# Patient Record
Sex: Female | Born: 1950
Health system: Southern US, Community
[De-identification: ages and names within clinical notes are randomized; demographics above are authoritative.]

## PROBLEM LIST (undated history)

## (undated) DIAGNOSIS — F329 Major depressive disorder, single episode, unspecified: Secondary | ICD-10-CM

## (undated) DIAGNOSIS — F32A Depression, unspecified: Secondary | ICD-10-CM

## (undated) DIAGNOSIS — Z923 Personal history of irradiation: Secondary | ICD-10-CM

## (undated) DIAGNOSIS — E079 Disorder of thyroid, unspecified: Secondary | ICD-10-CM

## (undated) DIAGNOSIS — B1910 Unspecified viral hepatitis B without hepatic coma: Secondary | ICD-10-CM

## (undated) DIAGNOSIS — H269 Unspecified cataract: Secondary | ICD-10-CM

## (undated) DIAGNOSIS — E039 Hypothyroidism, unspecified: Secondary | ICD-10-CM

## (undated) DIAGNOSIS — F411 Generalized anxiety disorder: Secondary | ICD-10-CM

## (undated) DIAGNOSIS — H35379 Puckering of macula, unspecified eye: Secondary | ICD-10-CM

## (undated) DIAGNOSIS — T7840XA Allergy, unspecified, initial encounter: Secondary | ICD-10-CM

## (undated) DIAGNOSIS — E785 Hyperlipidemia, unspecified: Secondary | ICD-10-CM

## (undated) DIAGNOSIS — I10 Essential (primary) hypertension: Secondary | ICD-10-CM

## (undated) DIAGNOSIS — Z820 Family history of epilepsy and other diseases of the nervous system: Secondary | ICD-10-CM

## (undated) DIAGNOSIS — M1990 Unspecified osteoarthritis, unspecified site: Secondary | ICD-10-CM

## (undated) HISTORY — DX: Allergy, unspecified, initial encounter: T78.40XA

## (undated) HISTORY — DX: Puckering of macula, unspecified eye: H35.379

## (undated) HISTORY — DX: Unspecified viral hepatitis B without hepatic coma: B19.10

## (undated) HISTORY — DX: Disorder of thyroid, unspecified: E07.9

## (undated) HISTORY — DX: Family history of epilepsy and other diseases of the nervous system: Z82.0

## (undated) HISTORY — PX: FRACTURE SURGERY: SHX138

## (undated) HISTORY — DX: Essential (primary) hypertension: I10

## (undated) HISTORY — PX: TUBAL LIGATION: SHX77

## (undated) HISTORY — PX: ENDOMETRIAL ABLATION: SHX621

## (undated) HISTORY — DX: Major depressive disorder, single episode, unspecified: F32.9

## (undated) HISTORY — PX: CATARACT EXTRACTION: SUR2

## (undated) HISTORY — DX: Unspecified cataract: H26.9

## (undated) HISTORY — PX: APPENDECTOMY: SHX54

## (undated) HISTORY — PX: OTHER SURGICAL HISTORY: SHX169

## (undated) HISTORY — DX: Unspecified osteoarthritis, unspecified site: M19.90

## (undated) HISTORY — PX: TONSILLECTOMY AND ADENOIDECTOMY: SUR1326

## (undated) HISTORY — DX: Generalized anxiety disorder: F41.1

## (undated) HISTORY — DX: Depression, unspecified: F32.A

## (undated) HISTORY — PX: CHOLECYSTECTOMY: SHX55

## (undated) HISTORY — PX: JOINT REPLACEMENT: SHX530

## (undated) HISTORY — PX: ELBOW SURGERY: SHX618

## (undated) HISTORY — PX: FOOT SURGERY: SHX648

## (undated) HISTORY — PX: EYE SURGERY: SHX253

## (undated) HISTORY — PX: BREAST SURGERY: SHX581

## (undated) HISTORY — PX: RETINAL DETACHMENT SURGERY: SHX105

---
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
## 2007-08-08 LAB — HM COLONOSCOPY

## 2010-11-05 HISTORY — PX: RETINAL DETACHMENT SURGERY: SHX105

## 2014-04-27 ENCOUNTER — Other Ambulatory Visit: Payer: Self-pay

## 2014-04-27 ENCOUNTER — Encounter: Payer: Self-pay | Admitting: Family

## 2014-04-27 ENCOUNTER — Ambulatory Visit (INDEPENDENT_AMBULATORY_CARE_PROVIDER_SITE_OTHER): Payer: BC Managed Care – PPO | Admitting: Family

## 2014-04-27 VITALS — BP 124/80 | HR 71 | Ht 64.0 in | Wt 211.0 lb

## 2014-04-27 DIAGNOSIS — I10 Essential (primary) hypertension: Secondary | ICD-10-CM

## 2014-04-27 DIAGNOSIS — Z7189 Other specified counseling: Secondary | ICD-10-CM

## 2014-04-27 DIAGNOSIS — M159 Polyosteoarthritis, unspecified: Secondary | ICD-10-CM

## 2014-04-27 DIAGNOSIS — M150 Primary generalized (osteo)arthritis: Secondary | ICD-10-CM

## 2014-04-27 DIAGNOSIS — F411 Generalized anxiety disorder: Secondary | ICD-10-CM

## 2014-04-27 DIAGNOSIS — E039 Hypothyroidism, unspecified: Secondary | ICD-10-CM

## 2014-04-27 DIAGNOSIS — Z7689 Persons encountering health services in other specified circumstances: Secondary | ICD-10-CM

## 2014-04-27 DIAGNOSIS — M1990 Unspecified osteoarthritis, unspecified site: Secondary | ICD-10-CM | POA: Insufficient documentation

## 2014-04-27 HISTORY — DX: Essential (primary) hypertension: I10

## 2014-04-27 LAB — POCT URINALYSIS DIPSTICK
BILIRUBIN UA: NEGATIVE
Blood, UA: NEGATIVE
GLUCOSE UA: NEGATIVE
Ketones, UA: NEGATIVE
NITRITE UA: NEGATIVE
Protein, UA: NEGATIVE
Spec Grav, UA: 1.02
Urobilinogen, UA: 0.2
pH, UA: 7

## 2014-04-27 LAB — COMPREHENSIVE METABOLIC PANEL
ALT: 19 U/L (ref 0–35)
AST: 19 U/L (ref 0–37)
Albumin: 4.1 g/dL (ref 3.5–5.2)
Alkaline Phosphatase: 62 U/L (ref 39–117)
BUN: 21 mg/dL (ref 6–23)
CALCIUM: 9.1 mg/dL (ref 8.4–10.5)
CO2: 23 meq/L (ref 19–32)
CREATININE: 0.8 mg/dL (ref 0.4–1.2)
Chloride: 107 mEq/L (ref 96–112)
GFR: 81.94 mL/min (ref >60.00)
GLUCOSE: 114 mg/dL — AB (ref 70–99)
Potassium: 4 mEq/L (ref 3.5–5.1)
Sodium: 138 mEq/L (ref 135–145)
Total Bilirubin: 0.7 mg/dL (ref 0.2–1.2)
Total Protein: 6.9 g/dL (ref 6.0–8.3)

## 2014-04-27 LAB — CBC WITH DIFFERENTIAL/PLATELET
BASOS ABS: 0.1 10*3/uL (ref 0.0–0.1)
Basophils Relative: 0.8 % (ref 0.0–3.0)
Eosinophils Absolute: 0.4 10*3/uL (ref 0.0–0.7)
Eosinophils Relative: 4.2 % (ref 0.0–5.0)
HCT: 38.6 % (ref 36.0–46.0)
HEMOGLOBIN: 12.9 g/dL (ref 12.0–15.0)
LYMPHS PCT: 27 % (ref 12.0–46.0)
Lymphs Abs: 2.2 10*3/uL (ref 0.7–4.0)
MCHC: 33.5 g/dL (ref 30.0–36.0)
MCV: 87.3 fl (ref 78.0–100.0)
MONOS PCT: 5 % (ref 3.0–12.0)
Monocytes Absolute: 0.4 10*3/uL (ref 0.1–1.0)
Neutro Abs: 5.2 10*3/uL (ref 1.4–7.7)
Neutrophils Relative %: 63 % (ref 43.0–77.0)
PLATELETS: 237 10*3/uL (ref 150.0–400.0)
RBC: 4.42 Mil/uL (ref 3.87–5.11)
RDW: 12.9 % (ref 11.5–15.5)
WBC: 8.3 10*3/uL (ref 4.0–10.5)

## 2014-04-27 LAB — LIPID PANEL
CHOLESTEROL: 206 mg/dL — AB (ref 0–200)
HDL: 48.2 mg/dL (ref >39.00)
LDL Cholesterol: 129 mg/dL — ABNORMAL HIGH (ref 0–99)
NonHDL: 157.8
Total CHOL/HDL Ratio: 4
Triglycerides: 144 mg/dL (ref 0.0–149.0)
VLDL: 28.8 mg/dL (ref 0.0–40.0)

## 2014-04-27 LAB — TSH: TSH: 1.77 u[IU]/mL (ref 0.35–4.50)

## 2014-04-27 NOTE — Progress Notes (Signed)
   Subjective:    Patient ID: Karina Allen, female    DOB: 1951/09/06, 63 y.o.   MRN: [id number]  HPI  63 year old nonsmoking Caucasian female presents to establish care. She denies acute pain or injury. Her PMH includes hypothyroidism, OA, hypertension, and depression.   Review of Systems  Constitutional: Negative.   HENT: Negative.   Eyes: Negative.   Respiratory: Negative.   Cardiovascular: Negative.   Gastrointestinal: Negative.   Endocrine: Negative.   Genitourinary: Negative.   Musculoskeletal: Positive for arthralgias.  Skin: Negative.   Allergic/Immunologic: Negative.   Neurological: Negative.   Hematological: Negative.   Psychiatric/Behavioral: Negative.    Past Medical History  Diagnosis Date  . Arthritis     osteo  . Depression   . FH: migraines   . Thyroid disease   . Hepatitis B     History   Social History  . Marital Status: Married    Spouse Name: N/A    Number of Children: N/A  . Years of Education: N/A   Occupational History  . Not on file.   Social History Main Topics  . Smoking status: Never Smoker   . Smokeless tobacco: Not on file  . Alcohol Use: Yes     Comment: socially  . Drug Use: No  . Sexual Activity: Not on file   Other Topics Concern  . Not on file   Social History Narrative  . No narrative on file    Past Surgical History  Procedure Laterality Date  . Cholecystectomy    . Tonsillectomy and adenoidectomy    . Appendectomy    . Cataract extraction    . Retinal detachment surgery    . Elbow surgery      shattered elbow  . Endometrial oblation    . Tubal ligation    . Foot surgery      History reviewed. No pertinent family history.  Allergies  Allergen Reactions  . Penicillins     No current outpatient prescriptions on file prior to visit.   No current facility-administered medications on file prior to visit.    BP 124/80  Pulse 71  Ht 5\' 4"  (1.626 m)  Wt 211 lb (95.709 kg)  BMI 36.20 kg/m2      Objective:   Physical Exam  Constitutional: She is oriented to person, place, and time. She appears well-developed and well-nourished.  HENT:  Head: Normocephalic and atraumatic.  Neck: Normal range of motion.  Cardiovascular: Normal rate, regular rhythm and normal heart sounds.   Pulmonary/Chest: Effort normal and breath sounds normal.  Musculoskeletal: Normal range of motion.  Neurological: She is alert and oriented to person, place, and time.  Skin: Skin is dry.  Psychiatric: She has a normal mood and affect. Her behavior is normal. Judgment and thought content normal.       Assessment & Plan:  Karina Allen was seen today for establish care.  Diagnoses and associated orders for this visit:  Unspecified essential hypertension - CMP  Unspecified hypothyroidism - TSH - Lipid Panel  Anxiety state, unspecified  Primary osteoarthritis involving multiple joints  Encounter to establish care - Cancel: Hepatic Function Panel - CMP - CBC with Differential - Cancel: Urinalysis - POCT urinalysis dipstick    Call the office with any questions or concerns. Recheck as scheduled and as needed.

## 2014-04-27 NOTE — Patient Instructions (Signed)
Exercise to Lose Weight Exercise and a healthy diet may help you lose weight. Your doctor may suggest specific exercises. EXERCISE IDEAS AND TIPS  Choose low-cost things you enjoy doing, such as walking, bicycling, or exercising to workout videos.  Take stairs instead of the elevator.  Walk during your lunch break.  Park your car further away from work or school.  Go to a gym or an exercise class.  Start with 5 to 10 minutes of exercise each day. Build up to 30 minutes of exercise 4 to 6 days a week.  Wear shoes with good support and comfortable clothes.  Stretch before and after working out.  Work out until you breathe harder and your heart beats faster.  Drink extra water when you exercise.  Do not do so much that you hurt yourself, feel dizzy, or get very short of breath. Exercises that burn about 150 calories:  Running 1  miles in 15 minutes.  Playing volleyball for 45 to 60 minutes.  Washing and waxing a car for 45 to 60 minutes.  Playing touch football for 45 minutes.  Walking 1  miles in 35 minutes.  Pushing a stroller 1  miles in 30 minutes.  Playing basketball for 30 minutes.  Raking leaves for 30 minutes.  Bicycling 5 miles in 30 minutes.  Walking 2 miles in 30 minutes.  Dancing for 30 minutes.  Shoveling snow for 15 minutes.  Swimming laps for 20 minutes.  Walking up stairs for 15 minutes.  Bicycling 4 miles in 15 minutes.  Gardening for 30 to 45 minutes.  Jumping rope for 15 minutes.  Washing windows or floors for 45 to 60 minutes. Document Released: 11/24/2010 Document Revised: 01/14/2012 Document Reviewed: 11/24/2010 Exit[employer] Patient Information 2015 [employer]. This information is not intended to replace advice given to you by your health care provider. Make sure you discuss any questions you have with your health care provider.  

## 2014-04-27 NOTE — Progress Notes (Signed)
Pre visit review using our clinic review tool, if applicable. No additional management support is needed unless otherwise documented below in the visit note. 

## 2014-04-28 ENCOUNTER — Telehealth: Payer: Self-pay | Admitting: Family

## 2014-04-28 NOTE — Telephone Encounter (Signed)
Relevant patient education mailed to patient.  

## 2014-05-11 ENCOUNTER — Telehealth: Payer: Self-pay | Admitting: Family

## 2014-05-11 MED ORDER — MELOXICAM 15 MG PO TABS: 15.0000 mg | ORAL_TABLET | Freq: Every day | ORAL | Status: DC

## 2014-05-11 MED ORDER — LEVOTHYROXINE SODIUM 150 MCG PO TABS: 150.0000 ug | ORAL_TABLET | Freq: Every day | ORAL | Status: DC

## 2014-05-11 MED ORDER — ALPRAZOLAM 0.25 MG PO TABS: 0.2500 mg | ORAL_TABLET | Freq: Two times a day (BID) | ORAL | Status: DC | PRN

## 2014-05-11 MED ORDER — OLMESARTAN MEDOXOMIL-HCTZ 20-12.5 MG PO TABS: 1.0000 | ORAL_TABLET | Freq: Every day | ORAL | Status: DC

## 2014-05-11 NOTE — Telephone Encounter (Signed)
[facility] is requesting re-fills on the following: meloxicam (MOBIC) 15 MG tablet ALPRAZolam (XANAX) 0.25 MG tablet SYNTHROID 150 MCG tablet BENICAR HCT 20-12.5 MG per tablet

## 2014-05-11 NOTE — Telephone Encounter (Signed)
Done

## 2014-05-31 ENCOUNTER — Encounter: Payer: Self-pay | Admitting: Family

## 2014-05-31 ENCOUNTER — Ambulatory Visit (INDEPENDENT_AMBULATORY_CARE_PROVIDER_SITE_OTHER): Payer: BC Managed Care – PPO | Admitting: Family

## 2014-05-31 VITALS — BP 122/80 | HR 76 | Ht 64.0 in | Wt 219.0 lb

## 2014-05-31 DIAGNOSIS — R739 Hyperglycemia, unspecified: Secondary | ICD-10-CM

## 2014-05-31 DIAGNOSIS — Z0000 Encounter for general adult medical examination without abnormal findings: Secondary | ICD-10-CM

## 2014-05-31 DIAGNOSIS — I10 Essential (primary) hypertension: Secondary | ICD-10-CM

## 2014-05-31 DIAGNOSIS — R7309 Other abnormal glucose: Secondary | ICD-10-CM

## 2014-05-31 DIAGNOSIS — E039 Hypothyroidism, unspecified: Secondary | ICD-10-CM

## 2014-05-31 NOTE — Progress Notes (Signed)
Pre visit review using our clinic review tool, if applicable. No additional management support is needed unless otherwise documented below in the visit note. 

## 2014-05-31 NOTE — Progress Notes (Signed)
Subjective:    Patient ID: Karina Allen, female    DOB: 10-04-1951, 63 y.o.   MRN: [id number]  HPI 63 year old nonsmoker, with a history of hypertension, anxiety, hypothyroidism, osteoarthritis, and hyperglycemia. Reports doing well but not exercising. I reviewed all health maintenance protocols including mammography, colonoscopy, bone density Needed referrals were placed. Age and diagnosis  appropriate screening labs were ordered. Her immunization history was reviewed and appropriate vaccinations were ordered. Her current medications and allergies were reviewed and needed refills of her chronic medications were ordered. The plan for yearly health maintenance was discussed all orders and referrals were made as appropriate.   Review of Systems  Constitutional: Negative.   HENT: Negative.   Eyes: Negative.   Respiratory: Negative.   Cardiovascular: Negative.   Gastrointestinal: Negative.   Endocrine: Negative.   Genitourinary: Negative.   Musculoskeletal: Negative.   Skin: Negative.   Allergic/Immunologic: Negative.   Neurological: Negative.   Hematological: Negative.   Psychiatric/Behavioral: Negative.    Past Medical History  Diagnosis Date  . Arthritis     osteo  . Depression   . FH: migraines   . Thyroid disease   . Hepatitis B     History   Social History  . Marital Status: Married    Spouse Name: N/A    Number of Children: N/A  . Years of Education: N/A   Occupational History  . Not on file.   Social History Main Topics  . Smoking status: Never Smoker   . Smokeless tobacco: Not on file  . Alcohol Use: Yes     Comment: socially  . Drug Use: No  . Sexual Activity: Not on file   Other Topics Concern  . Not on file   Social History Narrative  . No narrative on file    Past Surgical History  Procedure Laterality Date  . Cholecystectomy    . Tonsillectomy and adenoidectomy    . Appendectomy    . Cataract extraction    . Retinal detachment surgery    .  Elbow surgery      shattered elbow  . Endometrial oblation    . Tubal ligation    . Foot surgery      No family history on file.  Allergies  Allergen Reactions  . Penicillins     Current Outpatient Prescriptions on File Prior to Visit  Medication Sig Dispense Refill  . ALPRAZolam (XANAX) 0.25 MG tablet Take 1 tablet (0.25 mg total) by mouth 2 (two) times daily as needed.  30 tablet  0  . levothyroxine (SYNTHROID) 150 MCG tablet Take 1 tablet (150 mcg total) by mouth daily before breakfast.  90 tablet  1  . meloxicam (MOBIC) 15 MG tablet Take 1 tablet (15 mg total) by mouth daily.  90 tablet  1  . olmesartan-hydrochlorothiazide (BENICAR HCT) 20-12.5 MG per tablet Take 1 tablet by mouth daily.  90 tablet  1   No current facility-administered medications on file prior to visit.    BP 122/80  Pulse 76  Ht 5\' 4"  (1.626 m)  Wt 219 lb (99.338 kg)  BMI 37.57 kg/m2chart    Objective:   Physical Exam  Constitutional: She is oriented to person, place, and time. She appears well-developed and well-nourished.  HENT:  Head: Normocephalic and atraumatic.  Right Ear: External ear normal.  Left Ear: External ear normal.  Nose: Nose normal.  Mouth/Throat: Oropharynx is clear and moist.  Eyes: Conjunctivae and EOM are normal. Pupils are  equal, round, and reactive to light.  Neck: Normal range of motion. Neck supple.  Cardiovascular: Normal rate, regular rhythm and normal heart sounds.   Pulmonary/Chest: Effort normal and breath sounds normal.  Abdominal: Soft. Bowel sounds are normal.  Musculoskeletal: Normal range of motion. She exhibits no edema and no tenderness.  Neurological: She is alert and oriented to person, place, and time. She has normal reflexes. She displays normal reflexes. No cranial nerve deficit. Coordination normal.  Skin: Skin is warm and dry.  Psychiatric: She has a normal mood and affect.          Assessment & Plan:  Rayn was seen today for annual  exam.  Diagnoses and associated orders for this visit:  Routine general medical examination at a health care facility - EKG 12-Lead  Hyperglycemia  Unspecified hypothyroidism  Unspecified essential hypertension   Return for a recheck of hyperglycemia. Encouraged a healthy diet, exercise, weight reduction.

## 2014-05-31 NOTE — Patient Instructions (Signed)
Exercise to Lose Weight Exercise and a healthy diet may help you lose weight. Your doctor may suggest specific exercises. EXERCISE IDEAS AND TIPS  Choose low-cost things you enjoy doing, such as walking, bicycling, or exercising to workout videos.  Take stairs instead of the elevator.  Walk during your lunch break.  Park your car further away from work or school.  Go to a gym or an exercise class.  Start with 5 to 10 minutes of exercise each day. Build up to 30 minutes of exercise 4 to 6 days a week.  Wear shoes with good support and comfortable clothes.  Stretch before and after working out.  Work out until you breathe harder and your heart beats faster.  Drink extra water when you exercise.  Do not do so much that you hurt yourself, feel dizzy, or get very short of breath. Exercises that burn about 150 calories:  Running 1  miles in 15 minutes.  Playing volleyball for 45 to 60 minutes.  Washing and waxing a car for 45 to 60 minutes.  Playing touch football for 45 minutes.  Walking 1  miles in 35 minutes.  Pushing a stroller 1  miles in 30 minutes.  Playing basketball for 30 minutes.  Raking leaves for 30 minutes.  Bicycling 5 miles in 30 minutes.  Walking 2 miles in 30 minutes.  Dancing for 30 minutes.  Shoveling snow for 15 minutes.  Swimming laps for 20 minutes.  Walking up stairs for 15 minutes.  Bicycling 4 miles in 15 minutes.  Gardening for 30 to 45 minutes.  Jumping rope for 15 minutes.  Washing windows or floors for 45 to 60 minutes. Document Released: 11/24/2010 Document Revised: 01/14/2012 Document Reviewed: 11/24/2010 Exit[employer] Patient Information 2015 [employer]. This information is not intended to replace advice given to you by your health care provider. Make sure you discuss any questions you have with your health care provider.  

## 2014-06-30 ENCOUNTER — Telehealth: Payer: Self-pay

## 2014-06-30 MED ORDER — ALPRAZOLAM 0.25 MG PO TABS: 0.2500 mg | ORAL_TABLET | Freq: Two times a day (BID) | ORAL | Status: DC | PRN

## 2014-06-30 NOTE — Telephone Encounter (Signed)
Refill request for Xanax 0.25mg . Last filled 05/11/14. Ok to fill?

## 2014-06-30 NOTE — Telephone Encounter (Signed)
Pt is aware and verbalized understanding.

## 2014-06-30 NOTE — Telephone Encounter (Signed)
Ok to refill but advise patient this medication is only meant to be used sparingly.

## 2014-10-01 ENCOUNTER — Ambulatory Visit: Payer: BC Managed Care – PPO | Admitting: Family

## 2014-10-12 ENCOUNTER — Ambulatory Visit (INDEPENDENT_AMBULATORY_CARE_PROVIDER_SITE_OTHER): Payer: BC Managed Care – PPO | Admitting: Family

## 2014-10-12 ENCOUNTER — Encounter: Payer: Self-pay | Admitting: Family

## 2014-10-12 VITALS — BP 126/84 | HR 88 | Wt 224.8 lb

## 2014-10-12 DIAGNOSIS — Z1231 Encounter for screening mammogram for malignant neoplasm of breast: Secondary | ICD-10-CM

## 2014-10-12 DIAGNOSIS — I10 Essential (primary) hypertension: Secondary | ICD-10-CM

## 2014-10-12 DIAGNOSIS — Z1239 Encounter for other screening for malignant neoplasm of breast: Secondary | ICD-10-CM

## 2014-10-12 DIAGNOSIS — M1710 Unilateral primary osteoarthritis, unspecified knee: Secondary | ICD-10-CM

## 2014-10-12 DIAGNOSIS — E039 Hypothyroidism, unspecified: Secondary | ICD-10-CM

## 2014-10-12 DIAGNOSIS — R739 Hyperglycemia, unspecified: Secondary | ICD-10-CM

## 2014-10-12 DIAGNOSIS — F411 Generalized anxiety disorder: Secondary | ICD-10-CM

## 2014-10-12 DIAGNOSIS — IMO0002 Reserved for concepts with insufficient information to code with codable children: Secondary | ICD-10-CM

## 2014-10-12 DIAGNOSIS — M179 Osteoarthritis of knee, unspecified: Secondary | ICD-10-CM

## 2014-10-12 LAB — HEMOGLOBIN A1C: Hgb A1c MFr Bld: 6.2 % (ref 4.6–6.5)

## 2014-10-12 LAB — BASIC METABOLIC PANEL
BUN: 13 mg/dL (ref 6–23)
CHLORIDE: 103 meq/L (ref 96–112)
CO2: 24 meq/L (ref 19–32)
Calcium: 9.3 mg/dL (ref 8.4–10.5)
Creatinine, Ser: 0.8 mg/dL (ref 0.4–1.2)
GFR: 72.89 mL/min (ref >60.00)
GLUCOSE: 108 mg/dL — AB (ref 70–99)
POTASSIUM: 3.8 meq/L (ref 3.5–5.1)
Sodium: 136 mEq/L (ref 135–145)

## 2014-10-12 LAB — CBC WITH DIFFERENTIAL/PLATELET
BASOS ABS: 0.1 10*3/uL (ref 0.0–0.1)
BASOS PCT: 1.1 % (ref 0.0–3.0)
Eosinophils Absolute: 0.3 10*3/uL (ref 0.0–0.7)
Eosinophils Relative: 4 % (ref 0.0–5.0)
HCT: 38.6 % (ref 36.0–46.0)
HEMOGLOBIN: 13.1 g/dL (ref 12.0–15.0)
LYMPHS PCT: 23.9 % (ref 12.0–46.0)
Lymphs Abs: 1.9 10*3/uL (ref 0.7–4.0)
MCHC: 33.9 g/dL (ref 30.0–36.0)
MCV: 85.3 fl (ref 78.0–100.0)
MONO ABS: 0.4 10*3/uL (ref 0.1–1.0)
Monocytes Relative: 5.2 % (ref 3.0–12.0)
NEUTROS ABS: 5.1 10*3/uL (ref 1.4–7.7)
NEUTROS PCT: 65.8 % (ref 43.0–77.0)
Platelets: 264 10*3/uL (ref 150.0–400.0)
RBC: 4.53 Mil/uL (ref 3.87–5.11)
RDW: 13.3 % (ref 11.5–15.5)
WBC: 7.8 10*3/uL (ref 4.0–10.5)

## 2014-10-12 LAB — HEPATIC FUNCTION PANEL
ALBUMIN: 3.9 g/dL (ref 3.5–5.2)
ALK PHOS: 68 U/L (ref 39–117)
ALT: 23 U/L (ref 0–35)
AST: 22 U/L (ref 0–37)
Bilirubin, Direct: 0.1 mg/dL (ref 0.0–0.3)
TOTAL PROTEIN: 6.7 g/dL (ref 6.0–8.3)
Total Bilirubin: 0.7 mg/dL (ref 0.2–1.2)

## 2014-10-12 LAB — TSH: TSH: 3.24 u[IU]/mL (ref 0.35–4.50)

## 2014-10-12 MED ORDER — LORAZEPAM 0.5 MG PO TABS: 0.5000 mg | ORAL_TABLET | Freq: Two times a day (BID) | ORAL | Status: DC | PRN

## 2014-10-12 NOTE — Patient Instructions (Signed)
Exercise to Lose Weight Exercise and a healthy diet may help you lose weight. Your doctor may suggest specific exercises. EXERCISE IDEAS AND TIPS  Choose low-cost things you enjoy doing, such as walking, bicycling, or exercising to workout videos.  Take stairs instead of the elevator.  Walk during your lunch break.  Park your car further away from work or school.  Go to a gym or an exercise class.  Start with 5 to 10 minutes of exercise each day. Build up to 30 minutes of exercise 4 to 6 days a week.  Wear shoes with good support and comfortable clothes.  Stretch before and after working out.  Work out until you breathe harder and your heart beats faster.  Drink extra water when you exercise.  Do not do so much that you hurt yourself, feel dizzy, or get very short of breath. Exercises that burn about 150 calories:  Running 1  miles in 15 minutes.  Playing volleyball for 45 to 60 minutes.  Washing and waxing a car for 45 to 60 minutes.  Playing touch football for 45 minutes.  Walking 1  miles in 35 minutes.  Pushing a stroller 1  miles in 30 minutes.  Playing basketball for 30 minutes.  Raking leaves for 30 minutes.  Bicycling 5 miles in 30 minutes.  Walking 2 miles in 30 minutes.  Dancing for 30 minutes.  Shoveling snow for 15 minutes.  Swimming laps for 20 minutes.  Walking up stairs for 15 minutes.  Bicycling 4 miles in 15 minutes.  Gardening for 30 to 45 minutes.  Jumping rope for 15 minutes.  Washing windows or floors for 45 to 60 minutes. Document Released: 11/24/2010 Document Revised: 01/14/2012 Document Reviewed: 11/24/2010 Exit[employer] Patient Information 2015 [employer]. This information is not intended to replace advice given to you by your health care provider. Make sure you discuss any questions you have with your health care provider.  

## 2014-10-12 NOTE — Progress Notes (Signed)
Subjective:    Patient ID: Karina Allen, female    DOB: [birth year], 63 y.o.   MRN: [id number]  HPI 63 year old white female, obese with a history of hypertension, hyperglycemia, anxiety, and osteoarthritis is in today for recheck. Reports doing well. Has gained 5 pounds from her last office visit. Denies any physical exercise. Tolerates medications well. Reports increased stress and periodically takes Xanax as needed. Mother was recently in the hospital after an ablation that did not go well.   Review of Systems  Constitutional: Negative.   HENT: Negative.   Respiratory: Negative.   Cardiovascular: Negative.   Gastrointestinal: Negative.   Endocrine: Negative.   Genitourinary: Negative.   Musculoskeletal: Positive for arthralgias. Negative for back pain.  Skin: Negative.   Neurological: Negative.   Hematological: Negative.   Psychiatric/Behavioral: Negative.    Past Medical History  Diagnosis Date  . Arthritis     osteo  . Depression   . FH: migraines   . Thyroid disease   . Hepatitis B     History   Social History  . Marital Status: Married    Spouse Name: N/A    Number of Children: N/A  . Years of Education: N/A   Occupational History  . Not on file.   Social History Main Topics  . Smoking status: Never Smoker   . Smokeless tobacco: Not on file  . Alcohol Use: Yes     Comment: socially  . Drug Use: No  . Sexual Activity: Not on file   Other Topics Concern  . Not on file   Social History Narrative    Past Surgical History  Procedure Laterality Date  . Cholecystectomy    . Tonsillectomy and adenoidectomy    . Appendectomy    . Cataract extraction    . Retinal detachment surgery    . Elbow surgery      shattered elbow  . Endometrial oblation    . Tubal ligation    . Foot surgery      No family history on file.  Allergies  Allergen Reactions  . Penicillins     Current Outpatient Prescriptions on File Prior to Visit  Medication Sig Dispense  Refill  . levothyroxine (SYNTHROID) 150 MCG tablet Take 1 tablet (150 mcg total) by mouth daily before breakfast. 90 tablet 1  . meloxicam (MOBIC) 15 MG tablet Take 1 tablet (15 mg total) by mouth daily. 90 tablet 1  . olmesartan-hydrochlorothiazide (BENICAR HCT) 20-12.5 MG per tablet Take 1 tablet by mouth daily. 90 tablet 1   No current facility-administered medications on file prior to visit.    BP 126/84 mmHg  Pulse 88  Wt 224 lb 12.8 oz (101.969 kg)chart    Objective:   Physical Exam  Constitutional: She is oriented to person, place, and time. She appears well-developed and well-nourished.  HENT:  Right Ear: External ear normal.  Left Ear: External ear normal.  Nose: Nose normal.  Mouth/Throat: Oropharynx is clear and moist.  Neck: Normal range of motion. Neck supple. No thyromegaly present.  Cardiovascular: Normal rate and normal heart sounds.   Pulmonary/Chest: Effort normal and breath sounds normal.  Abdominal: Soft. Bowel sounds are normal.  Musculoskeletal: Normal range of motion.  Neurological: She is alert and oriented to person, place, and time.  Skin: Skin is warm and dry.  Psychiatric: She has a normal mood and affect.          Assessment & Plan:  Dereon was seen today for follow-up.  Diagnoses and associated orders for this visit:  Hypothyroidism, unspecified hypothyroidism type - TSH - Basic Metabolic Panel - Hepatic Function Panel - CBC with Differential  Osteoarthrosis, unspecified whether generalized or localized, involving lower leg - CBC with Differential  Essential hypertension - Basic Metabolic Panel - Hepatic Function Panel  Hyperglycemia - Hemoglobin A1c - CBC with Differential  Breast cancer screening, high risk patient - MM Digital Screening; Future  Generalized anxiety disorder  Other Orders - LORazepam (ATIVAN) 0.5 MG tablet; Take 1 tablet (0.5 mg total) by mouth 2 (two) times daily as needed for anxiety.    Encouraged  healthy diet, exercise and weight reduction. The goal is to lose 15 pounds over the next 4 months. Discontinue Xanax.Start Lorazepam due to the potential for long-term dependence. Patient aware that this risk also exists with lorazepam. If she needs the medication more than once a day we will have to consider an SSRI.

## 2014-10-12 NOTE — Progress Notes (Signed)
Pre visit review using our clinic review tool, if applicable. No additional management support is needed unless otherwise documented below in the visit note. 

## 2014-10-14 ENCOUNTER — Telehealth: Payer: Self-pay | Admitting: Family

## 2014-10-14 NOTE — Telephone Encounter (Signed)
emmi emailed °

## 2014-11-03 ENCOUNTER — Other Ambulatory Visit: Payer: Self-pay | Admitting: Family

## 2014-11-16 ENCOUNTER — Other Ambulatory Visit: Payer: Self-pay | Admitting: Family

## 2014-11-22 ENCOUNTER — Ambulatory Visit
Admission: RE | Admit: 2014-11-22 | Discharge: 2014-11-22 | Disposition: A | Discharge status: Home / Self Care | Payer: BC Managed Care – PPO | Source: Ambulatory Visit | Attending: Family | Admitting: Family

## 2014-11-22 DIAGNOSIS — Z1239 Encounter for other screening for malignant neoplasm of breast: Secondary | ICD-10-CM

## 2014-11-24 ENCOUNTER — Other Ambulatory Visit: Payer: Self-pay | Admitting: Family

## 2014-11-24 DIAGNOSIS — R928 Other abnormal and inconclusive findings on diagnostic imaging of breast: Secondary | ICD-10-CM

## 2014-12-01 ENCOUNTER — Ambulatory Visit
Admission: RE | Admit: 2014-12-01 | Discharge: 2014-12-01 | Disposition: A | Discharge status: Home / Self Care | Payer: BC Managed Care – PPO | Source: Ambulatory Visit | Attending: Family | Admitting: Family

## 2014-12-01 DIAGNOSIS — R928 Other abnormal and inconclusive findings on diagnostic imaging of breast: Secondary | ICD-10-CM

## 2015-02-14 ENCOUNTER — Ambulatory Visit: Payer: BC Managed Care – PPO | Admitting: Family

## 2015-02-15 ENCOUNTER — Other Ambulatory Visit: Payer: Self-pay | Admitting: Family

## 2015-02-24 ENCOUNTER — Ambulatory Visit (INDEPENDENT_AMBULATORY_CARE_PROVIDER_SITE_OTHER): Payer: BC Managed Care – PPO | Admitting: Family

## 2015-02-24 ENCOUNTER — Encounter: Payer: Self-pay | Admitting: Family

## 2015-02-24 VITALS — BP 120/84 | HR 85 | Temp 97.6°F | Wt 230.0 lb

## 2015-02-24 DIAGNOSIS — E7800 Pure hypercholesterolemia, unspecified: Secondary | ICD-10-CM

## 2015-02-24 DIAGNOSIS — E039 Hypothyroidism, unspecified: Secondary | ICD-10-CM | POA: Diagnosis not present

## 2015-02-24 DIAGNOSIS — R739 Hyperglycemia, unspecified: Secondary | ICD-10-CM | POA: Diagnosis not present

## 2015-02-24 DIAGNOSIS — F411 Generalized anxiety disorder: Secondary | ICD-10-CM | POA: Diagnosis not present

## 2015-02-24 DIAGNOSIS — I10 Essential (primary) hypertension: Secondary | ICD-10-CM | POA: Diagnosis not present

## 2015-02-24 LAB — LIPID PANEL
Cholesterol: 188 mg/dL (ref 0–200)
HDL: 41.8 mg/dL (ref >39.00)
LDL Cholesterol: 114 mg/dL — ABNORMAL HIGH (ref 0–99)
NonHDL: 146.2
Total CHOL/HDL Ratio: 4
Triglycerides: 163 mg/dL — ABNORMAL HIGH (ref 0.0–149.0)
VLDL: 32.6 mg/dL (ref 0.0–40.0)

## 2015-02-24 LAB — BASIC METABOLIC PANEL
BUN: 16 mg/dL (ref 6–23)
CALCIUM: 9.4 mg/dL (ref 8.4–10.5)
CO2: 25 mEq/L (ref 19–32)
Chloride: 106 mEq/L (ref 96–112)
Creatinine, Ser: 1.02 mg/dL (ref 0.40–1.20)
GFR: 58.19 mL/min — AB (ref >60.00)
GLUCOSE: 110 mg/dL — AB (ref 70–99)
POTASSIUM: 4.1 meq/L (ref 3.5–5.1)
Sodium: 138 mEq/L (ref 135–145)

## 2015-02-24 LAB — HEPATIC FUNCTION PANEL
ALK PHOS: 66 U/L (ref 39–117)
ALT: 22 U/L (ref 0–35)
AST: 21 U/L (ref 0–37)
Albumin: 3.9 g/dL (ref 3.5–5.2)
BILIRUBIN DIRECT: 0.1 mg/dL (ref 0.0–0.3)
BILIRUBIN TOTAL: 0.4 mg/dL (ref 0.2–1.2)
Total Protein: 6.7 g/dL (ref 6.0–8.3)

## 2015-02-24 LAB — HEMOGLOBIN A1C: Hgb A1c MFr Bld: 5.8 % (ref 4.6–6.5)

## 2015-02-24 LAB — TSH: TSH: 1.83 u[IU]/mL (ref 0.35–4.50)

## 2015-02-24 NOTE — Patient Instructions (Signed)
Exercise to Lose Weight Exercise and a healthy diet may help you lose weight. Your doctor may suggest specific exercises. EXERCISE IDEAS AND TIPS  Choose low-cost things you enjoy doing, such as walking, bicycling, or exercising to workout videos.  Take stairs instead of the elevator.  Walk during your lunch break.  Park your car further away from work or school.  Go to a gym or an exercise class.  Start with 5 to 10 minutes of exercise each day. Build up to 30 minutes of exercise 4 to 6 days a week.  Wear shoes with good support and comfortable clothes.  Stretch before and after working out.  Work out until you breathe harder and your heart beats faster.  Drink extra water when you exercise.  Do not do so much that you hurt yourself, feel dizzy, or get very short of breath. Exercises that burn about 150 calories:  Running 1  miles in 15 minutes.  Playing volleyball for 45 to 60 minutes.  Washing and waxing a car for 45 to 60 minutes.  Playing touch football for 45 minutes.  Walking 1  miles in 35 minutes.  Pushing a stroller 1  miles in 30 minutes.  Playing basketball for 30 minutes.  Raking leaves for 30 minutes.  Bicycling 5 miles in 30 minutes.  Walking 2 miles in 30 minutes.  Dancing for 30 minutes.  Shoveling snow for 15 minutes.  Swimming laps for 20 minutes.  Walking up stairs for 15 minutes.  Bicycling 4 miles in 15 minutes.  Gardening for 30 to 45 minutes.  Jumping rope for 15 minutes.  Washing windows or floors for 45 to 60 minutes. Document Released: 11/24/2010 Document Revised: 01/14/2012 Document Reviewed: 11/24/2010 Exit[employer] Patient Information 2015 [employer]. This information is not intended to replace advice given to you by your health care provider. Make sure you discuss any questions you have with your health care provider.  

## 2015-02-24 NOTE — Progress Notes (Signed)
Pre visit review using our clinic review tool, if applicable. No additional management support is needed unless otherwise documented below in the visit note. 

## 2015-02-24 NOTE — Progress Notes (Signed)
Subjective:    Patient ID: Karina Allen, female    DOB: January 11, 1951, 64 y.o.   MRN: [id number]  HPI  64 year old white female with a history of hyperglycemia, hypothyroidism, anxiety, osteoarthritis, and obesity is in today for recheck. She is up 15 pounds. Patient feels that meloxicam has contributed to her weight gain. She stopped the medication. Has been taken ibuprofen that works well. Reports walking 2 miles per day. Reports a healthy diet.  Review of Systems  Constitutional: Positive for unexpected weight change.       Weight gain   HENT: Negative.   Eyes: Negative.   Respiratory: Negative.   Cardiovascular: Negative.   Gastrointestinal: Negative.   Endocrine: Negative.   Genitourinary: Negative.   Musculoskeletal: Negative.   Skin: Negative.   Allergic/Immunologic: Negative.   Neurological: Negative.   Hematological: Negative.   Psychiatric/Behavioral: Negative.    Past Medical History  Diagnosis Date  . Arthritis     osteo  . Depression   . FH: migraines   . Thyroid disease   . Hepatitis B     History   Social History  . Marital Status: Married    Spouse Name: N/A  . Number of Children: N/A  . Years of Education: N/A   Occupational History  . Not on file.   Social History Main Topics  . Smoking status: Never Smoker   . Smokeless tobacco: Not on file  . Alcohol Use: Yes     Comment: socially  . Drug Use: No  . Sexual Activity: Not on file   Other Topics Concern  . Not on file   Social History Narrative    Past Surgical History  Procedure Laterality Date  . Cholecystectomy    . Tonsillectomy and adenoidectomy    . Appendectomy    . Cataract extraction    . Retinal detachment surgery    . Elbow surgery      shattered elbow  . Endometrial oblation    . Tubal ligation    . Foot surgery      No family history on file.  Allergies  Allergen Reactions  . Penicillins     Current Outpatient Prescriptions on File Prior to Visit  Medication  Sig Dispense Refill  . BENICAR HCT 20-12.5 MG per tablet TAKE 1 TABLET BY MOUTH ONCE DAILY. 90 tablet 1  . LORazepam (ATIVAN) 0.5 MG tablet Take 1 tablet (0.5 mg total) by mouth 2 (two) times daily as needed for anxiety. 60 tablet 1  . SYNTHROID 150 MCG tablet TAKE 1 TABLET (150 MCG TOTAL) BY MOUTH ONCE DAILY BEFORE BREAKFAST. 90 tablet 0   No current facility-administered medications on file prior to visit.    BP 120/84 mmHg  Pulse 85  Temp(Src) 97.6 F (36.4 C) (Oral)  Wt 230 lb (104.327 kg)chart     Objective:   Physical Exam  Constitutional: She is oriented to person, place, and time. She appears well-developed and well-nourished.  HENT:  Right Ear: External ear normal.  Left Ear: External ear normal.  Nose: Nose normal.  Mouth/Throat: Oropharynx is clear and moist.  Eyes: Conjunctivae are normal. Pupils are equal, round, and reactive to light.  Neck: Normal range of motion. Neck supple. No thyromegaly present.  Cardiovascular: Normal rate, regular rhythm and normal heart sounds.   Pulmonary/Chest: Effort normal and breath sounds normal.  Abdominal: Soft. Bowel sounds are normal.  Musculoskeletal: Normal range of motion.  Neurological: She is alert and oriented to person, place, and  time. She has normal reflexes.  Skin: Skin is warm and dry.  Psychiatric: She has a normal mood and affect.          Assessment & Plan:  Karina Allen was seen today for follow-up.  Diagnoses and all orders for this visit:  Hypothyroidism, unspecified hypothyroidism type Orders: -     TSH -     Basic Metabolic Panel  Essential hypertension Orders: -     Basic Metabolic Panel  Generalized anxiety disorder  Hyperglycemia Orders: -     Basic Metabolic Panel -     Hemoglobin A1c  Pure hypercholesterolemia Orders: -     Lipid Panel -     Basic Metabolic Panel -     Hepatic Function Panel   Encouraged healthy diet and exercise. Continue current medications. Labs obtained today will  follow-up pending results.

## 2015-03-18 ENCOUNTER — Other Ambulatory Visit: Payer: Self-pay | Admitting: Family

## 2015-04-19 ENCOUNTER — Telehealth: Payer: Self-pay | Admitting: Family

## 2015-04-19 NOTE — Telephone Encounter (Signed)
Padonda, please advise which medication pt should switch to if she is comfortable with the switch

## 2015-04-19 NOTE — Telephone Encounter (Signed)
I called to submit PA for Benicar HCT and was advised Losartan HCT, Valsartan HCT and Telmisartan HCT are preferred medications. Please advise.

## 2015-04-19 NOTE — Telephone Encounter (Signed)
MyChart message sent to pt concerning Benicar

## 2015-04-21 NOTE — Telephone Encounter (Signed)
Patient stated she was not able to tolerate generic several years ago and wants to stay on Benicar HCT.  PA was submitted and approved till 04/20/16.

## 2015-05-20 ENCOUNTER — Other Ambulatory Visit: Payer: Self-pay | Admitting: Family

## 2015-05-20 NOTE — Telephone Encounter (Signed)
Rx sent in

## 2015-06-21 ENCOUNTER — Other Ambulatory Visit: Payer: Self-pay | Admitting: Family

## 2015-07-20 ENCOUNTER — Ambulatory Visit: Payer: BC Managed Care – PPO | Admitting: Family Medicine

## 2015-08-04 ENCOUNTER — Ambulatory Visit (INDEPENDENT_AMBULATORY_CARE_PROVIDER_SITE_OTHER): Payer: BC Managed Care – PPO | Admitting: Family Medicine

## 2015-08-04 ENCOUNTER — Encounter: Payer: Self-pay | Admitting: Family Medicine

## 2015-08-04 VITALS — BP 116/78 | HR 80 | Temp 97.5°F | Ht 64.0 in | Wt 224.4 lb

## 2015-08-04 DIAGNOSIS — Z7189 Other specified counseling: Secondary | ICD-10-CM

## 2015-08-04 DIAGNOSIS — E7800 Pure hypercholesterolemia, unspecified: Secondary | ICD-10-CM

## 2015-08-04 DIAGNOSIS — I10 Essential (primary) hypertension: Secondary | ICD-10-CM

## 2015-08-04 DIAGNOSIS — F411 Generalized anxiety disorder: Secondary | ICD-10-CM | POA: Insufficient documentation

## 2015-08-04 DIAGNOSIS — E038 Other specified hypothyroidism: Secondary | ICD-10-CM

## 2015-08-04 DIAGNOSIS — R739 Hyperglycemia, unspecified: Secondary | ICD-10-CM

## 2015-08-04 DIAGNOSIS — Z7689 Persons encountering health services in other specified circumstances: Secondary | ICD-10-CM

## 2015-08-04 DIAGNOSIS — M170 Bilateral primary osteoarthritis of knee: Secondary | ICD-10-CM

## 2015-08-04 DIAGNOSIS — E669 Obesity, unspecified: Secondary | ICD-10-CM

## 2015-08-04 HISTORY — DX: Generalized anxiety disorder: F41.1

## 2015-08-04 LAB — BASIC METABOLIC PANEL
BUN: 15 mg/dL (ref 6–23)
CHLORIDE: 102 meq/L (ref 96–112)
CO2: 30 mEq/L (ref 19–32)
CREATININE: 0.81 mg/dL (ref 0.40–1.20)
Calcium: 9.4 mg/dL (ref 8.4–10.5)
GFR: 75.82 mL/min (ref >60.00)
Glucose, Bld: 103 mg/dL — ABNORMAL HIGH (ref 70–99)
Potassium: 3.8 mEq/L (ref 3.5–5.1)
Sodium: 138 mEq/L (ref 135–145)

## 2015-08-04 LAB — LIPID PANEL
CHOLESTEROL: 206 mg/dL — AB (ref 0–200)
HDL: 46.4 mg/dL (ref >39.00)
LDL CALC: 127 mg/dL — AB (ref 0–99)
NONHDL: 159.31
Total CHOL/HDL Ratio: 4
Triglycerides: 163 mg/dL — ABNORMAL HIGH (ref 0.0–149.0)
VLDL: 32.6 mg/dL (ref 0.0–40.0)

## 2015-08-04 LAB — TSH: TSH: 1.89 u[IU]/mL (ref 0.35–4.50)

## 2015-08-04 LAB — HEMOGLOBIN A1C: Hgb A1c MFr Bld: 5.9 % (ref 4.6–6.5)

## 2015-08-04 MED ORDER — LORAZEPAM 0.5 MG PO TABS: 0.5000 mg | ORAL_TABLET | Freq: Two times a day (BID) | ORAL | Status: DC | PRN

## 2015-08-04 NOTE — Patient Instructions (Addendum)
BEFORE YOU LEAVE: -labs -schedule follow up in 4 months  Please call to set up counseling regarding the anxiety  -We placed a referral for you as discussed per your request to the orthopedic doctor regarding your knee pain. It usually takes about 1-2 weeks to process and schedule this referral. If you have not heard from [place] regarding this appointment in 2 weeks please contact our office.  We recommend the following healthy lifestyle measures: - eat a healthy whole foods diet consisting of regular small meals composed of vegetables, fruits, beans, nuts, seeds, healthy meats such as white chicken and fish and whole grains.  - avoid sweets, white starchy foods, fried foods, fast food, processed foods, sodas, red meet and other fattening foods.  - get a least 150-300 minutes of aerobic exercise per week.   -We have ordered labs or studies at this visit. It can take up to 1-2 weeks for results and processing. We will contact you with instructions IF your results are abnormal. Normal results will be released to your [facility]. If you have not heard from [place] or can not find your results in [facility] in 2 weeks please contact our office.   -please let us know the name of the gastroenterologist who did your colonosocpy

## 2015-08-04 NOTE — Progress Notes (Signed)
HPI:  Karina Allen is here to establish care. Used to see [employer].  Has the following chronic problems that require follow up and concerns today:  HTN: -meds: benicar-hctz -stable  HLD/Obesity/Prediabetes: -has made some changes in lifestyle -walking; trying to eat healthier, feels has lost a ew pounds  Hypothyroidism: -denies hx of RIA or thyroid surgery -meds: synthroid -stable  Bilateral knee pain/OA: -reports prior PCP did eval > 5 years ago and she was told has sig arthritis of the knees -she has tried various daily nsaids for this -she is adamant about seeing an orthopedic specialist regarding this and requests referral today -denies: fall, weakness, numbness, malaise, fevers  GAD: -worries a little about everything, always -occ panic attacks and request ativan refill for this - understands risks of benzos and reports uses very infrequently -has seen her pastor about this but has not done formal CBT -denies current depression but has hx of this   ROS negative for unless reported above: fevers, unintentional weight loss, hearing or vision loss, chest pain, palpitations, struggling to breath, hemoptysis, melena, hematochezia, hematuria, falls, loc, si, thoughts of self harm  Past Medical History  Diagnosis Date  . Arthritis     osteo  . Depression   . FH: migraines   . Thyroid disease   . Hepatitis B     reports dx and tx as a young child, reports no issues since and retested as adult and told did not have hepatitis, she is unsure of type of hep she had  . Essential hypertension 04/27/2014  . GAD (generalized anxiety disorder) 08/04/2015    Past Surgical History  Procedure Laterality Date  . Cholecystectomy    . Tonsillectomy and adenoidectomy    . Appendectomy    . Cataract extraction    . Retinal detachment surgery    . Elbow surgery      shattered elbow  . Endometrial oblation    . Tubal ligation    . Foot surgery      History reviewed. No  pertinent family history.  Social History   Social History  . Marital Status: Married    Spouse Name: N/A  . Number of Children: N/A  . Years of Education: N/A   Social History Main Topics  . Smoking status: Never Smoker   . Smokeless tobacco: None  . Alcohol Use: Yes     Comment: socially  . Drug Use: No  . Sexual Activity: Not Asked   Other Topics Concern  . 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  BENICAR HCT 20-12.5 MG per tablet, TAKE 1 TABLET BY MOUTH ONCE DAILY., Disp: 30 tablet, Rfl: 1 .  LORazepam (ATIVAN) 0.5 MG tablet, Take 1 tablet (0.5 mg total) by mouth 2 (two) times daily as needed. for anxiety, Disp: 20 tablet, Rfl: 0 .  SYNTHROID 150 MCG tablet, TAKE 1 TABLET (150 MCG TOTAL) BY MOUTH ONCE DAILY BEFORE BREAKFAST., Disp: 30 tablet, Rfl: 1  EXAM:  Filed Vitals:   08/04/15 1335  BP: 116/78  Pulse: 80  Temp: 97.5 F (36.4 C)    Body mass index is 38.5 kg/(m^2).  GENERAL: vitals reviewed and listed  above, alert, oriented, appears well hydrated and in no acute distress  HEENT: atraumatic, conjunttiva clear, no obvious abnormalities on inspection of external nose and ears  NECK: no obvious masses on inspection  LUNGS: clear to auscultation bilaterally, no wheezes, rales or rhonchi, good air movement  CV: HRRR, no peripheral edema  MS: moves all extremities without noticeable abnormality  PSYCH: pleasant and cooperative, no obvious depression or anxiety  ASSESSMENT AND PLAN:  Discussed the following assessment and plan:  Essential hypertension - Plan: Basic metabolic panel -cont current tx  Other specified hypothyroidism - Plan: TSH -cont scurrent tx  GAD (generalized anxiety disorder) -advised CBT,  discussed risks with benzos, small amount refilled for occ panic attacks  Hyperglycemia/Obesity - Plan: Hemoglobin A1c -recheck today -lifestyle recs  Pure hypercholesterolemia - Plan: Lipid Panel -lifestyle recs  Primary osteoarthritis of both knees - Plan: Ambulatory referral to Orthopedic Surgery -advised plain films and options for tx -she declined eval here and prefers referral  Encounter to establish care -We reviewed the PMH, PSH, FH, SH, Meds and Allergies. -We provided refills for any medications we will prescribe as needed. -We addressed current concerns per orders and patient instructions. -We have asked for records for pertinent exams, studies, vaccines and notes from previous providers. -We have advised patient to follow up per instructions below.   -Patient advised to return or notify a doctor immediately if symptoms worsen or persist or new concerns arise.  Patient Instructions  BEFORE YOU LEAVE: -labs -schedule follow up in 4 months  Please call to set up counseling regarding the anxiety  -We placed a referral for you as discussed per your request to the orthopedic doctor regarding your knee pain. It usually takes about 1-2 weeks to process and schedule this referral. If you have not heard from [place] regarding this appointment in 2 weeks please contact our office.  We recommend the following healthy lifestyle measures: - eat a healthy whole foods diet consisting of regular small meals composed of vegetables, fruits, beans, nuts, seeds, healthy meats such as white chicken and fish and whole grains.  - avoid sweets, white starchy foods, fried foods, fast food, processed foods, sodas, red meet and other fattening foods.  - get a least 150-300 minutes of aerobic exercise per week.   -We have ordered labs or studies at this visit. It can take up to 1-2 weeks for results and processing. We will contact you with instructions IF your results are abnormal. Normal results will  be released to your [facility]. If you have not heard from [place] or can not find your results in [facility] in 2 weeks please contact our office.   -please let us know the name of the gastroenterologist who did your colonosocpy         KIM, Jarrett Soho R.

## 2015-08-04 NOTE — Progress Notes (Signed)
Pre visit review using our clinic review tool, if applicable. No additional management support is needed unless otherwise documented below in the visit note. 

## 2015-08-22 ENCOUNTER — Other Ambulatory Visit: Payer: Self-pay | Admitting: Family

## 2015-08-23 ENCOUNTER — Encounter: Payer: Self-pay | Admitting: Family Medicine

## 2015-10-14 ENCOUNTER — Other Ambulatory Visit: Payer: Self-pay | Admitting: *Deleted

## 2015-10-14 DIAGNOSIS — Z800 Family history of malignant neoplasm of digestive organs: Secondary | ICD-10-CM

## 2015-10-25 ENCOUNTER — Other Ambulatory Visit: Payer: Self-pay | Admitting: Family Medicine

## 2015-11-29 ENCOUNTER — Ambulatory Visit (INDEPENDENT_AMBULATORY_CARE_PROVIDER_SITE_OTHER): Payer: BC Managed Care – PPO | Admitting: Family Medicine

## 2015-11-29 ENCOUNTER — Encounter: Payer: Self-pay | Admitting: Family Medicine

## 2015-11-29 VITALS — BP 122/80 | HR 76 | Temp 97.5°F | Ht 64.0 in | Wt 227.8 lb

## 2015-11-29 DIAGNOSIS — E7800 Pure hypercholesterolemia, unspecified: Secondary | ICD-10-CM

## 2015-11-29 DIAGNOSIS — M170 Bilateral primary osteoarthritis of knee: Secondary | ICD-10-CM

## 2015-11-29 DIAGNOSIS — E038 Other specified hypothyroidism: Secondary | ICD-10-CM | POA: Diagnosis not present

## 2015-11-29 DIAGNOSIS — I10 Essential (primary) hypertension: Secondary | ICD-10-CM | POA: Diagnosis not present

## 2015-11-29 DIAGNOSIS — F411 Generalized anxiety disorder: Secondary | ICD-10-CM | POA: Diagnosis not present

## 2015-11-29 DIAGNOSIS — E669 Obesity, unspecified: Secondary | ICD-10-CM

## 2015-11-29 NOTE — Progress Notes (Signed)
HPI:  HTN: -meds: benicar-hctz -stable  HLD/Obesity/Prediabetes: -has made some changes in lifestyle -walking; trying to eat healthier  Hypothyroidism: -denies hx of RIA or thyroid surgery -meds: synthroid -stable  Bilateral knee pain/OA: -seeing Dr. Layne Benton -doing exercises for PFS and has severe OA but trying to delay surgery, using meloxicam prn -denies: fall, weakness, numbness, malaise, fevers  GAD: -settled in a new home and is doing much better -occ panic attacks in the past - understands risks of benzos and reports uses very infrequently -has seen her pastor about this but has not done formal CBT -denies current depression but has hx of this   ROS: See pertinent positives and negatives per HPI.  Past Medical History  Diagnosis Date  . Arthritis     osteo  . Depression   . FH: migraines   . Thyroid disease   . Hepatitis B     reports dx and tx as a young child, reports no issues since and retested as adult and told did not have hepatitis, she is unsure of type of hep she had  . Essential hypertension 04/27/2014  . GAD (generalized anxiety disorder) 08/04/2015    Past Surgical History  Procedure Laterality Date  . Cholecystectomy    . Tonsillectomy and adenoidectomy    . Appendectomy    . Cataract extraction    . Retinal detachment surgery    . Elbow surgery      shattered elbow  . Endometrial oblation    . Tubal ligation    . Foot surgery      No family history on file.  Social History   Social History  . Marital Status: Married    Spouse Name: N/A  . Number of Children: N/A  . Years of Education: N/A   Social History Main Topics  . Smoking status: Never Smoker   . Smokeless tobacco: None  . Alcohol Use: Yes     Comment: socially  . Drug Use: No  . Sexual Activity: Not Asked   Other Topics Concern  . 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  BENICAR HCT 20-12.5 MG per tablet, TAKE 1 TABLET BY MOUTH ONCE DAILY., Disp: 30 tablet, Rfl: 1 .  LORazepam (ATIVAN) 0.5 MG tablet, Take 1 tablet (0.5 mg total) by mouth 2 (two) times daily as needed. for anxiety, Disp: 20 tablet, Rfl: 0 .  SYNTHROID 150 MCG tablet, TAKE 1 TABLET (150 MCG TOTAL) BY MOUTH ONCE DAILY BEFORE BREAKFAST., Disp: 30 tablet, Rfl: 1  EXAM:  Filed Vitals:   11/29/15 0822  BP: 122/80  Pulse: 76  Temp: 97.5 F (36.4 C)    Body mass index is 39.08 kg/(m^2).  GENERAL: vitals reviewed and listed above, alert, oriented, appears well hydrated and in no acute distress  HEENT: atraumatic, conjunttiva clear, no obvious abnormalities on inspection of external nose and ears  NECK: no obvious masses on inspection  LUNGS: clear to auscultation bilaterally, no wheezes, rales or rhonchi, good air movement  CV: HRRR, no peripheral edema  MS: moves all extremities without noticeable abnormality  PSYCH: pleasant and cooperative, no obvious depression or anxiety  ASSESSMENT AND PLAN:  Discussed the  following assessment and plan:  Essential hypertension  Pure hypercholesterolemia  Other specified hypothyroidism  GAD (generalized anxiety disorder)  Primary osteoarthritis of both knees  Obesity  -lifestyle recs -continue current medications -declines vaccines -Patient advised to return or notify a doctor immediately if symptoms worsen or persist or new concerns arise.  Patient Instructions  BEFORE YOU LEAVE: -schedule Physical Exam in 3-4 months - please come fasting for 8 hours and we will plan to do labs - PLEASE DRINK PLENTY of water   We recommend the following healthy lifestyle measures: - eat a healthy whole foods diet consisting of regular small meals composed of vegetables, fruits, beans, nuts, seeds,  healthy meats such as white chicken and fish and whole grains.  - avoid sweets, white starchy foods, fried foods, fast food, processed foods, sodas, red meet and other fattening foods.  - get a least 150-300 minutes of aerobic exercise per week.       Colin Benton R.

## 2015-11-29 NOTE — Patient Instructions (Signed)
BEFORE YOU LEAVE: -schedule Physical Exam in 3-4 months - please come fasting for 8 hours and we will plan to do labs - PLEASE DRINK PLENTY of water   We recommend the following healthy lifestyle measures: - eat a healthy whole foods diet consisting of regular small meals composed of vegetables, fruits, beans, nuts, seeds, healthy meats such as white chicken and fish and whole grains.  - avoid sweets, white starchy foods, fried foods, fast food, processed foods, sodas, red meet and other fattening foods.  - get a least 150-300 minutes of aerobic exercise per week.

## 2015-11-29 NOTE — Progress Notes (Signed)
Pre visit review using our clinic review tool, if applicable. No additional management support is needed unless otherwise documented below in the visit note. 

## 2015-12-01 ENCOUNTER — Ambulatory Visit: Payer: BC Managed Care – PPO | Admitting: Family Medicine

## 2016-01-09 ENCOUNTER — Ambulatory Visit (INDEPENDENT_AMBULATORY_CARE_PROVIDER_SITE_OTHER): Payer: BC Managed Care – PPO | Admitting: Family Medicine

## 2016-01-09 ENCOUNTER — Encounter: Payer: Self-pay | Admitting: Family Medicine

## 2016-01-09 VITALS — BP 120/72 | HR 77 | Temp 97.7°F | Ht 64.0 in | Wt 224.3 lb

## 2016-01-09 DIAGNOSIS — L989 Disorder of the skin and subcutaneous tissue, unspecified: Secondary | ICD-10-CM | POA: Diagnosis not present

## 2016-01-09 NOTE — Progress Notes (Signed)
Pre visit review using our clinic review tool, if applicable. No additional management support is needed unless otherwise documented below in the visit note. 

## 2016-01-09 NOTE — Patient Instructions (Signed)
Before you leave: -Schedule a follow-up in 1 week for a skin check  Apply clotrimazole, antifungal cream, twice daily. Do not apply any other topical lotions or treatments.  Follow-up sooner if worsening or other concerns.

## 2016-01-09 NOTE — Progress Notes (Addendum)
HPI:  Karina Allen is here for an acute visit for skin rash. [facility] [place] [facility] [place] a small spot itchy skin on the left hand. This has widened some, and has developed some clearing in the center. She admits to some itching and some pain. She has applied topical tea tree oil and hydrocortisone cream. Denies any other skin lesions, fevers, malaise, no trauma or insect bite.  ROS: See pertinent positives and negatives per HPI.  Past Medical History  Diagnosis Date  . Arthritis     osteo  . Depression   . FH: migraines   . Thyroid disease   . Hepatitis B     reports dx and tx as a young child, reports no issues since and retested as adult and told did not have hepatitis, she is unsure of type of hep she had  . Essential hypertension 04/27/2014  . GAD (generalized anxiety disorder) 08/04/2015    Past Surgical History  Procedure Laterality Date  . Cholecystectomy    . Tonsillectomy and adenoidectomy    . Appendectomy    . Cataract extraction    . Retinal detachment surgery    . Elbow surgery      shattered elbow  . Endometrial oblation    . Tubal ligation    . Foot surgery      No family history on file.  Social History   Social History  . Marital Status: Married    Spouse Name: N/A  . Number of Children: N/A  . Years of Education: N/A   Social History Main Topics  . Smoking status: Never Smoker   . Smokeless tobacco: None  . Alcohol Use: Yes     Comment: socially  . Drug Use: No  . Sexual Activity: Not Asked   Other Topics Concern  . 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  BENICAR HCT 20-12.5 MG per tablet, TAKE 1 TABLET BY MOUTH ONCE DAILY., Disp: 30 tablet, Rfl: 1 .  LORazepam (ATIVAN) 0.5 MG  tablet, Take 1 tablet (0.5 mg total) by mouth 2 (two) times daily as needed. for anxiety, Disp: 20 tablet, Rfl: 0 .  SYNTHROID 150 MCG tablet, TAKE 1 TABLET (150 MCG TOTAL) BY MOUTH ONCE DAILY BEFORE BREAKFAST., Disp: 30 tablet, Rfl: 1  EXAM:  Filed Vitals:   01/09/16 1017  BP: 120/72  Pulse: 77  Temp: 97.7 F (36.5 C)    Body mass index is 38.48 kg/(m^2).  GENERAL: vitals reviewed and listed above, alert, oriented, appears well hydrated and in no acute distress  HEENT: atraumatic, conjunttiva clear, no obvious abnormalities on inspection of external nose and ears  NECK: no obvious masses on inspection  SKIN: There is a very superficial anunular erythematous plaque, approximately 2 cm in diameter, on the right dorsal hand. The border is slightly serpiginous in character, but is barely raised, and has a slight scale, the center is clearing. There is a possible second much smaller, less than 5 mm patch over the second knuckle. There is no induration, crusting or pus.  MS: moves all extremities without noticeable abnormality  PSYCH: pleasant and cooperative, no obvious depression or anxiety  ASSESSMENT AND PLAN:  Discussed the following assessment and plan:  Skin lesion  -Suspect fungal etiology most likely, slightly atypical appearance, and this may be due to the use of topical tea tree oil? Also query Porokeratosis given appearance vs other. -Treat with topical antifungal and have her follow up in 1 week to recheck. -Patient advised to return or notify a doctor immediately if symptoms worsen or persist or new concerns arise.  Patient Instructions  Before you leave: -Schedule a follow-up in 1 week for a skin check  Apply clotrimazole, antifungal cream, twice daily. Do not apply any other topical lotions or treatments.  Follow-up sooner if worsening or other concerns.     Colin Benton R.

## 2016-01-16 ENCOUNTER — Ambulatory Visit (INDEPENDENT_AMBULATORY_CARE_PROVIDER_SITE_OTHER): Payer: BC Managed Care – PPO | Admitting: Family Medicine

## 2016-01-16 ENCOUNTER — Encounter: Payer: Self-pay | Admitting: Family Medicine

## 2016-01-16 VITALS — BP 118/74 | HR 75 | Temp 97.6°F | Ht 64.0 in

## 2016-01-16 DIAGNOSIS — B359 Dermatophytosis, unspecified: Secondary | ICD-10-CM

## 2016-01-16 NOTE — Progress Notes (Signed)
Pre visit review using our clinic review tool, if applicable. No additional management support is needed unless otherwise documented below in the visit note. Patient declines weight measurement today. 

## 2016-01-16 NOTE — Progress Notes (Signed)
  HPI:  Follow up:  Skin rash -almost entirely resolved -no pain or pruritis, still applying topical azole  ROS: See pertinent positives and negatives per HPI.  Past Medical History  Diagnosis Date  . Arthritis     osteo  . Depression   . FH: migraines   . Thyroid disease   . Hepatitis B     reports dx and tx as a young child, reports no issues since and retested as adult and told did not have hepatitis, she is unsure of type of hep she had  . Essential hypertension 04/27/2014  . GAD (generalized anxiety disorder) 08/04/2015    Past Surgical History  Procedure Laterality Date  . Cholecystectomy    . Tonsillectomy and adenoidectomy    . Appendectomy    . Cataract extraction    . Retinal detachment surgery    . Elbow surgery      shattered elbow  . Endometrial oblation    . Tubal ligation    . Foot surgery      No family history on file.  Social History   Social History  . Marital Status: Married    Spouse Name: N/A  . Number of Children: N/A  . Years of Education: N/A   Social History Main Topics  . Smoking status: Never Smoker   . Smokeless tobacco: None  . Alcohol Use: Yes     Comment: socially  . Drug Use: No  . Sexual Activity: Not Asked   Other Topics Concern  . 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  BENICAR HCT 20-12.5 MG per tablet, TAKE 1 TABLET BY MOUTH ONCE DAILY., Disp: 30 tablet, Rfl: 1 .  LORazepam (ATIVAN) 0.5 MG tablet, Take 1 tablet (0.5 mg total) by mouth 2 (two) times daily as needed. for anxiety, Disp: 20 tablet, Rfl: 0 .  SYNTHROID 150 MCG tablet, TAKE 1 TABLET (150 MCG TOTAL) BY MOUTH ONCE DAILY BEFORE BREAKFAST., Disp: 30 tablet, Rfl: 1  EXAM:  Filed Vitals:   01/16/16 0808  BP: 118/74   Pulse: 75  Temp: 97.6 F (36.4 C)    There is no weight on file to calculate BMI.  GENERAL: vitals reviewed and listed above, alert, oriented, appears well hydrated and in no acute distress  HEENT: atraumatic, conjunttiva clear, no obvious abnormalities on inspection of external nose and ears  NECK: no obvious masses on inspection  SKIN: remarkable resolution rash on hand, minimal scaling at site of larger patch, smaller patch completely resolved  MS: moves all extremities without noticeable abnormality  PSYCH: pleasant and cooperative, no obvious depression or anxiety  ASSESSMENT AND PLAN:  Discussed the following assessment and plan:  Tinea  -continue antifungal for 5 day after complete resolution of larger patch -follow up if any persistence or recurrence -CPE with labs as scheduled  -Patient advised to return or notify a doctor immediately if symptoms worsen or persist or new concerns arise.  There are no Patient Instructions on file for this visit.   Colin Benton R.

## 2016-01-25 ENCOUNTER — Other Ambulatory Visit: Payer: Self-pay | Admitting: Family Medicine

## 2016-03-28 ENCOUNTER — Encounter: Payer: BC Managed Care – PPO | Admitting: Family Medicine

## 2016-04-19 ENCOUNTER — Encounter: Payer: Self-pay | Admitting: Family Medicine

## 2016-04-19 ENCOUNTER — Ambulatory Visit (INDEPENDENT_AMBULATORY_CARE_PROVIDER_SITE_OTHER): Payer: BC Managed Care – PPO | Admitting: Family Medicine

## 2016-04-19 VITALS — BP 124/80 | HR 77 | Temp 97.8°F | Ht 64.0 in | Wt 220.4 lb

## 2016-04-19 DIAGNOSIS — E038 Other specified hypothyroidism: Secondary | ICD-10-CM | POA: Diagnosis not present

## 2016-04-19 DIAGNOSIS — M170 Bilateral primary osteoarthritis of knee: Secondary | ICD-10-CM

## 2016-04-19 DIAGNOSIS — I10 Essential (primary) hypertension: Secondary | ICD-10-CM

## 2016-04-19 DIAGNOSIS — F411 Generalized anxiety disorder: Secondary | ICD-10-CM

## 2016-04-19 DIAGNOSIS — E7800 Pure hypercholesterolemia, unspecified: Secondary | ICD-10-CM | POA: Diagnosis not present

## 2016-04-19 DIAGNOSIS — R739 Hyperglycemia, unspecified: Secondary | ICD-10-CM | POA: Diagnosis not present

## 2016-04-19 DIAGNOSIS — Z0000 Encounter for general adult medical examination without abnormal findings: Secondary | ICD-10-CM | POA: Diagnosis not present

## 2016-04-19 LAB — HEMOGLOBIN A1C: HEMOGLOBIN A1C: 5.8 % (ref 4.6–6.5)

## 2016-04-19 LAB — BASIC METABOLIC PANEL
BUN: 16 mg/dL (ref 6–23)
CALCIUM: 9 mg/dL (ref 8.4–10.5)
CO2: 26 meq/L (ref 19–32)
CREATININE: 0.85 mg/dL (ref 0.40–1.20)
Chloride: 105 mEq/L (ref 96–112)
GFR: 71.56 mL/min (ref >60.00)
Glucose, Bld: 104 mg/dL — ABNORMAL HIGH (ref 70–99)
Potassium: 3.8 mEq/L (ref 3.5–5.1)
SODIUM: 138 meq/L (ref 135–145)

## 2016-04-19 LAB — CBC WITH DIFFERENTIAL/PLATELET
BASOS ABS: 0.1 10*3/uL (ref 0.0–0.1)
Basophils Relative: 0.8 % (ref 0.0–3.0)
EOS ABS: 0.2 10*3/uL (ref 0.0–0.7)
Eosinophils Relative: 2.6 % (ref 0.0–5.0)
HEMATOCRIT: 36.7 % (ref 36.0–46.0)
Hemoglobin: 12.6 g/dL (ref 12.0–15.0)
LYMPHS PCT: 29.7 % (ref 12.0–46.0)
Lymphs Abs: 2.2 10*3/uL (ref 0.7–4.0)
MCHC: 34.2 g/dL (ref 30.0–36.0)
MCV: 84 fl (ref 78.0–100.0)
MONOS PCT: 5.1 % (ref 3.0–12.0)
Monocytes Absolute: 0.4 10*3/uL (ref 0.1–1.0)
NEUTROS ABS: 4.5 10*3/uL (ref 1.4–7.7)
NEUTROS PCT: 61.8 % (ref 43.0–77.0)
PLATELETS: 251 10*3/uL (ref 150.0–400.0)
RBC: 4.37 Mil/uL (ref 3.87–5.11)
RDW: 13 % (ref 11.5–15.5)
WBC: 7.3 10*3/uL (ref 4.0–10.5)

## 2016-04-19 LAB — LIPID PANEL
CHOLESTEROL: 187 mg/dL (ref 0–200)
HDL: 36.9 mg/dL — ABNORMAL LOW (ref >39.00)
LDL Cholesterol: 114 mg/dL — ABNORMAL HIGH (ref 0–99)
NonHDL: 150.39
TRIGLYCERIDES: 184 mg/dL — AB (ref 0.0–149.0)
Total CHOL/HDL Ratio: 5
VLDL: 36.8 mg/dL (ref 0.0–40.0)

## 2016-04-19 LAB — TSH: TSH: 0.8 u[IU]/mL (ref 0.35–4.50)

## 2016-04-19 NOTE — Progress Notes (Signed)
Pre visit review using our clinic review tool, if applicable. No additional management support is needed unless otherwise documented below in the visit note. 

## 2016-04-19 NOTE — Patient Instructions (Signed)
Follow up in 4-6 months  Vit D3 [phone] IU daily  We recommend the following healthy lifestyle measures: - eat a healthy whole foods diet consisting of regular small meals composed of vegetables, fruits, beans, nuts, seeds, healthy meats such as white chicken and fish and whole grains.  - avoid sweets, white starchy foods, fried foods, fast food, processed foods, sodas, red meet and other fattening foods.  - get a least 150-300 minutes of aerobic exercise per week.    We have ordered labs or studies at this visit. It can take up to 1-2 weeks for results and processing. IF results require follow up or explanation, we will call you with instructions. Clinically stable results will be released to your [facility]. If you have not heard from [place] or cannot find your results in [facility] in 2 weeks please contact our office at [phone].  If you are not yet signed up for [facility], please consider signing up.

## 2016-04-19 NOTE — Progress Notes (Addendum)
HPI:  Here for CPE. Last CPE done with prior PCP.  -Concerns and/or follow up today:   HTN: -meds: benicar-hctz -stable  HLD/Obesity/Prediabetes: -has made some changes in lifestyle - quit eating red meat, aerobics in the pool and walking, using doterra essential oils and tumeric supplement -walking; trying to eat healthier  Hypothyroidism: -denies hx of RIA or thyroid surgery -meds: synthroid -stable  Bilateral knee pain/OA: -seeing Dr. Layne Benton when needed -doing exercises for PFS and has severe OA but trying to delay surgery, using meloxicam prn -denies: fall, weakness, numbness, malaise, fevers  GAD: -doing well -occ panic attacks in the past - understands risks of benzos and reports uses very infrequently -has seen her pastor about this but has not done formal CBT -denies current depression but has hx of this  -Taking folic acid, vitamin D or calcium: no  -Diabetes and Dyslipidemia Screening: FASTING  -Hx of HTN: no  -Vaccines:she refuses vaccines  -pap history: normal 07/17/13  -FDLMP: n/a  -sexual activity: yes, female partner, no new partners  -wants STI testing (Hep C if born [birth year]): no  -FH breast, colon or ovarian ca: see FH Last mammogram: 12/01/14 Last colon cancer screening: 08/2007; repeat in 5 years advised due to [place]; she reports she does not have any family hx of colon cancer. She does not want to do colon cancer screening now.  Breast Ca Risk Assessment: -no family or personal hx, she did the online risks assessment and it was very low, she is frustrated that the breast center di dnot have her old records and made her come back after last mammo.  -Alcohol, Tobacco, drug use: see social history  Review of Systems - no fevers, unintentional weight loss, vision loss, hearing loss, chest pain, sob, hemoptysis, melena, hematochezia, hematuria, genital discharge, changing or concerning skin lesions, bleeding, bruising, loc, thoughts of self harm or  SI  Past Medical History  Diagnosis Date  . Arthritis     osteo  . Depression   . FH: migraines   . Thyroid disease   . Hepatitis B     reports dx and tx as a young child, reports no issues since and retested as adult and told did not have hepatitis, she is unsure of type of hep she had  . Essential hypertension 04/27/2014  . GAD (generalized anxiety disorder) 08/04/2015    Past Surgical History  Procedure Laterality Date  . Cholecystectomy    . Tonsillectomy and adenoidectomy    . Appendectomy    . Cataract extraction    . Retinal detachment surgery    . Elbow surgery      shattered elbow  . Endometrial oblation    . Tubal ligation    . Foot surgery      Family History  Problem Relation Age of Onset  . Prostate cancer Brother     88    Social History   Social History  . Marital Status: Married    Spouse Name: N/A  . Number of Children: N/A  . Years of Education: N/A   Social History Main Topics  . Smoking status: Never Smoker   . Smokeless tobacco: None  . Alcohol Use: Yes     Comment: socially  . Drug Use: No  . Sexual Activity: Not Asked   Other Topics Concern  . 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  BENICAR HCT 20-12.5 MG tablet, TAKE 1 TABLET BY MOUTH ONCE DAILY., Disp: 90 tablet, Rfl: 1 .  LORazepam (ATIVAN) 0.5 MG tablet, Take 1 tablet (0.5 mg total) by mouth 2 (two) times daily as needed. for anxiety, Disp: 20 tablet, Rfl: 0 .  SYNTHROID 150 MCG tablet, TAKE 1 TABLET (150 MCG TOTAL) BY MOUTH ONCE DAILY BEFORE BREAKFAST., Disp: 90 tablet, Rfl: 1  EXAM:  Filed Vitals:   04/19/16 0701  BP: 124/80  Pulse: 77  Temp: 97.8 F (36.6 C)    GENERAL: vitals reviewed and listed below, alert, oriented, appears  well hydrated and in no acute distress  HEENT: head atraumatic, PERRLA, normal appearance of eyes, ears, nose and mouth. moist mucus membranes.  NECK: supple, no masses or lymphadenopathy  LUNGS: clear to auscultation bilaterally, no rales, rhonchi or wheeze  CV: HRRR, no peripheral edema or cyanosis, normal pedal pulses  BREAST: normal appearance - no lesions or discharge, on palpation normal breast tissue without any suspicious masses  ABDOMEN: bowel sounds normal, soft, non tender to palpation, no masses, no rebound or guarding  GU: she refused this exam  SKIN: no rash or abnormal lesions; declined full skin exam  MS: normal gait, moves all extremities normally  NEURO: CN II-XII grossly intact, normal muscle strength and sensation to light touch on extremities  PSYCH: normal affect, pleasant and cooperative  ASSESSMENT AND PLAN:  Discussed the following assessment and plan:  Visit for preventive health examination  Pure hypercholesterolemia - Plan: Lipid Panel  Hyperglycemia - Plan: Hemoglobin A1c  Essential hypertension - Plan: Basic metabolic panel, CBC with Differential/Platelets  Other specified hypothyroidism - Plan: TSH  GAD (generalized anxiety disorder)  Primary osteoarthritis of both knees  -advised to schedule mammogram and discussed risks and benefits  -advised to call if she changes her mind on colon cancer screening - she report she has no FH. She may consider cologuard, refused o/w.  -Discussed and advised all [place] preventive services health task force level A and B recommendations for age, sex and risks.  -Advised at least 150 minutes of exercise per week and a healthy diet low in saturated fats and sweets and consisting of fresh fruits and vegetables, lean meats such as fish and white chicken and whole grains.  -FASTING labs, studies and vaccines per orders this encounter  Orders Placed This Encounter  Procedures  . Lipid Panel  . Hemoglobin A1c   . TSH  . Basic metabolic panel  . CBC with Differential/Platelets    Patient advised to return to clinic immediately if symptoms worsen or persist or new concerns.  Patient Instructions  Follow up in 4-6 months  Vit D3 [id number] IU daily  We recommend the following healthy lifestyle measures: - eat a healthy whole foods diet consisting of regular small meals composed of vegetables, fruits, beans, nuts, seeds, healthy meats such as white chicken and fish and whole grains.  - avoid sweets, white starchy foods, fried foods, fast food, processed foods, sodas, red meet and other fattening foods.  - get a least 150-300 minutes of aerobic exercise per week.    We have ordered labs or studies at this visit. It can take up to 1-2 weeks for results and processing. IF results require follow up or explanation, we will call you with instructions. Clinically stable  results will be released to your [facility]. If you have not heard from [place] or cannot find your results in [facility] in 2 weeks please contact our office at [phone].  If you are not yet signed up for [facility], please consider signing up.           No Follow-up on file.  Colin Benton R.

## 2016-06-30 ENCOUNTER — Other Ambulatory Visit: Payer: Self-pay | Admitting: Family Medicine

## 2016-07-24 ENCOUNTER — Other Ambulatory Visit: Payer: Self-pay | Admitting: Family Medicine

## 2016-08-23 ENCOUNTER — Ambulatory Visit: Payer: BC Managed Care – PPO | Admitting: Family Medicine

## 2016-09-10 ENCOUNTER — Encounter: Payer: Self-pay | Admitting: Family Medicine

## 2016-09-10 ENCOUNTER — Ambulatory Visit (INDEPENDENT_AMBULATORY_CARE_PROVIDER_SITE_OTHER): Payer: BC Managed Care – PPO | Admitting: Family Medicine

## 2016-09-10 VITALS — BP 118/80 | HR 72 | Temp 97.6°F | Ht 64.0 in | Wt 220.7 lb

## 2016-09-10 DIAGNOSIS — E7800 Pure hypercholesterolemia, unspecified: Secondary | ICD-10-CM

## 2016-09-10 DIAGNOSIS — E038 Other specified hypothyroidism: Secondary | ICD-10-CM

## 2016-09-10 DIAGNOSIS — R739 Hyperglycemia, unspecified: Secondary | ICD-10-CM | POA: Diagnosis not present

## 2016-09-10 DIAGNOSIS — I10 Essential (primary) hypertension: Secondary | ICD-10-CM

## 2016-09-10 LAB — CBC
HEMATOCRIT: 38.8 % (ref 36.0–46.0)
Hemoglobin: 13.2 g/dL (ref 12.0–15.0)
MCHC: 34 g/dL (ref 30.0–36.0)
MCV: 85.1 fl (ref 78.0–100.0)
Platelets: 254 10*3/uL (ref 150.0–400.0)
RBC: 4.57 Mil/uL (ref 3.87–5.11)
RDW: 13.3 % (ref 11.5–15.5)
WBC: 7.6 10*3/uL (ref 4.0–10.5)

## 2016-09-10 LAB — BASIC METABOLIC PANEL
BUN: 15 mg/dL (ref 6–23)
CHLORIDE: 105 meq/L (ref 96–112)
CO2: 26 meq/L (ref 19–32)
CREATININE: 0.86 mg/dL (ref 0.40–1.20)
Calcium: 9.5 mg/dL (ref 8.4–10.5)
GFR: 70.51 mL/min (ref >60.00)
Glucose, Bld: 104 mg/dL — ABNORMAL HIGH (ref 70–99)
Potassium: 4.3 mEq/L (ref 3.5–5.1)
Sodium: 140 mEq/L (ref 135–145)

## 2016-09-10 LAB — TSH: TSH: 1.52 u[IU]/mL (ref 0.35–4.50)

## 2016-09-10 NOTE — Patient Instructions (Addendum)
BEFORE YOU LEAVE: -follow up: 4-6 months, come fasting and will do labs then -labs  We have ordered labs or studies at this visit. It can take up to 1-2 weeks for results and processing. IF results require follow up or explanation, we will call you with instructions. Clinically stable results will be released to your [facility]. If you have not heard from [place] or cannot find your results in [facility] in 2 weeks please contact our office at [phone].  If you are not yet signed up for [facility], please consider signing up.   We recommend the following healthy lifestyle for LIFE: 1) Small portions.   Tip: eat off of a salad plate instead of a dinner plate.  Tip: It is ok to feel hungry after a meal if you had proper portion sizes  Tip: if you need more or a snack choose fruits, veggies and/or a handful of nuts or seeds.  2) Eat a healthy clean diet.  * Tip: Avoid (less then 1 serving per week): processed foods, sweets, sweetened drinks, white starches (rice, flour, bread, potatoes, pasta, etc), red meat, fast foods, butter  *Tip: CHOOSE instead   * 5-9 servings per day of fresh or frozen fruits and vegetables (but not corn, potatoes, bananas, canned or dried fruit)   *nuts and seeds, beans   *olives and olive oil   *small portions of lean meats such as fish and white chicken    *small portions of whole grains  3)Get at least 150 minutes of sweaty aerobic exercise per week.  4)Reduce stress - consider counseling, meditation and relaxation to balance other aspects of your life.

## 2016-09-10 NOTE — Progress Notes (Signed)
HPI:  Karina Allen is a pleasant 65 yo with PMH summarized below. She is semi-compliant with lifestyle changes for obesity, prediabetes, HTN, HLD. Reports trying to walk on a regular basis, but diet not great. Caring for 4 yo mother. Last wt was 220 04/2016 and today wt is 220lbs. She continues her antihypertensives and her levothyroxine. Mood has been ok, despite stress of being a caregiver. Denies CP, SOB, DOE, depression.   HTN: -meds: benicar-hctz -stable  HLD/Obesity/Prediabetes: -working on lifestyle, does not like to take medications  Hypothyroidism: -denies hx of RIA or thyroid surgery -meds: synthroid -stable  Bilateral knee pain/OA: -seeing Dr. Layne Benton when needed -doing exercises for PFS and has severe OA but trying to delay surgery, using meloxicam prn  GAD: -doing well - reports improving -occ panic attacks in the past - understands risks of benzos and reports uses very infrequently -has seen her pastor about this but has not done formal CBT  ROS: See pertinent positives and negatives per HPI.  Past Medical History:  Diagnosis Date  . Arthritis    osteo  . Depression   . Essential hypertension 04/27/2014  . FH: migraines   . GAD (generalized anxiety disorder) 08/04/2015  . Hepatitis B    reports dx and tx as a young child, reports no issues since and retested as adult and told did not have hepatitis, she is unsure of type of hep she had  . Thyroid disease     Past Surgical History:  Procedure Laterality Date  . APPENDECTOMY    . CATARACT EXTRACTION    . CHOLECYSTECTOMY    . ELBOW SURGERY     shattered elbow  . endometrial oblation    . FOOT SURGERY    . RETINAL DETACHMENT SURGERY    . TONSILLECTOMY AND ADENOIDECTOMY    . TUBAL LIGATION      Family History  Problem Relation Age of Onset  . Prostate cancer Brother     65    Social History   Social History  . Marital status: Married    Spouse name: N/A  . Number of children: N/A  . Years  of education: N/A   Social History Main Topics  . Smoking status: Never Smoker  . Smokeless tobacco: None  . Alcohol use Yes     Comment: socially  . Drug use: No  . Sexual activity: Not Asked   Other Topics Concern  . 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  LORazepam (ATIVAN) 0.5 MG tablet, Take 1 tablet (0.5 mg total) by mouth 2 (two) times daily as needed. for anxiety, Disp: 20 tablet, Rfl: 0 .  olmesartan-hydrochlorothiazide (BENICAR HCT) 20-12.5 MG tablet, TAKE 1 TABLET BY MOUTH ONCE DAILY., Disp: 30 tablet, Rfl: 5 .  SYNTHROID 150 MCG tablet, TAKE 1 TABLET (150 MCG TOTAL) BY MOUTH ONCE DAILY BEFORE BREAKFAST., Disp: 90 tablet, Rfl: 1  EXAM:  Vitals:   09/10/16 0856  BP: 118/80  Pulse: 72  Temp: 97.6 F (36.4 C)    Body mass index is 37.88 kg/m.  GENERAL: vitals reviewed and listed above, alert, oriented, appears well hydrated and in no acute distress  HEENT: atraumatic, conjunttiva  clear, no obvious abnormalities on inspection of external nose and ears  NECK: no obvious masses on inspection  LUNGS: clear to auscultation bilaterally, no wheezes, rales or rhonchi, good air movement  CV: HRRR, no peripheral edema  MS: moves all extremities without noticeable abnormality  PSYCH: pleasant and cooperative, no obvious depression or anxiety  ASSESSMENT AND PLAN:  Discussed the following assessment and plan:  Essential hypertension - Plan: Basic metabolic panel, CBC (no diff)  Other specified hypothyroidism - Plan: TSH  Hyperglycemia  Pure hypercholesterolemia  -labs -lifestyle recs - summarized in pt instructions -last pap with hpv neg 07/2013 - advised CPE in 3 months with pap; she reports she does not want to do any  further paps - reports all were normal and no new partners and last PCP told her no further paps needed -declined all vaccines " I don't do vaccines" -declined hep c screening -Patient advised to return or notify a doctor immediately if symptoms worsen or persist or new concerns arise.  Patient Instructions  BEFORE YOU LEAVE: -follow up: 4-6 months, come fasting and will do labs then -labs  We have ordered labs or studies at this visit. It can take up to 1-2 weeks for results and processing. IF results require follow up or explanation, we will call you with instructions. Clinically stable results will be released to your [facility]. If you have not heard from [place] or cannot find your results in [facility] in 2 weeks please contact our office at [phone].  If you are not yet signed up for [facility], please consider signing up.   We recommend the following healthy lifestyle for LIFE: 1) Small portions.   Tip: eat off of a salad plate instead of a dinner plate.  Tip: It is ok to feel hungry after a meal if you had proper portion sizes  Tip: if you need more or a snack choose fruits, veggies and/or a handful of nuts or seeds.  2) Eat a healthy clean diet.  * Tip: Avoid (less then 1 serving per week): processed foods, sweets, sweetened drinks, white starches (rice, flour, bread, potatoes, pasta, etc), red meat, fast foods, butter  *Tip: CHOOSE instead   * 5-9 servings per day of fresh or frozen fruits and vegetables (but not corn, potatoes, bananas, canned or dried fruit)   *nuts and seeds, beans   *olives and olive oil   *small portions of lean meats such as fish and white chicken    *small portions of whole grains  3)Get at least 150 minutes of sweaty aerobic exercise per week.  4)Reduce stress - consider counseling, meditation and relaxation to balance other aspects of your life.            Colin Benton R., DO

## 2016-09-10 NOTE — Progress Notes (Signed)
Pre visit review using our clinic review tool, if applicable. No additional management support is needed unless otherwise documented below in the visit note. 

## 2017-01-17 ENCOUNTER — Telehealth: Payer: Self-pay | Admitting: Family Medicine

## 2017-01-17 MED ORDER — LEVOTHYROXINE SODIUM 150 MCG PO TABS: ORAL_TABLET | ORAL | 1 refills | Status: DC

## 2017-01-17 NOTE — Telephone Encounter (Signed)
Rx done. 

## 2017-01-17 NOTE — Telephone Encounter (Signed)
Pt need new Rx for synthroid   Pharm:  [facility] at [place]  Has a appt 02/05/17

## 2017-02-04 NOTE — Progress Notes (Signed)
HPI:  Karina Allen is a pleasant 66 y.o. here for follow up. Chronic medical problems summarized below were reviewed for changes and stability and were updated as needed below. These issues and their treatment remain stable for the most part. Doing well for the most part. Reports has Retinal surgery this week for epiretinal membrane with Dr. Iona Hansen - primary optho is Dr. Valetta Close. No regular exercise. Diet not great. However, has cut down on some obligations and will be trying to work on a healthier lifestyle. Denies CP, SOB, DOE, treatment intolerance or new symptoms. Due for labs, mammo. Declined pap, flu vaccines hep c screenig Due for CPE in June.  HTN: -meds: benicar-hctz -stable  HLD/Obesity/Prediabetes: -working on lifestyle, does not like to take medications  Hypothyroidism: -denies hx of RIA or thyroid surgery -meds: synthroid -stable  Bilateral knee pain/OA: -seeing Dr. Layne Benton when needed -doing exercises for PFS and has severe OA but trying to delay surgery, using meloxicam prn  GAD: -doing well - reports improving -occ panic attacks in the past - understands risks of benzos and reports uses very infrequently -has seen her pastor about this but has not done formal CBT  ROS: See pertinent positives and negatives per HPI.   ROS: See pertinent positives and negatives per HPI.  Past Medical History:  Diagnosis Date  . Arthritis    osteo  . Depression   . Epiretinal membrane 02/05/2017   Sees Dr. Valetta Close and Dr. Iona Hansen  . Essential hypertension 04/27/2014  . FH: migraines   . GAD (generalized anxiety disorder) 08/04/2015  . Hepatitis B    reports dx and tx as a young child, reports no issues since and retested as adult and told did not have hepatitis, she is unsure of type of hep she had  . Thyroid disease     Past Surgical History:  Procedure Laterality Date  . APPENDECTOMY    . CATARACT EXTRACTION    . CHOLECYSTECTOMY    . ELBOW SURGERY     shattered elbow   . endometrial oblation    . FOOT SURGERY    . RETINAL DETACHMENT SURGERY    . TONSILLECTOMY AND ADENOIDECTOMY    . TUBAL LIGATION      Family History  Problem Relation Age of Onset  . Prostate cancer Brother     48    Social History   Social History  . Marital status: Married    Spouse name: N/A  . Number of children: N/A  . Years of education: N/A   Social History Main Topics  . Smoking status: Never Smoker  . Smokeless tobacco: Never Used  . Alcohol use Yes     Comment: socially  . Drug use: No  . Sexual activity: Not Asked   Other Topics Concern  . 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  levothyroxine (SYNTHROID) 150 MCG tablet, TAKE 1 TABLET (150 MCG TOTAL) [place]., Disp: 90 tablet, Rfl: 1 .  LORazepam (ATIVAN) 0.5 MG tablet, Take 1 tablet (0.5 mg total) by mouth 2 (two) times daily as needed. for anxiety, Disp: 20 tablet, Rfl: 0 .  olmesartan-hydrochlorothiazide (BENICAR HCT) 20-12.5  MG tablet, TAKE 1 TABLET BY MOUTH ONCE DAILY., Disp: 30 tablet, Rfl: 5  EXAM:  Vitals:   02/05/17 1030  BP: 110/76  Pulse: 72  Temp: 97.5 F (36.4 C)    Body mass index is 38.35 kg/m.  GENERAL: vitals reviewed and listed above, alert, oriented, appears well hydrated and in no acute distress  HEENT: atraumatic, conjunttiva clear, no obvious abnormalities on inspection of external nose and ears  NECK: no obvious masses on inspection  LUNGS: clear to auscultation bilaterally, no wheezes, rales or rhonchi, good air movement  CV: HRRR, no peripheral edema  MS: moves all extremities without noticeable abnormality  PSYCH: pleasant and cooperative, no obvious depression or anxiety  ASSESSMENT AND  PLAN:  Discussed the following assessment and plan:  Essential hypertension - Plan: Basic metabolic panel, CBC  Other specified hypothyroidism - Plan: TSH  Hyperglycemia  Pure hypercholesterolemia - Plan: Lipid panel  BMI 38.0-38.9,adult  Epiretinal membrane, unspecified laterality  Class 2 obesity due to excess calories with serious comorbidity and body mass index (BMI) of 38.0 to 38.9 in adult  -lifestyle recs -labs today -advised to schedule mammogram -hope her eye surgery goes well -CPE this summer -Patient advised to return or notify a doctor immediately if symptoms worsen or persist or new concerns arise.  Patient Instructions  BEFORE YOU LEAVE: -follow up: schedule annual exam in July or August -labs  Call today to schedule your mammogram.  We have ordered labs or studies at this visit. It can take up to 1-2 weeks for results and processing. IF results require follow up or explanation, we will call you with instructions. Clinically stable results will be released to your [facility]. If you have not heard from [place] or cannot find your results in [facility] in 2 weeks please contact our office at [phone].  If you are not yet signed up for [facility], please consider signing up.    We recommend the following healthy lifestyle for LIFE: 1) Small portions.   Tip: eat off of a salad plate instead of a dinner plate.  Tip: It is ok to feel hungry after a meal of proper portion sizes.  Tip: if you need more or a snack choose fruits, veggies and/or a handful of nuts or seeds.  2) Eat a healthy clean diet.  * Tip: Avoid (less then 1 serving per week): processed foods, sweets, sweetened drinks, white starches (rice, flour, bread, potatoes, pasta, etc), red meat, fast foods, butter  *Tip: CHOOSE instead   * 5-9 servings per day of fresh or frozen fruits and vegetables (but not corn, potatoes, bananas, canned or dried fruit)   *nuts and seeds, beans   *olives and olive  oil   *small portions of lean meats such as fish and white chicken    *small portions of whole grains  3)Get at least 150 minutes of sweaty aerobic exercise per week.  4)Reduce stress - consider counseling, meditation and relaxation to balance other aspects of your life.         Colin Benton R., DO

## 2017-02-05 ENCOUNTER — Encounter: Payer: Self-pay | Admitting: Family Medicine

## 2017-02-05 ENCOUNTER — Ambulatory Visit (INDEPENDENT_AMBULATORY_CARE_PROVIDER_SITE_OTHER): Payer: BC Managed Care – PPO | Admitting: Family Medicine

## 2017-02-05 VITALS — BP 110/76 | HR 72 | Temp 97.5°F | Ht 64.0 in | Wt 223.4 lb

## 2017-02-05 DIAGNOSIS — R739 Hyperglycemia, unspecified: Secondary | ICD-10-CM | POA: Diagnosis not present

## 2017-02-05 DIAGNOSIS — Z6838 Body mass index (BMI) 38.0-38.9, adult: Secondary | ICD-10-CM

## 2017-02-05 DIAGNOSIS — E038 Other specified hypothyroidism: Secondary | ICD-10-CM

## 2017-02-05 DIAGNOSIS — H35379 Puckering of macula, unspecified eye: Secondary | ICD-10-CM | POA: Diagnosis not present

## 2017-02-05 DIAGNOSIS — E7800 Pure hypercholesterolemia, unspecified: Secondary | ICD-10-CM

## 2017-02-05 DIAGNOSIS — IMO0001 Reserved for inherently not codable concepts without codable children: Secondary | ICD-10-CM

## 2017-02-05 DIAGNOSIS — E6609 Other obesity due to excess calories: Secondary | ICD-10-CM

## 2017-02-05 DIAGNOSIS — I10 Essential (primary) hypertension: Secondary | ICD-10-CM

## 2017-02-05 HISTORY — DX: Puckering of macula, unspecified eye: H35.379

## 2017-02-05 LAB — LIPID PANEL
CHOL/HDL RATIO: 5
Cholesterol: 203 mg/dL — ABNORMAL HIGH (ref 0–200)
HDL: 42.7 mg/dL (ref >39.00)
LDL CALC: 131 mg/dL — AB (ref 0–99)
NonHDL: 159.83
Triglycerides: 144 mg/dL (ref 0.0–149.0)
VLDL: 28.8 mg/dL (ref 0.0–40.0)

## 2017-02-05 LAB — CBC
HEMATOCRIT: 38 % (ref 36.0–46.0)
HEMOGLOBIN: 13.2 g/dL (ref 12.0–15.0)
MCHC: 34.7 g/dL (ref 30.0–36.0)
MCV: 84.9 fl (ref 78.0–100.0)
PLATELETS: 267 10*3/uL (ref 150.0–400.0)
RBC: 4.47 Mil/uL (ref 3.87–5.11)
RDW: 13.2 % (ref 11.5–15.5)
WBC: 7.7 10*3/uL (ref 4.0–10.5)

## 2017-02-05 LAB — BASIC METABOLIC PANEL
BUN: 16 mg/dL (ref 6–23)
CALCIUM: 9.4 mg/dL (ref 8.4–10.5)
CHLORIDE: 105 meq/L (ref 96–112)
CO2: 25 mEq/L (ref 19–32)
CREATININE: 0.83 mg/dL (ref 0.40–1.20)
GFR: 73.36 mL/min (ref >60.00)
Glucose, Bld: 114 mg/dL — ABNORMAL HIGH (ref 70–99)
Potassium: 4 mEq/L (ref 3.5–5.1)
Sodium: 138 mEq/L (ref 135–145)

## 2017-02-05 LAB — TSH: TSH: 3.54 u[IU]/mL (ref 0.35–4.50)

## 2017-02-05 NOTE — Patient Instructions (Signed)
BEFORE YOU LEAVE: -follow up: schedule annual exam in July or August -labs  Call today to schedule your mammogram.  We have ordered labs or studies at this visit. It can take up to 1-2 weeks for results and processing. IF results require follow up or explanation, we will call you with instructions. Clinically stable results will be released to your [facility]. If you have not heard from [place] or cannot find your results in [facility] in 2 weeks please contact our office at [phone].  If you are not yet signed up for [facility], please consider signing up.    We recommend the following healthy lifestyle for LIFE: 1) Small portions.   Tip: eat off of a salad plate instead of a dinner plate.  Tip: It is ok to feel hungry after a meal of proper portion sizes.  Tip: if you need more or a snack choose fruits, veggies and/or a handful of nuts or seeds.  2) Eat a healthy clean diet.  * Tip: Avoid (less then 1 serving per week): processed foods, sweets, sweetened drinks, white starches (rice, flour, bread, potatoes, pasta, etc), red meat, fast foods, butter  *Tip: CHOOSE instead   * 5-9 servings per day of fresh or frozen fruits and vegetables (but not corn, potatoes, bananas, canned or dried fruit)   *nuts and seeds, beans   *olives and olive oil   *small portions of lean meats such as fish and white chicken    *small portions of whole grains  3)Get at least 150 minutes of sweaty aerobic exercise per week.  4)Reduce stress - consider counseling, meditation and relaxation to balance other aspects of your life.

## 2017-02-05 NOTE — Progress Notes (Signed)
Pre visit review using our clinic review tool, if applicable. No additional management support is needed unless otherwise documented below in the visit note. 

## 2017-03-06 ENCOUNTER — Other Ambulatory Visit: Payer: Self-pay | Admitting: Family Medicine

## 2017-05-06 ENCOUNTER — Encounter: Payer: BC Managed Care – PPO | Admitting: Family Medicine

## 2017-07-06 ENCOUNTER — Other Ambulatory Visit: Payer: Self-pay | Admitting: Family Medicine

## 2017-07-20 ENCOUNTER — Other Ambulatory Visit: Payer: Self-pay | Admitting: Family Medicine

## 2017-07-25 ENCOUNTER — Encounter: Payer: Self-pay | Admitting: Family Medicine

## 2017-10-09 NOTE — Progress Notes (Signed)
Medicare Annual Preventive Care Visit  (welcome, initial annual wellness or annual wellness exam)  Concerns and/or follow up today:  Here for follow up and CPE: Hx poor compliance with follow up and refusal of preventive care/very poor compliance with following preventive care recommendations in the past.  Due for mammo, colon ca screening, ? Pap (refused repeat in the past), vaccines (refused in the past), dexa, labs.  -Concerns and/or follow up today:   HTN: -meds: benicar-hctz -stable  HLD/Obesity/Prediabetes: -working on lifestyle - but recently not as good, does not like to take medications  Hypothyroidism: -denies hx of RIA or thyroid surgery -meds: synthroid -stable  Bilateral knee pain/OA: -seeing Dr. Layne Benton when needed -doing exercises for PFS and has severe OA but trying to delay surgery, using meloxicam prn  GAD: -doing well - reports improving -occ panic attacks in the past - understands risks of benzos and reports uses very infrequently -has seen her pastor about this but has not done formal CBT  -Diet: variety of foods, balance and well rounded, larger portion sizes -Exercise: no regular exercise -Taking folic acid, vitamin D or calcium: no -Diabetes and Dyslipidemia Screening: fasting for labs -Vaccines: see vaccine section Epic - refused all vaccines in the past -pap history: 07/2013, normal, refused repeat, reports all normal -FDLMP: see nursing notes -sexual activity: yes, female partner, no new partners -wants STI testing (Hep C if born [birth year]-65): no -FH breast, colon or ovarian ca: see FH Last mammogram:  2016, she was upset with delay in transfer of records so did not return even though 6 month repeat was advised, we advised her to do at last physical but she did not Last colon cancer screening: last colonoloscopy 2008, pt refused 5 year repeat as was for FH and pt denies FH colon cancer - wants to do cologard Breast Ca Risk Assessment: see family  history and pt history DEXA (>/= 97): declined  See HM section in Epic for other details of completed HM. See scanned documentation under Media Tab for further documentation HPI, health risk assessment. See Media Tab and Care Teams sections in Epic for other providers.  ROS: negative for report of fevers, unintentional weight loss, vision changes, vision loss, hearing loss or change, chest pain, sob, hemoptysis, melena, hematochezia, hematuria, genital discharge or lesions, falls, bleeding or bruising, loc, thoughts of suicide or self harm, memory loss  1.) Patient-completed health risk assessment  - completed and reviewed, see scanned documentation  2.) Review of Medical History: -PMH, PSH, Family History and current specialty and care providers reviewed and updated and listed below  - see scanned in document in chart and below  Past Medical History:  Diagnosis Date  . Arthritis    osteo  . Depression   . Epiretinal membrane 02/05/2017   Sees Dr. Valetta Close and Dr. Iona Hansen  . Essential hypertension 04/27/2014  . FH: migraines   . GAD (generalized anxiety disorder) 08/04/2015  . Hepatitis B    reports dx and tx as a young child, reports no issues since and retested as adult and told did not have hepatitis, she is unsure of type of hep she had  . Thyroid disease     Past Surgical History:  Procedure Laterality Date  . APPENDECTOMY    . CATARACT EXTRACTION    . CHOLECYSTECTOMY    . ELBOW SURGERY     shattered elbow  . endometrial oblation    . FOOT SURGERY    . RETINAL DETACHMENT SURGERY    .  TONSILLECTOMY AND ADENOIDECTOMY    . TUBAL LIGATION      Social History   Socioeconomic History  . Marital status: Married    Spouse name: Not on file  . Number of children: Not on file  . Years of education: Not on file  . Highest education level: Not on file  Social Needs  . Financial resource strain: Not on file  . Food insecurity - worry: Not on file  . Food insecurity - inability:  Not on file  . Transportation needs - medical: Not on file  . Transportation needs - non-medical: Not on file  Occupational History  . Not on file  Tobacco Use  . Smoking status: Never Smoker  . Smokeless tobacco: Never Used  Substance and Sexual Activity  . Alcohol use: Yes    Comment: socially  . Drug use: No  . Sexual activity: Not on file  Other Topics Concern  . 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 Prostate cancer Brother        42    Current Outpatient Medications on File Prior to Visit  Medication Sig Dispense Refill  . olmesartan-hydrochlorothiazide (BENICAR HCT) 20-12.5 MG tablet TAKE ONE TABLET BY MOUTH DAILY 90 tablet 1  . SYNTHROID 150 MCG tablet TAKE ONE TABLET BY MOUTH DAILY BEFORE BREAKFAST 90 tablet 1   No current facility-administered medications on file prior to visit.      3.) Review of functional ability and level of safety:  Any difficulty hearing?  See scanned documentation  History of falling?  See scanned documentation  Any trouble with IADLs - using a phone, using transportation, grocery shopping, preparing meals, doing housework, doing laundry, taking medications and managing money?  See scanned documentation  Advance Directives?  Discussed briefly and offered more resources and detailed discussion with our trained staff.   See summary of recommendations in Patient Instructions below.  4.) Physical Exam Vitals:   10/10/17 0707  BP: 100/70  Pulse: 68  Temp: 98 F (36.7 C)   Estimated body mass index is 38.38 kg/m as calculated from the following:   Height as of this encounter: 5' 3.5" (1.613 m).   Weight as of this encounter: 220 lb 1.6 oz (99.8 kg).  EKG (optional):  deferred  General: alert, appear well hydrated and in no acute distress  HEENT: visual acuity grossly intact  CV: HRRR, no LE edema  Lungs: CTA bilaterally  Psych: pleasant and cooperative, no obvious depression or anxiety  Cognitive function grossly intact  See patient instructions for recommendations.  Education and counseling regarding the above review of health provided with a plan for the following: -see scanned patient completed form for further details -fall prevention strategies discussed  -healthy lifestyle discussed -importance and resources for completing advanced directives discussed -see patient instructions below for any other recommendations provided  4)The following written screening schedule of preventive measures were reviewed with assessment and plan made per below, orders and patient instructions:        Alcohol screening done     Obesity Screening and counseling done     STI screening (Hep C if born 1944-65) offered and per  pt wishes     Tobacco Screening done done       Pneumococcal (PPSV23 -one dose after 64, one before if risk factors), influenza yearly and hepatitis B vaccines (if high risk - end stage renal disease, IV drugs, homosexual men, live in home for mentally retarded, hemophilia receiving factors) ASSESSMENT/PLAN: Refused      Screening mammograph (yearly if >40) ASSESSMENT/PLAN: She agrees to try to schedule, the reports she is very busy and is not sure she will get this done, offered to assist in refer, declined      Screening Pap smear/pelvic exam (q2 years) ASSESSMENT/PLAN: n/a, declined          Colorectal cancer screening (FOBT yearly or flex sig q4y or colonoscopy q10y or barium enema q4y) ASSESSMENT/PLAN: Due for screening, she denies a family history of colon cancer, discussed options and she wants to do the Cologuard test, does not wish to do another colonoscopy      Diabetes outpatient self-management training  services ASSESSMENT/PLAN: utd or done      Bone mass measurements(covered q2y if indicated - estrogen def, osteoporosis, hyperparathyroid, vertebral abnormalities, osteoporosis or steroids) ASSESSMENT/PLAN: utd or discussed and ordered per pt wishes      Screening for glaucoma(q1y if high risk - diabetes, FH, AA and > 50 or hispanic and > 65) ASSESSMENT/PLAN: utd or advised, sees Dr. Alonna Minium      Medical nutritional therapy for individuals with diabetes or renal disease ASSESSMENT/PLAN: see orders      Cardiovascular screening blood tests (lipids q5y) ASSESSMENT/PLAN: see orders and labs      Diabetes screening tests ASSESSMENT/PLAN: see orders and labs   7.) Summary:   Welcome to Medicare preventive visit -risk factors and conditions per above assessment were discussed and treatment, recommendations and referrals were offered per documentation above and orders and patient instructions. -Refuses all vaccines -Agrees to Cologuard test and advised my assistant to order this for her -Strongly advised that she do a mammogram, she agrees she will try, but is not sure she has time to do this, offered to refer -she prefers to call  Screening for depression -Negative  Essential hypertension - Plan: Basic metabolic panel, CBC -Not seen in some time, continue medications -Labs -Lifestyle recommendations  Other specified hypothyroidism - Plan: TSH -Labs, adjust medication pending labs as needed  Hyperglycemia - Plan: Hemoglobin A1c -Labs, lifestyle recommendations  Pure hypercholesterolemia - Plan: Lipid panel -Labs, lifestyle recommendations  GAD (generalized anxiety disorder) -Stable  Morbid obesity ([place]) -Lifestyle recommendations time visit  Encounter for hepatitis C virus screening test for high risk patient - Plan: Hepatitis C antibody  Patient Instructions   BEFORE YOU LEAVE: -Order Cologuard test for patient -Labs -follow up: 3-4 months  We ordered the Cologuard  test for colon cancer screening. Please complete this test promptly once the kit arrives. Please contact us if you have not received your kit in the next few weeks.   Ms. Yeaman , Thank you for taking time to come for your Medicare Wellness Visit. I appreciate your ongoing commitment to your health goals. Please review the following plan we discussed and let me know if I can assist you in the future.   These are the goals we discussed: Goals    Schedule mammogram Complete cologuard for colon cancer screening Healthy low sugar diet and regular exercise      This is a list of the screening recommended for you and due dates:  Health Maintenance  Topic  Date Due  . Mammogram  Past due - Pt to schedule  . Colon Cancer Screening  Please complete the cologuard test  . Pneumonia vaccines (1 of 2 - PCV13) refused  . Flu Shot  refused  . HIV Screening  refused  . Tetanus Vaccine  11/05/2022  . DEXA scan (bone density measurement)  Completed  .  Hepatitis C: One time screening is recommended by Center for Disease Control  (CDC) for  adults born from [birth year] through 1964.   Completed today with labs  *Topic was postponed. The date shown is not the original due date.    Health Maintenance for Postmenopausal Women Menopause is a normal process in which your reproductive ability comes to an end. This process happens gradually over a span of months to years, usually between the ages of 90 and 83. Menopause is complete when you have missed 12 consecutive menstrual periods. It is important to talk with your health care provider about some of the most common conditions that affect postmenopausal women, such as heart disease, cancer, and bone loss (osteoporosis). Adopting a healthy lifestyle and getting preventive care can help to promote your health and wellness. Those actions can also lower your chances of developing some of these common conditions. What should I know about menopause? During menopause, you  may experience a number of symptoms, such as:  Moderate-to-severe hot flashes.  Night sweats.  Decrease in sex drive.  Mood swings.  Headaches.  Tiredness.  Irritability.  Memory problems.  Insomnia.  Choosing to treat or not to treat menopausal changes is an individual decision that you make with your health care provider. What should I know about hormone replacement therapy and supplements? Hormone therapy products are effective for treating symptoms that are associated with menopause, such as hot flashes and night sweats. Hormone replacement carries certain risks, especially as you become older. If you are thinking about using estrogen or estrogen with progestin treatments, discuss the benefits and risks with your health care provider. What should I know about heart disease and stroke? Heart disease, heart attack, and stroke become more likely as you age. This may be due, in part, to the hormonal changes that your body experiences during menopause. These can affect how your body processes dietary fats, triglycerides, and cholesterol. Heart attack and stroke are both medical emergencies. There are many things that you can do to help prevent heart disease and stroke:  Have your blood pressure checked at least every 1-2 years. High blood pressure causes heart disease and increases the risk of stroke.  If you are 28-59 years old, ask your health care provider if you should take aspirin to prevent a heart attack or a stroke.  Do not use any tobacco products, including cigarettes, chewing tobacco, or electronic cigarettes. If you need help quitting, ask your health care provider.  It is important to eat a healthy diet and maintain a healthy weight. ? Be sure to include plenty of vegetables, fruits, low-fat dairy products, and lean protein. ? Avoid eating foods that are high in solid fats, added sugars, or salt (sodium).  Get regular exercise. This is one of the most important things  that you can do for your health. ? Try to exercise for at least 150 minutes each week. The type of exercise that you do should increase your heart rate and make you sweat. This is known as moderate-intensity exercise. ? Try to do strengthening exercises at least twice each week. Do these in addition  to the moderate-intensity exercise.  Know your numbers.Ask your health care provider to check your cholesterol and your blood glucose. Continue to have your blood tested as directed by your health care provider.  What should I know about cancer screening? There are several types of cancer. Take the following steps to reduce your risk and to catch any cancer development as early as possible. Breast Cancer  Practice breast self-awareness. ? This means understanding how your breasts normally appear and feel. ? It also means doing regular breast self-exams. Let your health care provider know about any changes, no matter how small.  If you are 36 or older, have a clinician do a breast exam (clinical breast exam or CBE) every year. Depending on your age, family history, and medical history, it may be recommended that you also have a yearly breast X-ray (mammogram).  If you have a family history of breast cancer, talk with your health care provider about genetic screening.  If you are at high risk for breast cancer, talk with your health care provider about having an MRI and a mammogram every year.  Breast cancer (BRCA) gene test is recommended for women who have family members with BRCA-related cancers. Results of the assessment will determine the need for genetic counseling and BRCA1 and for BRCA2 testing. BRCA-related cancers include these types: ? Breast. This occurs in males or females. ? Ovarian. ? Tubal. This may also be called fallopian tube cancer. ? Cancer of the abdominal or pelvic lining (peritoneal cancer). ? Prostate. ? Pancreatic.  Cervical, Uterine, and Ovarian Cancer Your health  care provider may recommend that you be screened regularly for cancer of the pelvic organs. These include your ovaries, uterus, and vagina. This screening involves a pelvic exam, which includes checking for microscopic changes to the surface of your cervix (Pap test).  For women ages 21-65, health care providers may recommend a pelvic exam and a Pap test every three years. For women ages 35-65, they may recommend the Pap test and pelvic exam, combined with testing for human papilloma virus (HPV), every five years. Some types of HPV increase your risk of cervical cancer. Testing for HPV may also be done on women of any age who have unclear Pap test results.  Other health care providers may not recommend any screening for nonpregnant women who are considered low risk for pelvic cancer and have no symptoms. Ask your health care provider if a screening pelvic exam is right for you.  If you have had past treatment for cervical cancer or a condition that could lead to cancer, you need Pap tests and screening for cancer for at least 20 years after your treatment. If Pap tests have been discontinued for you, your risk factors (such as having a new sexual partner) need to be reassessed to determine if you should start having screenings again. Some women have medical problems that increase the chance of getting cervical cancer. In these cases, your health care provider may recommend that you have screening and Pap tests more often.  If you have a family history of uterine cancer or ovarian cancer, talk with your health care provider about genetic screening.  If you have vaginal bleeding after reaching menopause, tell your health care provider.  There are currently no reliable tests available to screen for ovarian cancer.  Lung Cancer Lung cancer screening is recommended for adults 90-59 years old who are at high risk for lung cancer because of a history of smoking. A yearly low-dose  CT scan of the lungs is  recommended if you:  Currently smoke.  Have a history of at least 30 pack-years of smoking and you currently smoke or have quit within the past 15 years. A pack-year is smoking an average of one pack of cigarettes per day for one year.  Yearly screening should:  Continue until it has been 15 years since you quit.  Stop if you develop a health problem that would prevent you from having lung cancer treatment.  Colorectal Cancer  This type of cancer can be detected and can often be prevented.  Routine colorectal cancer screening usually begins at age 43 and continues through age 29.  If you have risk factors for colon cancer, your health care provider may recommend that you be screened at an earlier age.  If you have a family history of colorectal cancer, talk with your health care provider about genetic screening.  Your health care provider may also recommend using home test kits to check for hidden blood in your stool.  A small camera at the end of a tube can be used to examine your colon directly (sigmoidoscopy or colonoscopy). This is done to check for the earliest forms of colorectal cancer.  Direct examination of the colon should be repeated every 5-10 years until age 77. However, if early forms of precancerous polyps or small growths are found or if you have a family history or genetic risk for colorectal cancer, you may need to be screened more often.  Skin Cancer  Check your skin from head to toe regularly.  Monitor any moles. Be sure to tell your health care provider: ? About any new moles or changes in moles, especially if there is a change in a mole's shape or color. ? If you have a mole that is larger than the size of a pencil eraser.  If any of your family members has a history of skin cancer, especially at a young age, talk with your health care provider about genetic screening.  Always use sunscreen. Apply sunscreen liberally and repeatedly throughout the  day.  Whenever you are outside, protect yourself by wearing long sleeves, pants, a wide-brimmed hat, and sunglasses.  What should I know about osteoporosis? Osteoporosis is a condition in which bone destruction happens more quickly than new bone creation. After menopause, you may be at an increased risk for osteoporosis. To help prevent osteoporosis or the bone fractures that can happen because of osteoporosis, the following is recommended:  If you are 22-50 years old, get at least 1,000 mg of calcium and at least 600 mg of vitamin D per day.  If you are older than age 28 but younger than age 80, get at least 1,200 mg of calcium and at least 600 mg of vitamin D per day.  If you are older than age 56, get at least 1,200 mg of calcium and at least 800 mg of vitamin D per day.  Smoking and excessive alcohol intake increase the risk of osteoporosis. Eat foods that are rich in calcium and vitamin D, and do weight-bearing exercises several times each week as directed by your health care provider. What should I know about how menopause affects my mental health? Depression may occur at any age, but it is more common as you become older. Common symptoms of depression include:  Low or sad mood.  Changes in sleep patterns.  Changes in appetite or eating patterns.  Feeling an overall lack of motivation or enjoyment of  activities that you previously enjoyed.  Frequent crying spells.  Talk with your health care provider if you think that you are experiencing depression. What should I know about immunizations? It is important that you get and maintain your immunizations. These include:  Tetanus, diphtheria, and pertussis (Tdap) booster vaccine.  Influenza every year before the flu season begins.  Pneumonia vaccine.  Shingles vaccine.  Your health care provider may also recommend other immunizations. This information is not intended to replace advice given to you by your health care provider.  Make sure you discuss any questions you have with your health care provider. Document Released: 12/14/2005 Document Revised: 05/11/2016 Document Reviewed: 07/26/2015 Elsevier Interactive Patient Education  2018 [place]., DO

## 2017-10-10 ENCOUNTER — Encounter: Payer: Self-pay | Admitting: Family Medicine

## 2017-10-10 ENCOUNTER — Ambulatory Visit (INDEPENDENT_AMBULATORY_CARE_PROVIDER_SITE_OTHER): Payer: Medicare Other | Admitting: Family Medicine

## 2017-10-10 VITALS — BP 100/70 | HR 68 | Temp 98.0°F | Ht 63.5 in | Wt 220.1 lb

## 2017-10-10 DIAGNOSIS — I10 Essential (primary) hypertension: Secondary | ICD-10-CM

## 2017-10-10 DIAGNOSIS — E038 Other specified hypothyroidism: Secondary | ICD-10-CM | POA: Diagnosis not present

## 2017-10-10 DIAGNOSIS — Z1159 Encounter for screening for other viral diseases: Secondary | ICD-10-CM

## 2017-10-10 DIAGNOSIS — F411 Generalized anxiety disorder: Secondary | ICD-10-CM

## 2017-10-10 DIAGNOSIS — E7800 Pure hypercholesterolemia, unspecified: Secondary | ICD-10-CM

## 2017-10-10 DIAGNOSIS — Z0000 Encounter for general adult medical examination without abnormal findings: Secondary | ICD-10-CM | POA: Diagnosis not present

## 2017-10-10 DIAGNOSIS — Z1331 Encounter for screening for depression: Secondary | ICD-10-CM

## 2017-10-10 DIAGNOSIS — Z9189 Other specified personal risk factors, not elsewhere classified: Secondary | ICD-10-CM

## 2017-10-10 DIAGNOSIS — R739 Hyperglycemia, unspecified: Secondary | ICD-10-CM

## 2017-10-10 DIAGNOSIS — Z1389 Encounter for screening for other disorder: Secondary | ICD-10-CM | POA: Diagnosis not present

## 2017-10-10 LAB — BASIC METABOLIC PANEL
BUN: 12 mg/dL (ref 6–23)
CALCIUM: 9.2 mg/dL (ref 8.4–10.5)
CHLORIDE: 104 meq/L (ref 96–112)
CO2: 26 mEq/L (ref 19–32)
CREATININE: 0.82 mg/dL (ref 0.40–1.20)
GFR: 74.24 mL/min (ref >60.00)
Glucose, Bld: 99 mg/dL (ref 70–99)
Potassium: 4 mEq/L (ref 3.5–5.1)
Sodium: 139 mEq/L (ref 135–145)

## 2017-10-10 LAB — LIPID PANEL
CHOLESTEROL: 203 mg/dL — AB (ref 0–200)
HDL: 44.3 mg/dL (ref >39.00)
LDL Cholesterol: 127 mg/dL — ABNORMAL HIGH (ref 0–99)
NONHDL: 158.69
Total CHOL/HDL Ratio: 5
Triglycerides: 158 mg/dL — ABNORMAL HIGH (ref 0.0–149.0)
VLDL: 31.6 mg/dL (ref 0.0–40.0)

## 2017-10-10 LAB — HEMOGLOBIN A1C: HEMOGLOBIN A1C: 5.9 % (ref 4.6–6.5)

## 2017-10-10 LAB — CBC
HCT: 40.2 % (ref 36.0–46.0)
Hemoglobin: 13.4 g/dL (ref 12.0–15.0)
MCHC: 33.3 g/dL (ref 30.0–36.0)
MCV: 87.6 fl (ref 78.0–100.0)
Platelets: 253 10*3/uL (ref 150.0–400.0)
RBC: 4.59 Mil/uL (ref 3.87–5.11)
RDW: 12.9 % (ref 11.5–15.5)
WBC: 7.6 10*3/uL (ref 4.0–10.5)

## 2017-10-10 LAB — TSH: TSH: 4.53 u[IU]/mL — ABNORMAL HIGH (ref 0.35–4.50)

## 2017-10-10 NOTE — Patient Instructions (Signed)
BEFORE YOU LEAVE: -Order Cologuard test for patient -Labs -follow up: 3-4 months  We ordered the Cologuard test for colon cancer screening. Please complete this test promptly once the kit arrives. Please contact us if you have not received your kit in the next few weeks.   Karina Allen , Thank you for taking time to come for your Medicare Wellness Visit. I appreciate your ongoing commitment to your health goals. Please review the following plan we discussed and let me know if I can assist you in the future.   These are the goals we discussed: Goals    Schedule mammogram Complete cologuard for colon cancer screening Healthy low sugar diet and regular exercise      This is a list of the screening recommended for you and due dates:  Health Maintenance  Topic Date Due  . Mammogram  Past due - Pt to schedule  . Colon Cancer Screening  Please complete the cologuard test  . Pneumonia vaccines (1 of 2 - PCV13) refused  . Flu Shot  refused  . HIV Screening  refused  . Tetanus Vaccine  11/05/2022  . DEXA scan (bone density measurement)  Completed  .  Hepatitis C: One time screening is recommended by Center for Disease Control  (CDC) for  adults born from [birth year] through 1964.   Completed today with labs  *Topic was postponed. The date shown is not the original due date.    Health Maintenance for Postmenopausal Women Menopause is a normal process in which your reproductive ability comes to an end. This process happens gradually over a span of months to years, usually between the ages of 56 and 32. Menopause is complete when you have missed 12 consecutive menstrual periods. It is important to talk with your health care provider about some of the most common conditions that affect postmenopausal women, such as heart disease, cancer, and bone loss (osteoporosis). Adopting a healthy lifestyle and getting preventive care can help to promote your health and wellness. Those actions can also lower your  chances of developing some of these common conditions. What should I know about menopause? During menopause, you may experience a number of symptoms, such as:  Moderate-to-severe hot flashes.  Night sweats.  Decrease in sex drive.  Mood swings.  Headaches.  Tiredness.  Irritability.  Memory problems.  Insomnia.  Choosing to treat or not to treat menopausal changes is an individual decision that you make with your health care provider. What should I know about hormone replacement therapy and supplements? Hormone therapy products are effective for treating symptoms that are associated with menopause, such as hot flashes and night sweats. Hormone replacement carries certain risks, especially as you become older. If you are thinking about using estrogen or estrogen with progestin treatments, discuss the benefits and risks with your health care provider. What should I know about heart disease and stroke? Heart disease, heart attack, and stroke become more likely as you age. This may be due, in part, to the hormonal changes that your body experiences during menopause. These can affect how your body processes dietary fats, triglycerides, and cholesterol. Heart attack and stroke are both medical emergencies. There are many things that you can do to help prevent heart disease and stroke:  Have your blood pressure checked at least every 1-2 years. High blood pressure causes heart disease and increases the risk of stroke.  If you are 14-39 years old, ask your health care provider if you should take aspirin to prevent  a heart attack or a stroke.  Do not use any tobacco products, including cigarettes, chewing tobacco, or electronic cigarettes. If you need help quitting, ask your health care provider.  It is important to eat a healthy diet and maintain a healthy weight. ? Be sure to include plenty of vegetables, fruits, low-fat dairy products, and lean protein. ? Avoid eating foods that are  high in solid fats, added sugars, or salt (sodium).  Get regular exercise. This is one of the most important things that you can do for your health. ? Try to exercise for at least 150 minutes each week. The type of exercise that you do should increase your heart rate and make you sweat. This is known as moderate-intensity exercise. ? Try to do strengthening exercises at least twice each week. Do these in addition to the moderate-intensity exercise.  Know your numbers.Ask your health care provider to check your cholesterol and your blood glucose. Continue to have your blood tested as directed by your health care provider.  What should I know about cancer screening? There are several types of cancer. Take the following steps to reduce your risk and to catch any cancer development as early as possible. Breast Cancer  Practice breast self-awareness. ? This means understanding how your breasts normally appear and feel. ? It also means doing regular breast self-exams. Let your health care provider know about any changes, no matter how small.  If you are 63 or older, have a clinician do a breast exam (clinical breast exam or CBE) every year. Depending on your age, family history, and medical history, it may be recommended that you also have a yearly breast X-ray (mammogram).  If you have a family history of breast cancer, talk with your health care provider about genetic screening.  If you are at high risk for breast cancer, talk with your health care provider about having an MRI and a mammogram every year.  Breast cancer (BRCA) gene test is recommended for women who have family members with BRCA-related cancers. Results of the assessment will determine the need for genetic counseling and BRCA1 and for BRCA2 testing. BRCA-related cancers include these types: ? Breast. This occurs in males or females. ? Ovarian. ? Tubal. This may also be called fallopian tube cancer. ? Cancer of the abdominal or  pelvic lining (peritoneal cancer). ? Prostate. ? Pancreatic.  Cervical, Uterine, and Ovarian Cancer Your health care provider may recommend that you be screened regularly for cancer of the pelvic organs. These include your ovaries, uterus, and vagina. This screening involves a pelvic exam, which includes checking for microscopic changes to the surface of your cervix (Pap test).  For women ages 21-65, health care providers may recommend a pelvic exam and a Pap test every three years. For women ages 2-65, they may recommend the Pap test and pelvic exam, combined with testing for human papilloma virus (HPV), every five years. Some types of HPV increase your risk of cervical cancer. Testing for HPV may also be done on women of any age who have unclear Pap test results.  Other health care providers may not recommend any screening for nonpregnant women who are considered low risk for pelvic cancer and have no symptoms. Ask your health care provider if a screening pelvic exam is right for you.  If you have had past treatment for cervical cancer or a condition that could lead to cancer, you need Pap tests and screening for cancer for at least 20 years after your  treatment. If Pap tests have been discontinued for you, your risk factors (such as having a new sexual partner) need to be reassessed to determine if you should start having screenings again. Some women have medical problems that increase the chance of getting cervical cancer. In these cases, your health care provider may recommend that you have screening and Pap tests more often.  If you have a family history of uterine cancer or ovarian cancer, talk with your health care provider about genetic screening.  If you have vaginal bleeding after reaching menopause, tell your health care provider.  There are currently no reliable tests available to screen for ovarian cancer.  Lung Cancer Lung cancer screening is recommended for adults 4-12 years old  who are at high risk for lung cancer because of a history of smoking. A yearly low-dose CT scan of the lungs is recommended if you:  Currently smoke.  Have a history of at least 30 pack-years of smoking and you currently smoke or have quit within the past 15 years. A pack-year is smoking an average of one pack of cigarettes per day for one year.  Yearly screening should:  Continue until it has been 15 years since you quit.  Stop if you develop a health problem that would prevent you from having lung cancer treatment.  Colorectal Cancer  This type of cancer can be detected and can often be prevented.  Routine colorectal cancer screening usually begins at age 15 and continues through age 60.  If you have risk factors for colon cancer, your health care provider may recommend that you be screened at an earlier age.  If you have a family history of colorectal cancer, talk with your health care provider about genetic screening.  Your health care provider may also recommend using home test kits to check for hidden blood in your stool.  A small camera at the end of a tube can be used to examine your colon directly (sigmoidoscopy or colonoscopy). This is done to check for the earliest forms of colorectal cancer.  Direct examination of the colon should be repeated every 5-10 years until age 59. However, if early forms of precancerous polyps or small growths are found or if you have a family history or genetic risk for colorectal cancer, you may need to be screened more often.  Skin Cancer  Check your skin from head to toe regularly.  Monitor any moles. Be sure to tell your health care provider: ? About any new moles or changes in moles, especially if there is a change in a mole's shape or color. ? If you have a mole that is larger than the size of a pencil eraser.  If any of your family members has a history of skin cancer, especially at a young age, talk with your health care provider  about genetic screening.  Always use sunscreen. Apply sunscreen liberally and repeatedly throughout the day.  Whenever you are outside, protect yourself by wearing long sleeves, pants, a wide-brimmed hat, and sunglasses.  What should I know about osteoporosis? Osteoporosis is a condition in which bone destruction happens more quickly than new bone creation. After menopause, you may be at an increased risk for osteoporosis. To help prevent osteoporosis or the bone fractures that can happen because of osteoporosis, the following is recommended:  If you are 32-10 years old, get at least 1,000 mg of calcium and at least 600 mg of vitamin D per day.  If you are older than age  19 but younger than age 48, get at least 1,200 mg of calcium and at least 600 mg of vitamin D per day.  If you are older than age 13, get at least 1,200 mg of calcium and at least 800 mg of vitamin D per day.  Smoking and excessive alcohol intake increase the risk of osteoporosis. Eat foods that are rich in calcium and vitamin D, and do weight-bearing exercises several times each week as directed by your health care provider. What should I know about how menopause affects my mental health? Depression may occur at any age, but it is more common as you become older. Common symptoms of depression include:  Low or sad mood.  Changes in sleep patterns.  Changes in appetite or eating patterns.  Feeling an overall lack of motivation or enjoyment of activities that you previously enjoyed.  Frequent crying spells.  Talk with your health care provider if you think that you are experiencing depression. What should I know about immunizations? It is important that you get and maintain your immunizations. These include:  Tetanus, diphtheria, and pertussis (Tdap) booster vaccine.  Influenza every year before the flu season begins.  Pneumonia vaccine.  Shingles vaccine.  Your health care provider may also recommend other  immunizations. This information is not intended to replace advice given to you by your health care provider. Make sure you discuss any questions you have with your health care provider. Document Released: 12/14/2005 Document Revised: 05/11/2016 Document Reviewed: 07/26/2015 Elsevier Interactive Patient Education  2018 [employer].

## 2017-10-11 LAB — HEPATITIS C ANTIBODY
Hepatitis C Ab: NONREACTIVE
SIGNAL TO CUT-OFF: 0.02 (ref <1.00)

## 2017-10-21 NOTE — Addendum Note (Signed)
Addended by: Agnes Lawrence on: 10/21/2017 04:05 PM   Modules accepted: Orders

## 2017-11-14 ENCOUNTER — Encounter: Payer: Self-pay | Admitting: Family Medicine

## 2017-12-02 ENCOUNTER — Other Ambulatory Visit (INDEPENDENT_AMBULATORY_CARE_PROVIDER_SITE_OTHER): Payer: Medicare Other

## 2017-12-02 ENCOUNTER — Telehealth: Payer: Self-pay | Admitting: Family Medicine

## 2017-12-02 DIAGNOSIS — E038 Other specified hypothyroidism: Secondary | ICD-10-CM

## 2017-12-02 LAB — TSH: TSH: 5.51 u[IU]/mL — AB (ref 0.35–4.50)

## 2017-12-02 NOTE — Telephone Encounter (Signed)
Pt called because her TSH labs were high; she states that she has been drinking grapefruit juice every morning; she is wondering if this could effect her labs? Pt says that this past month has been very stressful; [facility] for final dispositionPt can be contacted at [phone].

## 2017-12-02 NOTE — Addendum Note (Signed)
Addended by: Agnes Lawrence on: 12/02/2017 04:32 PM   Modules accepted: Orders

## 2017-12-02 NOTE — Telephone Encounter (Signed)
I called the pt and informed her of the message below and she stated she will take the medication as instructed.  Patient stated she will call back back for a lab appt in 6 weeks.

## 2017-12-02 NOTE — Telephone Encounter (Signed)
If drinking grapefruit juice within 4 hours of taking her Synthroid, this could impact it.  Many things can impact how effective the Synthroid is.  That is why it is recommended to take it very early in the morning several hours before eating breakfast or taking any other medications. If she has been drinking grapefruit juice within 4 hours of taking her Synthroid, she could try stopping this and  recheck of the thyroid level in 6 weeks instead of changing her dose.

## 2017-12-03 ENCOUNTER — Telehealth: Payer: Self-pay

## 2017-12-03 NOTE — Telephone Encounter (Signed)
Call to [place] to discuss Advanced Directives since I missed her Welcome to Medicare visit in Dec. States her spouse just had CABG Jan 3 and she lost her mother on jan 5th. Feeling a little overwhelmed.  Will leave AD / HCPOA from [facility] for her when she comes in to see Dr. Maudie Mercury in April and to call me for questions she may have.   Wynetta Fines RN

## 2018-01-08 ENCOUNTER — Other Ambulatory Visit: Payer: Self-pay | Admitting: Family Medicine

## 2018-01-13 ENCOUNTER — Other Ambulatory Visit (INDEPENDENT_AMBULATORY_CARE_PROVIDER_SITE_OTHER): Payer: Medicare Other

## 2018-01-13 DIAGNOSIS — E038 Other specified hypothyroidism: Secondary | ICD-10-CM

## 2018-01-13 LAB — TSH: TSH: 5.39 u[IU]/mL — AB (ref 0.35–4.50)

## 2018-02-10 ENCOUNTER — Ambulatory Visit: Payer: Medicare Other | Admitting: Family Medicine

## 2018-02-12 NOTE — Progress Notes (Signed)
HPI:  Using dictation device. Unfortunately this device frequently misinterprets words/phrases.  Karina Allen is a pleasant 66 y.o. here for follow up. Chronic medical problems summarized below were reviewed for changes. She has unfortunately had a rough last 4 months. Her mother passed away and her husband has heart disease. She is coping ok she feels and does not feel needs hel[. Reports has great family support and has been exercising with her daughter. Denies CP, SOB, DOE, treatment intolerance or new symptoms. Due for labs (bmp, cbc, tsh)  AWV 10/2017  HTN: -meds: benicar-hctz -stable  HLD/Obesity/Prediabetes: -working on lifestyle  Hypothyroidism: -denies hx of RIA or thyroid surgery -meds: synthroid -stable  Bilateral knee pain/OA: -saw Dr. Layne Benton when needed -hx PFS and has severe OA but trying to delay surgery, using meloxicam prn   ROS: See pertinent positives and negatives per HPI.  Past Medical History:  Diagnosis Date  . Arthritis    osteo  . Depression   . Epiretinal membrane 02/05/2017   Sees Dr. Valetta Close and Dr. Iona Hansen  . Essential hypertension 04/27/2014  . FH: migraines   . GAD (generalized anxiety disorder) 08/04/2015  . Hepatitis B    reports dx and tx as a young child, reports no issues since and retested as adult and told did not have hepatitis, she is unsure of type of hep she had  . Thyroid disease     Past Surgical History:  Procedure Laterality Date  . APPENDECTOMY    . CATARACT EXTRACTION    . CHOLECYSTECTOMY    . ELBOW SURGERY     shattered elbow  . endometrial oblation    . FOOT SURGERY    . RETINAL DETACHMENT SURGERY    . TONSILLECTOMY AND ADENOIDECTOMY    . TUBAL LIGATION      Family History  Problem Relation Age of Onset  . Prostate cancer Brother        63    SOCIAL HX: see above   Current Outpatient Medications:  .  olmesartan-hydrochlorothiazide (BENICAR HCT) 20-12.5 MG tablet, TAKE ONE TABLET BY MOUTH DAILY, Disp: 90  tablet, Rfl: 1 .  SYNTHROID 150 MCG tablet, TAKE ONE TABLET BY MOUTH DAILY BEFORE BREAKFAST, Disp: 90 tablet, Rfl: 1  EXAM:  Vitals:   02/13/18 0854  BP: (!) 94/58  Pulse: 62  Temp: 97.8 F (36.6 C)    Body mass index is 36.3 kg/m.  GENERAL: vitals reviewed and listed above, alert, oriented, appears well hydrated and in no acute distress  HEENT: atraumatic, conjunttiva clear, no obvious abnormalities on inspection of external nose and ears  NECK: no obvious masses on inspection  LUNGS: clear to auscultation bilaterally, no wheezes, rales or rhonchi, good air movement  CV: HRRR, no peripheral edema  MS: moves all extremities without noticeable abnormality  PSYCH: pleasant and cooperative, no obvious depression or anxiety  ASSESSMENT AND PLAN:  Discussed the following assessment and plan:  Essential hypertension - Plan: Basic metabolic panel, CBC  Hyperglycemia - Plan: Hemoglobin A1c  Other specified hypothyroidism - Plan: TSH  Bereavement  Morbid obesity ([place])  -has continued exercise despite stressors, congratulated and supported -labs -offered bereavement help/CBT - she feels does not need currently -advised of hm due and offered to order/refer per her preferences -advised assistant to order cologuard -pt wishes to call to schedule mammo -follow up 3-4 months, sooner as needed  Patient Instructions  BEFORE YOU LEAVE: -order cologuard and give her brochure -labs -follow up:  follow up Dr. Maudie Mercury  in 3 months\  Please schedule your mammogram.  We ordered the Cologuard test for colon cancer screening. Please check with your insurance on coverage. Please complete this test promptly once the kit arrives. Please contact us if you have not received your kit in the next few weeks.   We have ordered labs or studies at this visit. It can take up to 1-2 weeks for results and processing. IF results require follow up or explanation, we will call you with instructions.  Clinically stable results will be released to your [facility]. If you have not heard from [place] or cannot find your results in [facility] in 2 weeks please contact our office at [phone].  If you are not yet signed up for [facility], please consider signing up.  Hang in there.          Karina Kern, DO

## 2018-02-13 ENCOUNTER — Encounter: Payer: Self-pay | Admitting: Family Medicine

## 2018-02-13 ENCOUNTER — Ambulatory Visit: Payer: Medicare Other | Admitting: Family Medicine

## 2018-02-13 VITALS — BP 94/58 | HR 62 | Temp 97.8°F | Ht 63.5 in | Wt 208.2 lb

## 2018-02-13 DIAGNOSIS — Z634 Disappearance and death of family member: Secondary | ICD-10-CM

## 2018-02-13 DIAGNOSIS — R739 Hyperglycemia, unspecified: Secondary | ICD-10-CM

## 2018-02-13 DIAGNOSIS — I10 Essential (primary) hypertension: Secondary | ICD-10-CM | POA: Diagnosis not present

## 2018-02-13 DIAGNOSIS — E038 Other specified hypothyroidism: Secondary | ICD-10-CM | POA: Diagnosis not present

## 2018-02-13 LAB — CBC
HCT: 38.2 % (ref 36.0–46.0)
Hemoglobin: 13.2 g/dL (ref 12.0–15.0)
MCHC: 34.6 g/dL (ref 30.0–36.0)
MCV: 85.1 fl (ref 78.0–100.0)
PLATELETS: 234 10*3/uL (ref 150.0–400.0)
RBC: 4.49 Mil/uL (ref 3.87–5.11)
RDW: 13.1 % (ref 11.5–15.5)
WBC: 6.1 10*3/uL (ref 4.0–10.5)

## 2018-02-13 LAB — BASIC METABOLIC PANEL
BUN: 14 mg/dL (ref 6–23)
CALCIUM: 9.2 mg/dL (ref 8.4–10.5)
CO2: 26 mEq/L (ref 19–32)
Chloride: 105 mEq/L (ref 96–112)
Creatinine, Ser: 0.84 mg/dL (ref 0.40–1.20)
GFR: 72.13 mL/min (ref >60.00)
Glucose, Bld: 100 mg/dL — ABNORMAL HIGH (ref 70–99)
Potassium: 3.8 mEq/L (ref 3.5–5.1)
Sodium: 139 mEq/L (ref 135–145)

## 2018-02-13 LAB — HEMOGLOBIN A1C: Hgb A1c MFr Bld: 5.7 % (ref 4.6–6.5)

## 2018-02-13 LAB — TSH: TSH: 0.94 u[IU]/mL (ref 0.35–4.50)

## 2018-02-13 NOTE — Patient Instructions (Addendum)
BEFORE YOU LEAVE: -order cologuard and give her brochure -labs -follow up:  follow up Dr.  in 3 months\  Please schedule your mammogram.  We ordered the Cologuard test for colon cancer screening. Please check with your insurance on coverage. Please complete this test promptly once the kit arrives. Please contact us if you have not received your kit in the next few weeks.   We have ordered labs or studies at this visit. It can take up to 1-2 weeks for results and processing. IF results require follow up or explanation, we will call you with instructions. Clinically stable results will be released to your MYCHART. If you have not heard from us or cannot find your results in MYCHART in 2 weeks please contact our office at [phone].  If you are not yet signed up for MYCHART, please consider signing up.  Hang in there.        

## 2018-02-19 ENCOUNTER — Telehealth: Payer: Self-pay | Admitting: Family Medicine

## 2018-02-19 MED ORDER — LORAZEPAM 0.5 MG PO TABS: ORAL_TABLET | ORAL | 0 refills | Status: DC

## 2018-02-19 NOTE — Telephone Encounter (Signed)
Do not recommend prolonged benzodiazepines for grieving. However, looks like she has been on these in the past.  We can call in limited lorazepam 0.5 mg one every 6-8 hours as needed for severe anxiety #20 with no refill

## 2018-02-19 NOTE — Telephone Encounter (Signed)
I called the pt, informed her of the message below and she is aware the Rx was sent to her pharmacy.

## 2018-02-19 NOTE — Telephone Encounter (Signed)
Darrol Angel friend that is staying with the pt, calling to follow up if something could be sent in for the pt.

## 2018-02-19 NOTE — Telephone Encounter (Signed)
Copied from [place] [phone]. Topic: Quick Communication - See Telephone Encounter >> Feb 19, 2018  8:20 AM Cl[place], Laban Emperor wrote: CRM for notification. See Telephone encounter for: 02/19/18.  Pt would like to know if her PCP could call something for her nerves. Her husband passed away on 02-18-23.  Can contact at [place], [place] [phone] (Phone) [phone] (Fax)

## 2018-05-26 NOTE — Progress Notes (Deleted)
  HPI:  Using dictation device. Unfortunately this device frequently misinterprets words/phrases.  Karina Allen is a pleasant 67 y.o. here for follow up. Chronic medical problems summarized below were reviewed for changes and stability and were updated as needed below. These issues and their treatment remain stable for the most part. ***. Denies CP, SOB, DOE, treatment intolerance or new symptoms. Due for mammogram, question colon cancer screening AWV 10/2017  HTN: -meds: benicar-hctz -stable  HLD/Obesity/Prediabetes: -working on lifestyle  Hypothyroidism: -denies hx of RIA or thyroid surgery -meds: synthroid -stable  Bilateral knee pain/OA: -saw Dr. Layne Benton when needed -hx PFS and has severe OA but trying to delay surgery, using meloxicam prn   ROS: See pertinent positives and negatives per HPI.  Past Medical History:  Diagnosis Date  . Arthritis    osteo  . Depression   . Epiretinal membrane 02/05/2017   Sees Dr. Valetta Close and Dr. Iona Hansen  . Essential hypertension 04/27/2014  . FH: migraines   . GAD (generalized anxiety disorder) 08/04/2015  . Hepatitis B    reports dx and tx as a young child, reports no issues since and retested as adult and told did not have hepatitis, she is unsure of type of hep she had  . Thyroid disease     Past Surgical History:  Procedure Laterality Date  . APPENDECTOMY    . CATARACT EXTRACTION    . CHOLECYSTECTOMY    . ELBOW SURGERY     shattered elbow  . endometrial oblation    . FOOT SURGERY    . RETINAL DETACHMENT SURGERY    . TONSILLECTOMY AND ADENOIDECTOMY    . TUBAL LIGATION      Family History  Problem Relation Age of Onset  . Prostate cancer Brother        5    SOCIAL HX: ***   Current Outpatient Medications:  .  LORazepam (ATIVAN) 0.5 MG tablet, Take 1 tablet every 6-8 hours as needed for severe anxiety, Disp: 20 tablet, Rfl: 0 .  olmesartan-hydrochlorothiazide (BENICAR HCT) 20-12.5 MG tablet, TAKE ONE TABLET BY MOUTH  DAILY, Disp: 90 tablet, Rfl: 1 .  SYNTHROID 150 MCG tablet, TAKE ONE TABLET BY MOUTH DAILY BEFORE BREAKFAST, Disp: 90 tablet, Rfl: 1  EXAM:  There were no vitals filed for this visit.  There is no height or weight on file to calculate BMI.  GENERAL: vitals reviewed and listed above, alert, oriented, appears well hydrated and in no acute distress  HEENT: atraumatic, conjunttiva clear, no obvious abnormalities on inspection of external nose and ears  NECK: no obvious masses on inspection  LUNGS: clear to auscultation bilaterally, no wheezes, rales or rhonchi, good air movement  CV: HRRR, no peripheral edema  MS: moves all extremities without noticeable abnormality *** PSYCH: pleasant and cooperative, no obvious depression or anxiety  ASSESSMENT AND PLAN:  Discussed the following assessment and plan:  No diagnosis found.  *** -Patient advised to return or notify a doctor immediately if symptoms worsen or persist or new concerns arise.  There are no Patient Instructions on file for this visit.  [name], DO

## 2018-05-29 ENCOUNTER — Ambulatory Visit: Payer: Medicare Other | Admitting: Family Medicine

## 2018-07-02 NOTE — Progress Notes (Signed)
HPI:  Using dictation device. Unfortunately this device frequently misinterprets words/phrases.  Karina Allen is a pleasant 67 y.o. here for follow up. Chronic medical problems summarized below were reviewed for changes and stability and were updated as needed below. These issues and their treatment remain stable for the most part.  Unfortunately she is suffering from bereavement -lost her spouse 4 months ago.  This is been very hard, she is emotional and sad frequently.  She gets anxiety as well.  Not currently getting any counseling for this.  Is interested in counseling with hospice.  Poor diet - does not want to do lipids, hgba1c or mammo, etc today due to debridement. Does want to check her thyroid today.  Due for labs, mammo, ? Colonosocpy, pneumococal, influenza AWV 10/2017  HTN: -meds: benicar-hctz -stable  HLD/Obesity/Prediabetes: -Was working on lifestyle, not as much recently with the passing of her spouse, she plans to get back on track when she is feeling better  Hypothyroidism: -denies hx of RIA or thyroid surgery -meds: synthroid -stable  Bilateral knee pain/OA: -saw Dr. Layne Benton when needed -hx PFS and has severe OA but trying to delay surgery, using meloxicam prn -Currently seeing orthopedics for strained knee, using a brace as needed  ROS: See pertinent positives and negatives per HPI.  Past Medical History:  Diagnosis Date  . Arthritis    osteo  . Depression   . Epiretinal membrane 02/05/2017   Sees Dr. Valetta Close and Dr. Iona Hansen  . Essential hypertension 04/27/2014  . FH: migraines   . GAD (generalized anxiety disorder) 08/04/2015  . Hepatitis B    reports dx and tx as a young child, reports no issues since and retested as adult and told did not have hepatitis, she is unsure of type of hep she had  . Thyroid disease     Past Surgical History:  Procedure Laterality Date  . APPENDECTOMY    . CATARACT EXTRACTION    . CHOLECYSTECTOMY    . ELBOW SURGERY     shattered elbow  . endometrial oblation    . FOOT SURGERY    . RETINAL DETACHMENT SURGERY    . TONSILLECTOMY AND ADENOIDECTOMY    . TUBAL LIGATION      Family History  Problem Relation Age of Onset  . Prostate cancer Brother        70    SOCIAL HX: See HPI   Current Outpatient Medications:  .  LORazepam (ATIVAN) 0.5 MG tablet, Take 1 tablet every 6-8 hours as needed for severe anxiety, Disp: 20 tablet, Rfl: 0 .  olmesartan-hydrochlorothiazide (BENICAR HCT) 20-12.5 MG tablet, TAKE ONE TABLET BY MOUTH DAILY, Disp: 90 tablet, Rfl: 1 .  SYNTHROID 150 MCG tablet, TAKE ONE TABLET BY MOUTH DAILY BEFORE BREAKFAST, Disp: 90 tablet, Rfl: 1  EXAM:  Vitals:   07/03/18 1015  BP: 112/72  Pulse: 80  Temp: 97.9 F (36.6 C)    There is no height or weight on file to calculate BMI.  GENERAL: vitals reviewed and listed above, alert, oriented, appears well hydrated and in no acute distress  HEENT: atraumatic, conjunttiva clear, no obvious abnormalities on inspection of external nose and ears  NECK: no obvious masses on inspection  LUNGS: clear to auscultation bilaterally, no wheezes, rales or rhonchi, good air movement  CV: HRRR, no peripheral edema  MS: moves all extremities without noticeable abnormality  PSYCH: pleasant and cooperative, tearful when discussing the loss of her spouse  ASSESSMENT AND PLAN:  Discussed the following  assessment and plan:  Bereavement -listened, counseled, hugged, cried - this is so hard  -Discussed options to assist and she is going to try the counseling and hospice and palliative care -Opted against medications for this at this time -She wants to hold off on other things regarding her health for now because of this, agrees to follow-up in 3 months  Other specified hypothyroidism -Checking thyroid today  Essential hypertension - Plan: Basic metabolic panel -Continue current treatment, labs were ordered  She declined preventive care measures  today, prefers to do this when she is feeling better with the bereavement.   Patient Instructions  BEFORE YOU LEAVE: -labs -follow up: 3-4 months  Hang in there.   We have ordered labs or studies at this visit. It can take up to 1-2 weeks for results and processing. IF results require follow up or explanation, we will call you with instructions. Clinically stable results will be released to your [facility]. If you have not heard from [place] or cannot find your results in [facility] in 2 weeks please contact our office at [phone].  If you are not yet signed up for [facility], please consider signing up.           [name], DO

## 2018-07-03 ENCOUNTER — Ambulatory Visit: Payer: Medicare Other | Admitting: Family Medicine

## 2018-07-03 VITALS — BP 112/72 | HR 80 | Temp 97.9°F

## 2018-07-03 DIAGNOSIS — E038 Other specified hypothyroidism: Secondary | ICD-10-CM | POA: Diagnosis not present

## 2018-07-03 DIAGNOSIS — Z634 Disappearance and death of family member: Secondary | ICD-10-CM

## 2018-07-03 DIAGNOSIS — I10 Essential (primary) hypertension: Secondary | ICD-10-CM

## 2018-07-03 LAB — BASIC METABOLIC PANEL
BUN: 19 mg/dL (ref 6–23)
CO2: 26 meq/L (ref 19–32)
Calcium: 9.1 mg/dL (ref 8.4–10.5)
Chloride: 104 mEq/L (ref 96–112)
Creatinine, Ser: 0.83 mg/dL (ref 0.40–1.20)
GFR: 73.05 mL/min (ref >60.00)
GLUCOSE: 105 mg/dL — AB (ref 70–99)
POTASSIUM: 4 meq/L (ref 3.5–5.1)
SODIUM: 137 meq/L (ref 135–145)

## 2018-07-03 NOTE — Patient Instructions (Signed)
BEFORE YOU LEAVE: -labs -follow up: 3-4 months  Hang in there.   We have ordered labs or studies at this visit. It can take up to 1-2 weeks for results and processing. IF results require follow up or explanation, we will call you with instructions. Clinically stable results will be released to your [facility]. If you have not heard from [place] or cannot find your results in [facility] in 2 weeks please contact our office at [phone].  If you are not yet signed up for [facility], please consider signing up.

## 2018-07-10 ENCOUNTER — Other Ambulatory Visit: Payer: Self-pay | Admitting: Family Medicine

## 2018-10-08 NOTE — Progress Notes (Signed)
HPI:  Using dictation device. Unfortunately this device frequently misinterprets words/phrases.  Karina Allen is a pleasant 67 y.o. here for follow up. Chronic medical problems summarized below were reviewed for changes and stability and were updated as needed below. These issues and their treatment remain stable for the most part. Reports doing better. Doing grief counseling which is helping. Doing water aerobics and really enjoys. does not want to do colonoscopy, but agrees to annual stool cards. Refuses vaccines. agrees to schedule her mammogram. Denies CP, SOB, DOE, treatment intolerance or new symptoms.  HTN: -meds: benicar-hctz -stable  HLD/Obesity/Prediabetes: -Was working on lifestyle, not as much recently with the passing of her spouse, she plans to get back on track when she is feeling better  Hypothyroidism: -denies hx of RIA or thyroid surgery -meds: synthroid -stable  Bilateral knee pain/OA: -sawDr. Layne Benton when needed -hxPFS and has severe OA but trying to delay surgery, using meloxicam prn   ROS: See pertinent positives and negatives per HPI.  Past Medical History:  Diagnosis Date  . Arthritis    osteo  . Depression   . Epiretinal membrane 02/05/2017   Sees Dr. Valetta Close and Dr. Iona Hansen  . Essential hypertension 04/27/2014  . FH: migraines   . GAD (generalized anxiety disorder) 08/04/2015  . Hepatitis B    reports dx and tx as a young child, reports no issues since and retested as adult and told did not have hepatitis, she is unsure of type of hep she had  . Thyroid disease     Past Surgical History:  Procedure Laterality Date  . APPENDECTOMY    . CATARACT EXTRACTION    . CHOLECYSTECTOMY    . ELBOW SURGERY     shattered elbow  . endometrial oblation    . FOOT SURGERY    . RETINAL DETACHMENT SURGERY    . TONSILLECTOMY AND ADENOIDECTOMY    . TUBAL LIGATION      Family History  Problem Relation Age of Onset  . Prostate cancer Brother        35     SOCIAL HX: see hpi   Current Outpatient Medications:  .  LORazepam (ATIVAN) 0.5 MG tablet, Take 1 tablet every 6-8 hours as needed for severe anxiety, Disp: 20 tablet, Rfl: 0 .  olmesartan-hydrochlorothiazide (BENICAR HCT) 20-12.5 MG tablet, TAKE ONE TABLET BY MOUTH DAILY, Disp: 90 tablet, Rfl: 1 .  SYNTHROID 150 MCG tablet, TAKE ONE TABLET BY MOUTH DAILY BEFORE BREAKFAST, Disp: 90 tablet, Rfl: 1  EXAM:  There were no vitals filed for this visit.  There is no height or weight on file to calculate BMI.  GENERAL: vitals reviewed and listed above, alert, oriented, appears well hydrated and in no acute distress  HEENT: atraumatic, conjunttiva clear, no obvious abnormalities on inspection of external nose and ears  NECK: no obvious masses on inspection  LUNGS: clear to auscultation bilaterally, no wheezes, rales or rhonchi, good air movement  CV: HRRR, no peripheral edema  MS: moves all extremities without noticeable abnormality  PSYCH: pleasant and cooperative, no obvious depression or anxiety  ASSESSMENT AND PLAN:  Discussed the following assessment and plan:  Bereavement  Essential hypertension - Plan: Basic metabolic panel, CBC  Hyperglycemia - Plan: Hemoglobin A1c  Morbid obesity ([place])  Pure hypercholesterolemia - Plan: Lipid panel  Other specified hypothyroidism - Plan: TSH  -glad doing better -healthy lifestyle encouraged and congratulated on changes -labs per orders -HM due reviewed and advised: she declines vaccines, declined colonosocpy or cologuard,  but agreed to stool cards - provided, agrees to schedule mammogram -AWV in 3 months, follow up sooner as needed Patient Instructions  BEFORE YOU LEAVE: -stool cards for colon cancer screening -labs -follow up: AWV with Dr. Maudie Mercury in 3 months  Call today  to schedule your mammogram.  Complete the stool cards this week.  We have ordered labs or studies at this visit. It can take up to 1-2 weeks for results  and processing. IF results require follow up or explanation, we will call you with instructions. Clinically stable results will be released to your [facility]. If you have not heard from [place] or cannot find your results in [facility] in 2 weeks please contact our office at [phone].  If you are not yet signed up for [facility], please consider signing up.    We recommend the following healthy lifestyle for LIFE: 1) Small portions. But, make sure to get regular (at least 3 per day), healthy meals and small healthy snacks if needed.  2) Eat a healthy clean diet.   TRY TO EAT: -at least 5-7 servings of low sugar, colorful, and nutrient rich vegetables per day (not corn, potatoes or bananas.) -berries are the best choice if you wish to eat fruit (only eat small amounts if trying to reduce weight)  -lean meets (fish, white meat of chicken or [place]) -vegan proteins for some meals - beans or tofu, whole grains, nuts and seeds -Replace bad fats with good fats - good fats include: fish, nuts and seeds, canola oil, olive oil -small amounts of low fat or non fat dairy -small amounts of100 % whole grains - check the lables -drink plenty of water  AVOID: -SUGAR, sweets, anything with added sugar, corn syrup or sweeteners - must read labels as even foods advertised as "healthy" often are loaded with sugar -if you must have a sweetener, small amounts of stevia may be best -sweetened beverages and artificially sweetened beverages -simple starches (rice, bread, potatoes, pasta, chips, etc - small amounts of 100% whole grains are ok) -red meat, pork, butter -fried foods, fast food, processed food, excessive dairy, eggs and coconut.  3)Get at least 150 minutes of sweaty aerobic exercise per week.  4)Reduce stress - consider counseling, meditation and relaxation to balance other aspects of your life.         [name], DO

## 2018-10-09 ENCOUNTER — Ambulatory Visit: Payer: Medicare Other | Admitting: Family Medicine

## 2018-10-09 DIAGNOSIS — E038 Other specified hypothyroidism: Secondary | ICD-10-CM | POA: Diagnosis not present

## 2018-10-09 DIAGNOSIS — R739 Hyperglycemia, unspecified: Secondary | ICD-10-CM | POA: Diagnosis not present

## 2018-10-09 DIAGNOSIS — I10 Essential (primary) hypertension: Secondary | ICD-10-CM | POA: Diagnosis not present

## 2018-10-09 DIAGNOSIS — E7800 Pure hypercholesterolemia, unspecified: Secondary | ICD-10-CM

## 2018-10-09 DIAGNOSIS — Z634 Disappearance and death of family member: Secondary | ICD-10-CM

## 2018-10-09 NOTE — Patient Instructions (Signed)
BEFORE YOU LEAVE: -stool cards for colon cancer screening -labs -follow up: AWV with Dr. Maudie Mercury in 3 months  Call today  to schedule your mammogram.  Complete the stool cards this week.  We have ordered labs or studies at this visit. It can take up to 1-2 weeks for results and processing. IF results require follow up or explanation, we will call you with instructions. Clinically stable results will be released to your [facility]. If you have not heard from [place] or cannot find your results in [facility] in 2 weeks please contact our office at [phone].  If you are not yet signed up for [facility], please consider signing up.    We recommend the following healthy lifestyle for LIFE: 1) Small portions. But, make sure to get regular (at least 3 per day), healthy meals and small healthy snacks if needed.  2) Eat a healthy clean diet.   TRY TO EAT: -at least 5-7 servings of low sugar, colorful, and nutrient rich vegetables per day (not corn, potatoes or bananas.) -berries are the best choice if you wish to eat fruit (only eat small amounts if trying to reduce weight)  -lean meets (fish, white meat of chicken or [place]) -vegan proteins for some meals - beans or tofu, whole grains, nuts and seeds -Replace bad fats with good fats - good fats include: fish, nuts and seeds, canola oil, olive oil -small amounts of low fat or non fat dairy -small amounts of100 % whole grains - check the lables -drink plenty of water  AVOID: -SUGAR, sweets, anything with added sugar, corn syrup or sweeteners - must read labels as even foods advertised as "healthy" often are loaded with sugar -if you must have a sweetener, small amounts of stevia may be best -sweetened beverages and artificially sweetened beverages -simple starches (rice, bread, potatoes, pasta, chips, etc - small amounts of 100% whole grains are ok) -red meat, pork, butter -fried foods, fast food, processed food, excessive dairy, eggs and  coconut.  3)Get at least 150 minutes of sweaty aerobic exercise per week.  4)Reduce stress - consider counseling, meditation and relaxation to balance other aspects of your life.

## 2018-10-13 ENCOUNTER — Other Ambulatory Visit (INDEPENDENT_AMBULATORY_CARE_PROVIDER_SITE_OTHER): Payer: Medicare Other

## 2018-10-13 DIAGNOSIS — E7800 Pure hypercholesterolemia, unspecified: Secondary | ICD-10-CM | POA: Diagnosis not present

## 2018-10-13 DIAGNOSIS — E038 Other specified hypothyroidism: Secondary | ICD-10-CM

## 2018-10-13 DIAGNOSIS — R739 Hyperglycemia, unspecified: Secondary | ICD-10-CM

## 2018-10-13 DIAGNOSIS — I10 Essential (primary) hypertension: Secondary | ICD-10-CM

## 2018-10-13 LAB — CBC
HCT: 36 % (ref 36.0–46.0)
Hemoglobin: 12.2 g/dL (ref 12.0–15.0)
MCHC: 33.8 g/dL (ref 30.0–36.0)
MCV: 86.5 fl (ref 78.0–100.0)
PLATELETS: 229 10*3/uL (ref 150.0–400.0)
RBC: 4.16 Mil/uL (ref 3.87–5.11)
RDW: 13.1 % (ref 11.5–15.5)
WBC: 7.7 10*3/uL (ref 4.0–10.5)

## 2018-10-13 LAB — LIPID PANEL
CHOLESTEROL: 174 mg/dL (ref 0–200)
HDL: 38.9 mg/dL — ABNORMAL LOW (ref >39.00)
LDL CALC: 113 mg/dL — AB (ref 0–99)
NonHDL: 135.49
TRIGLYCERIDES: 110 mg/dL (ref 0.0–149.0)
Total CHOL/HDL Ratio: 4
VLDL: 22 mg/dL (ref 0.0–40.0)

## 2018-10-13 LAB — BASIC METABOLIC PANEL
BUN: 15 mg/dL (ref 6–23)
CALCIUM: 8.5 mg/dL (ref 8.4–10.5)
CO2: 24 mEq/L (ref 19–32)
CREATININE: 0.72 mg/dL (ref 0.40–1.20)
Chloride: 110 mEq/L (ref 96–112)
GFR: 86 mL/min (ref >60.00)
GLUCOSE: 109 mg/dL — AB (ref 70–99)
Potassium: 4.1 mEq/L (ref 3.5–5.1)
SODIUM: 141 meq/L (ref 135–145)

## 2018-10-13 LAB — HEMOGLOBIN A1C: HEMOGLOBIN A1C: 5.8 % (ref 4.6–6.5)

## 2018-10-13 LAB — TSH: TSH: 2.07 u[IU]/mL (ref 0.35–4.50)

## 2018-11-17 ENCOUNTER — Other Ambulatory Visit: Payer: Self-pay | Admitting: Family Medicine

## 2018-11-17 DIAGNOSIS — Z1231 Encounter for screening mammogram for malignant neoplasm of breast: Secondary | ICD-10-CM

## 2018-12-17 ENCOUNTER — Telehealth: Payer: Self-pay | Admitting: Family Medicine

## 2018-12-17 ENCOUNTER — Other Ambulatory Visit: Payer: Self-pay | Admitting: Family Medicine

## 2018-12-17 ENCOUNTER — Ambulatory Visit
Admission: RE | Admit: 2018-12-17 | Discharge: 2018-12-17 | Disposition: A | Discharge status: Home / Self Care | Payer: Medicare Other | Source: Ambulatory Visit | Attending: Family Medicine | Admitting: Family Medicine

## 2018-12-17 DIAGNOSIS — Z1231 Encounter for screening mammogram for malignant neoplasm of breast: Secondary | ICD-10-CM

## 2018-12-17 DIAGNOSIS — R921 Mammographic calcification found on diagnostic imaging of breast: Secondary | ICD-10-CM

## 2018-12-17 NOTE — Telephone Encounter (Signed)
Per cherish at  [facility] have questions about MM DIGITAL SCREENING BILATERAL    Order Date: 11/17/2018  Please call her a  [phone] need order changed

## 2018-12-22 NOTE — Telephone Encounter (Signed)
See order-faxed and sent to be scanned.

## 2019-01-08 ENCOUNTER — Ambulatory Visit: Payer: Medicare Other

## 2019-01-08 ENCOUNTER — Ambulatory Visit: Payer: Medicare Other | Admitting: Family Medicine

## 2019-01-14 ENCOUNTER — Other Ambulatory Visit: Payer: Self-pay | Admitting: Family Medicine

## 2019-01-16 NOTE — Progress Notes (Deleted)
Subjective:   Karina Allen is a 68 y.o. female who presents for Medicare Annual (Subsequent) preventive examination.  Review of Systems:  No ROS.  Medicare Wellness Visit. Additional risk factors are reflected in the social history.    Sleep patterns: {SX; SLEEP PATTERNS:18802::"feels rested on waking","does not get up to void","gets up *** times nightly to void","sleeps *** hours nightly"}.    Home Safety/Smoke Alarms: Feels safe in home. Smoke alarms in place.  Living environment; residence and Firearm Safety: {Rehab home environment / accessibility:30080::"no firearms","firearms stored safely"}. Seat Belt Safety/Bike Helmet: Wears seat belt.   Female:   Pap-       Mammo-       Dexa scan-        CCS-     Objective:     Vitals: There were no vitals taken for this visit.  There is no height or weight on file to calculate BMI.  No flowsheet data found.  Tobacco Social History   Tobacco Use  Smoking Status Never Smoker  Smokeless Tobacco Never Used     Counseling given: Not Answered   Past Medical History:  Diagnosis Date  . Arthritis    osteo  . Depression   . Epiretinal membrane 02/05/2017   Sees Dr. Valetta Close and Dr. Iona Hansen  . Essential hypertension 04/27/2014  . FH: migraines   . GAD (generalized anxiety disorder) 08/04/2015  . Hepatitis B    reports dx and tx as a young child, reports no issues since and retested as adult and told did not have hepatitis, she is unsure of type of hep she had  . Thyroid disease    Past Surgical History:  Procedure Laterality Date  . APPENDECTOMY    . CATARACT EXTRACTION    . CHOLECYSTECTOMY    . ELBOW SURGERY     shattered elbow  . endometrial oblation    . FOOT SURGERY    . RETINAL DETACHMENT SURGERY    . TONSILLECTOMY AND ADENOIDECTOMY    . TUBAL LIGATION     Family History  Problem Relation Age of Onset  . Prostate cancer Brother        43   Social History   Socioeconomic History  . Marital status: Widowed   Spouse name: Not on file  . Number of children: Not on file  . Years of education: Not on file  . Highest education level: Not on file  Occupational History  . Not on file  Social Needs  . Financial resource strain: Not on file  . Food insecurity:    Worry: Not on file    Inability: Not on file  . Transportation needs:    Medical: Not on file    Non-medical: Not on file  Tobacco Use  . Smoking status: Never Smoker  . Smokeless tobacco: Never Used  Substance and Sexual Activity  . Alcohol use: Yes    Comment: socially  . Drug use: No  . Sexual activity: Not on file  Lifestyle  . Physical activity:    Days per week: Not on file    Minutes per session: Not on file  . Stress: Not on file  Relationships  . Social connections:    Talks on phone: Not on file    Gets together: Not on file    Attends religious service: Not on file    Active member of club or organization: Not on file    Attends meetings of clubs or organizations: Not on file  Relationship status: Not on file  Other Topics Concern  . 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 LORazepam (ATIVAN) 0.5 MG tablet Take 1 tablet every 6-8 hours as needed for severe anxiety  . olmesartan-hydrochlorothiazide (BENICAR HCT) 20-12.5 MG tablet TAKE ONE TABLET BY MOUTH DAILY  . SYNTHROID 150 MCG tablet TAKE ONE TABLET BY MOUTH DAILY BEFORE BREAKFAST   No facility-administered encounter medications on file as of 01/19/2019.     Activities of Daily Living No flowsheet data found.  Patient Care Team: [name], DO as PCP - General (Family Medicine)    Assessment:   This is a routine wellness examination for Karina Allen. Physical assessment deferred to PCP.    Exercise Activities and Dietary recommendations   Diet (meal preparation, eat out, water intake, caffeinated beverages, dairy products, fruits and vegetables): {Desc; diets:16563} :      Goals   None     Fall Risk Fall Risk  10/10/2017  Falls in the past year? No    Depression Screen PHQ 2/9 Scores 10/10/2017  PHQ - 2 Score 0     Cognitive Function        Immunization History  Administered Date(s) Administered  . Tdap 11/05/2012    Qualifies for Shingles Vaccine?  Screening Tests Health Maintenance  Topic Date Due  . PNA vac Low Risk Adult (1 of 2 - PCV13) 04/02/2017  . COLONOSCOPY  08/07/2017  . INFLUENZA VACCINE  08/03/2025 (Originally 06/05/2018)  . MAMMOGRAM  12/17/2020  . TETANUS/TDAP  11/05/2022  . DEXA SCAN  Completed  . Hepatitis C Screening  Completed        Plan:      I have personally reviewed and noted the following in the patient's chart:   . Medical and social history . Use of alcohol, tobacco or illicit drugs  . Current medications and supplements . Functional ability and status . Nutritional status . Physical activity . Advanced directives . List of other physicians . Hospitalizations, surgeries, and ER visits in previous 12 months . Vitals . Screenings to include cognitive, depression, and falls . Referrals and appointments  In addition, I have reviewed and discussed with patient certain preventive protocols, quality metrics, and best practice recommendations. A written personalized care plan for preventive services as well as general preventive health recommendations were provided to patient.     Alphia Moh, RN  01/16/2019

## 2019-01-19 ENCOUNTER — Ambulatory Visit: Payer: Medicare Other | Admitting: Family Medicine

## 2019-01-19 ENCOUNTER — Telehealth: Payer: Self-pay

## 2019-01-19 ENCOUNTER — Ambulatory Visit: Payer: Medicare Other

## 2019-01-19 NOTE — Telephone Encounter (Signed)
Author phoned pt. to see if she would like to reschedule awv and OV with Dr. Maudie Mercury. Pt. Agreed to reschedule OV with Dr. Maudie Mercury, but difficult to coordinate awv on same day per pt. Preference. Pt. Stated she would reschedule awv at another time. Reminder made in [place] appointment notes.

## 2019-02-11 ENCOUNTER — Other Ambulatory Visit: Payer: Self-pay | Admitting: Family Medicine

## 2019-03-05 ENCOUNTER — Encounter: Payer: Self-pay | Admitting: Internal Medicine

## 2019-03-05 ENCOUNTER — Other Ambulatory Visit: Payer: Self-pay

## 2019-03-05 ENCOUNTER — Ambulatory Visit (INDEPENDENT_AMBULATORY_CARE_PROVIDER_SITE_OTHER): Payer: Medicare Other | Admitting: Internal Medicine

## 2019-03-05 DIAGNOSIS — I10 Essential (primary) hypertension: Secondary | ICD-10-CM

## 2019-03-05 DIAGNOSIS — E038 Other specified hypothyroidism: Secondary | ICD-10-CM

## 2019-03-05 DIAGNOSIS — M170 Bilateral primary osteoarthritis of knee: Secondary | ICD-10-CM | POA: Diagnosis not present

## 2019-03-05 DIAGNOSIS — F411 Generalized anxiety disorder: Secondary | ICD-10-CM | POA: Diagnosis not present

## 2019-03-05 MED ORDER — LEVOTHYROXINE SODIUM 150 MCG PO TABS: 150.0000 ug | ORAL_TABLET | Freq: Every day | ORAL | 3 refills | Status: DC

## 2019-03-05 MED ORDER — OLMESARTAN MEDOXOMIL-HCTZ 20-12.5 MG PO TABS: 1.0000 | ORAL_TABLET | Freq: Every day | ORAL | 3 refills | Status: DC

## 2019-03-05 NOTE — Progress Notes (Signed)
Virtual Visit via Telephone Note  I connected with Gregery Na on 03/05/19 at 10:00 AM EDT by telephone and verified that I am speaking with the correct person using two identifiers.   I discussed the limitations, risks, security and privacy concerns of performing an evaluation and management service by telephone and the availability of in person appointments. I also discussed with the patient that there may be a patient responsible charge related to this service. The patient expressed understanding and agreed to proceed.  We initially attempted to connect via video chat but were unable to due to technical difficulties on the patient's end, so we converted this visit to a phone visit.   Location patient: home Location provider: work office Participants present for the call: patient, provider Patient did not have a visit in the prior 7 days to address this/these issue(s).   History of Present Illness:  This visit is to establish care and to follow up on chronic medical conditions.  PMH is significant for:  1. HTN that has been well controlled on Benicar HCT.  2. Hypothyroidism that has been well controlled on synthroid 150 mcg.  3. Chronic left knee OA. Has been seeing Dr. Alvan Dame and received a cotisone injection without much relief. He is considering an MRI in the near future,.  She has NKDA, never smoker, no ETOH use. 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 Unfortunately, her husband of 34 years passed away last year and this has been very hard on her as they used to do everything together. She remains active in her church. Has a good support system with family and friends. Has done some grief counseling thru hospice and is thinking of going back. States the isolation from COVID-19 has been really difficult on her. Denies SI. She is usually physically active and does water aerobics 5 days a week (gym is currently closed due to  coronavirus).  She has no acute complaints today. She is due for her CPE/AWV.   Observations/Objective: Patient sounds cheerful and well on the phone. I do not appreciate any increased work of breathing. Speech and thought processing are grossly intact. Patient reported vitals: none reported   Current Outpatient Medications:  .  levothyroxine (SYNTHROID) 150 MCG tablet, Take 1 tablet (150 mcg total) by mouth daily with breakfast., Disp: 30 tablet, Rfl: 3 .  olmesartan-hydrochlorothiazide (BENICAR HCT) 20-12.5 MG tablet, Take 1 tablet by mouth daily., Disp: 30 tablet, Rfl: 3  Review of Systems:  Constitutional: Denies fever, chills, diaphoresis, appetite change and fatigue.  HEENT: Denies photophobia, eye pain, redness, hearing loss, ear pain, congestion, sore throat, rhinorrhea, sneezing, mouth sores, trouble swallowing, neck pain, neck stiffness and tinnitus.   Respiratory: Denies SOB, DOE, cough, chest tightness,  and wheezing.   Cardiovascular: Denies chest pain, palpitations and leg swelling.  Gastrointestinal: Denies nausea, vomiting, abdominal pain, diarrhea, constipation, blood in stool and abdominal distention.  Genitourinary: Denies dysuria, urgency, frequency, hematuria, flank pain and difficulty urinating.  Endocrine: Denies: hot or cold intolerance, sweats, changes in hair or nails, polyuria, polydipsia. Musculoskeletal: Denies myalgias, back pain, joint swelling, arthralgias and gait problem.  Skin: Denies pallor, rash and wound.  Neurological: Denies dizziness, seizures, syncope, weakness, light-headedness, numbness and headaches.  Hematological: Denies adenopathy. Easy bruising, personal or family bleeding history  Psychiatric/Behavioral: Denies suicidal ideation,  confusion, nervousness, sleep disturbance and agitation   Assessment and Plan:  Essential hypertension  -Has been well controlled in past. -  Have advised ambulatory BP monitoring. -Refill benicar hct.   Other specified hypothyroidism  -Refill synthroid. -Last TSH was 2.070 in 12/19.  Primary osteoarthritis of both knees -Worse in the left. -Followed by ortho with a recent steroid injection without much relief, ortho is considering MRI.  GAD (generalized anxiety disorder) -After death of spouse last year she was given ativan that she is no longer taking. -Is considering resuming CBT sessions after COVID-19 has passed.    I discussed the assessment and treatment plan with the patient. The patient was provided an opportunity to ask questions and all were answered. The patient agreed with the plan and demonstrated an understanding of the instructions.   The patient was advised to call back or seek an in-person evaluation if the symptoms worsen or if the condition fails to improve as anticipated.  I provided 28 minutes of non-face-to-face time during this encounter.   Lelon Frohlich, MD [facility]

## 2019-04-16 ENCOUNTER — Other Ambulatory Visit: Payer: Self-pay | Admitting: Internal Medicine

## 2019-04-16 DIAGNOSIS — E038 Other specified hypothyroidism: Secondary | ICD-10-CM

## 2019-04-16 DIAGNOSIS — I10 Essential (primary) hypertension: Secondary | ICD-10-CM

## 2019-04-20 ENCOUNTER — Other Ambulatory Visit: Payer: Self-pay | Admitting: Internal Medicine

## 2019-04-20 DIAGNOSIS — I10 Essential (primary) hypertension: Secondary | ICD-10-CM

## 2019-06-22 NOTE — Progress Notes (Signed)
Pt had completed on 12/17/18 TOMO scan completed.

## 2019-06-22 NOTE — Progress Notes (Signed)
Pt had done on 10/13/18

## 2019-10-14 ENCOUNTER — Other Ambulatory Visit: Payer: Self-pay

## 2019-10-15 ENCOUNTER — Encounter: Payer: Self-pay | Admitting: Internal Medicine

## 2019-10-15 ENCOUNTER — Ambulatory Visit (INDEPENDENT_AMBULATORY_CARE_PROVIDER_SITE_OTHER): Payer: Medicare Other | Admitting: Internal Medicine

## 2019-10-15 VITALS — BP 130/60 | HR 84 | Temp 97.8°F | Wt 226.9 lb

## 2019-10-15 DIAGNOSIS — E7800 Pure hypercholesterolemia, unspecified: Secondary | ICD-10-CM

## 2019-10-15 DIAGNOSIS — I10 Essential (primary) hypertension: Secondary | ICD-10-CM | POA: Diagnosis not present

## 2019-10-15 DIAGNOSIS — F411 Generalized anxiety disorder: Secondary | ICD-10-CM

## 2019-10-15 DIAGNOSIS — Z0000 Encounter for general adult medical examination without abnormal findings: Secondary | ICD-10-CM

## 2019-10-15 DIAGNOSIS — R7302 Impaired glucose tolerance (oral): Secondary | ICD-10-CM | POA: Insufficient documentation

## 2019-10-15 DIAGNOSIS — E038 Other specified hypothyroidism: Secondary | ICD-10-CM

## 2019-10-15 LAB — LIPID PANEL
Cholesterol: 187 mg/dL (ref 0–200)
HDL: 41 mg/dL (ref >39.00)
LDL Cholesterol: 118 mg/dL — ABNORMAL HIGH (ref 0–99)
NonHDL: 145.6
Total CHOL/HDL Ratio: 5
Triglycerides: 139 mg/dL (ref 0.0–149.0)
VLDL: 27.8 mg/dL (ref 0.0–40.0)

## 2019-10-15 LAB — COMPREHENSIVE METABOLIC PANEL
ALT: 18 U/L (ref 0–35)
AST: 20 U/L (ref 0–37)
Albumin: 4.2 g/dL (ref 3.5–5.2)
Alkaline Phosphatase: 75 U/L (ref 39–117)
BUN: 19 mg/dL (ref 6–23)
CO2: 27 mEq/L (ref 19–32)
Calcium: 9.3 mg/dL (ref 8.4–10.5)
Chloride: 103 mEq/L (ref 96–112)
Creatinine, Ser: 1 mg/dL (ref 0.40–1.20)
GFR: 55.21 mL/min — ABNORMAL LOW (ref >60.00)
Glucose, Bld: 109 mg/dL — ABNORMAL HIGH (ref 70–99)
Potassium: 3.7 mEq/L (ref 3.5–5.1)
Sodium: 139 mEq/L (ref 135–145)
Total Bilirubin: 0.7 mg/dL (ref 0.2–1.2)
Total Protein: 6.7 g/dL (ref 6.0–8.3)

## 2019-10-15 LAB — VITAMIN D 25 HYDROXY (VIT D DEFICIENCY, FRACTURES): VITD: 30.8 ng/mL (ref 30.00–100.00)

## 2019-10-15 LAB — CBC WITH DIFFERENTIAL/PLATELET
Basophils Absolute: 0.1 10*3/uL (ref 0.0–0.1)
Basophils Relative: 0.9 % (ref 0.0–3.0)
Eosinophils Absolute: 0.3 10*3/uL (ref 0.0–0.7)
Eosinophils Relative: 3.7 % (ref 0.0–5.0)
HCT: 37 % (ref 36.0–46.0)
Hemoglobin: 12.6 g/dL (ref 12.0–15.0)
Lymphocytes Relative: 31.1 % (ref 12.0–46.0)
Lymphs Abs: 2.5 10*3/uL (ref 0.7–4.0)
MCHC: 34 g/dL (ref 30.0–36.0)
MCV: 85.7 fl (ref 78.0–100.0)
Monocytes Absolute: 0.6 10*3/uL (ref 0.1–1.0)
Monocytes Relative: 6.9 % (ref 3.0–12.0)
Neutro Abs: 4.5 10*3/uL (ref 1.4–7.7)
Neutrophils Relative %: 57.4 % (ref 43.0–77.0)
Platelets: 257 10*3/uL (ref 150.0–400.0)
RBC: 4.31 Mil/uL (ref 3.87–5.11)
RDW: 13.2 % (ref 11.5–15.5)
WBC: 7.9 10*3/uL (ref 4.0–10.5)

## 2019-10-15 LAB — VITAMIN B12: Vitamin B-12: 445 pg/mL (ref 211–911)

## 2019-10-15 LAB — TSH: TSH: 2.39 u[IU]/mL (ref 0.35–4.50)

## 2019-10-15 LAB — HEMOGLOBIN A1C: Hgb A1c MFr Bld: 5.9 % (ref 4.6–6.5)

## 2019-10-15 NOTE — Patient Instructions (Signed)
-Nice seeing you today!!  -Lab work today; will notify you once results are available.  -Schedule 6 mo follow up visit.   Preventive Care 67 Years and Older, Female Preventive care refers to lifestyle choices and visits with your health care provider that can promote health and wellness. This includes:  A yearly physical exam. This is also called an annual well check.  Regular dental and eye exams.  Immunizations.  Screening for certain conditions.  Healthy lifestyle choices, such as diet and exercise. What can I expect for my preventive care visit? Physical exam Your health care provider will check:  Height and weight. These may be used to calculate body mass index (BMI), which is a measurement that tells if you are at a healthy weight.  Heart rate and blood pressure.  Your skin for abnormal spots. Counseling Your health care provider may ask you questions about:  Alcohol, tobacco, and drug use.  Emotional well-being.  Home and relationship well-being.  Sexual activity.  Eating habits.  History of falls.  Memory and ability to understand (cognition).  Work and work Statistician.  Pregnancy and menstrual history. What immunizations do I need?  Influenza (flu) vaccine  This is recommended every year. Tetanus, diphtheria, and pertussis (Tdap) vaccine  You may need a Td booster every 10 years. Varicella (chickenpox) vaccine  You may need this vaccine if you have not already been vaccinated. Zoster (shingles) vaccine  You may need this after age 53. Pneumococcal conjugate (PCV13) vaccine  One dose is recommended after age 65. Pneumococcal polysaccharide (PPSV23) vaccine  One dose is recommended after age 72. Measles, mumps, and rubella (MMR) vaccine  You may need at least one dose of MMR if you were born in 1957 or later. You may also need a second dose. Meningococcal conjugate (MenACWY) vaccine  You may need this if you have certain conditions.  Hepatitis A vaccine  You may need this if you have certain conditions or if you travel or work in places where you may be exposed to hepatitis A. Hepatitis B vaccine  You may need this if you have certain conditions or if you travel or work in places where you may be exposed to hepatitis B. Haemophilus influenzae type b (Hib) vaccine  You may need this if you have certain conditions. You may receive vaccines as individual doses or as more than one vaccine together in one shot (combination vaccines). Talk with your health care provider about the risks and benefits of combination vaccines. What tests do I need? Blood tests  Lipid and cholesterol levels. These may be checked every 5 years, or more frequently depending on your overall health.  Hepatitis C test.  Hepatitis B test. Screening  Lung cancer screening. You may have this screening every year starting at age 56 if you have a 30-pack-year history of smoking and currently smoke or have quit within the past 15 years.  Colorectal cancer screening. All adults should have this screening starting at age 43 and continuing until age 43. Your health care provider may recommend screening at age 80 if you are at increased risk. You will have tests every 1-10 years, depending on your results and the type of screening test.  Diabetes screening. This is done by checking your blood sugar (glucose) after you have not eaten for a while (fasting). You may have this done every 1-3 years.  Mammogram. This may be done every 1-2 years. Talk with your health care provider about how often you should  have regular mammograms.  BRCA-related cancer screening. This may be done if you have a family history of breast, ovarian, tubal, or peritoneal cancers. Other tests  Sexually transmitted disease (STD) testing.  Bone density scan. This is done to screen for osteoporosis. You may have this done starting at age 65. Follow these instructions at home: Eating  and drinking  Eat a diet that includes fresh fruits and vegetables, whole grains, lean protein, and low-fat dairy products. Limit your intake of foods with high amounts of sugar, saturated fats, and salt.  Take vitamin and mineral supplements as recommended by your health care provider.  Do not drink alcohol if your health care provider tells you not to drink.  If you drink alcohol: ? Limit how much you have to 0-1 drink a day. ? Be aware of how much alcohol is in your drink. In the [place] one drink equals one 12 oz bottle of beer (355 mL), one 5 oz glass of wine (148 mL), or one 1 oz glass of hard liquor (44 mL). Lifestyle  Take daily care of your teeth and gums.  Stay active. Exercise for at least 30 minutes on 5 or more days each week.  Do not use any products that contain nicotine or tobacco, such as cigarettes, e-cigarettes, and chewing tobacco. If you need help quitting, ask your health care provider.  If you are sexually active, practice safe sex. Use a condom or other form of protection in order to prevent STIs (sexually transmitted infections).  Talk with your health care provider about taking a low-dose aspirin or statin. What's next?  Go to your health care provider once a year for a well check visit.  Ask your health care provider how often you should have your eyes and teeth checked.  Stay up to date on all vaccines. This information is not intended to replace advice given to you by your health care provider. Make sure you discuss any questions you have with your health care provider. Document Released: 11/18/2015 Document Revised: 10/16/2018 Document Reviewed: 10/16/2018 Elsevier Patient Education  2020 [employer].

## 2019-10-15 NOTE — Progress Notes (Signed)
Established Patient Office Visit     This visit occurred during the SARS-CoV-2 public health emergency.  Safety protocols were in place, including screening questions prior to the visit, additional usage of staff PPE, and extensive cleaning of exam room while observing appropriate contact time as indicated for disinfecting solutions.    CC/Reason for Visit: Annual preventive exam and subsequent Medicare wellness visit  HPI: Karina Allen is a 68 y.o. female who is coming in today for the above mentioned reasons. Past Medical History is significant for: Hypertension that has been well controlled on olmesartan/hydrochlorothiazide and hypothyroidism well-controlled on Synthroid.  Her husband died in December 23, 2017 and she feels like she is coping fairly well with this.  She has been attending a grief share class through church.  She has good local family and friend support.  She has gained 18 pounds since her last office visit.  She is fasting today.  She has routine eye and dental care, she declines any hearing issues.  She is exercising 5 times a week she does water aerobics at the [facility].  She is declining all vaccinations today.  She is overdue for screening colonoscopy, she needs a DEXA scan, she had a mammogram in 12-23-22 of this year and is due for repeat mammogram in December 11, 2019.   Past Medical/Surgical History: Past Medical History:  Diagnosis Date   Arthritis    osteo   Depression    Epiretinal membrane 02/05/2017   Sees Dr. Valetta Close and Dr. Iona Hansen   Essential hypertension 04/27/2014   FH: migraines    GAD (generalized anxiety disorder) 08/04/2015   Hepatitis B    reports dx and tx as a young child, reports no issues since and retested as adult and told did not have hepatitis, she is unsure of type of hep she had   Thyroid disease     Past Surgical History:  Procedure Laterality Date   APPENDECTOMY     CATARACT EXTRACTION     CHOLECYSTECTOMY     ELBOW SURGERY     shattered elbow   endometrial oblation     FOOT SURGERY     RETINAL DETACHMENT SURGERY     TONSILLECTOMY AND ADENOIDECTOMY     TUBAL LIGATION      Social History:  reports that she has never smoked. She has never used smokeless tobacco. She reports current alcohol use. She reports that she does not use drugs.  Allergies: Allergies  Allergen Reactions   Penicillins     Family History:  Family History  Problem Relation Age of Onset   Prostate cancer Brother        35     Current Outpatient Medications:    olmesartan-hydrochlorothiazide (BENICAR HCT) 20-12.5 MG tablet, TAKE ONE TABLET BY MOUTH DAILY, Disp: 90 tablet, Rfl: 1   SYNTHROID 150 MCG tablet, TAKE ONE TABLET BY MOUTH EVERY MORNING WITH BREAKFAST, Disp: 90 tablet, Rfl: 1  Review of Systems:  Constitutional: Denies fever, chills, diaphoresis, appetite change and fatigue.  HEENT: Denies photophobia, eye pain, redness, hearing loss, ear pain, congestion, sore throat, rhinorrhea, sneezing, mouth sores, trouble swallowing, neck pain, neck stiffness and tinnitus.   Respiratory: Denies SOB, DOE, cough, chest tightness,  and wheezing.   Cardiovascular: Denies chest pain, palpitations and leg swelling.  Gastrointestinal: Denies nausea, vomiting, abdominal pain, diarrhea, constipation, blood in stool and abdominal distention.  Genitourinary: Denies dysuria, urgency, frequency, hematuria, flank pain and difficulty urinating.  Endocrine: Denies: hot or cold intolerance, sweats,  changes in hair or nails, polyuria, polydipsia. Musculoskeletal: Denies myalgias, back pain, joint swelling, arthralgias and gait problem.  Skin: Denies pallor, rash and wound.  Neurological: Denies dizziness, seizures, syncope, weakness, light-headedness, numbness and headaches.  Hematological: Denies adenopathy. Easy bruising, personal or family bleeding history  Psychiatric/Behavioral: Denies suicidal ideation, mood changes, confusion,  nervousness, sleep disturbance and agitation    Physical Exam: Vitals:   10/15/19 0709  BP: 130/60  Pulse: 84  Temp: 97.8 F (36.6 C)  TempSrc: Temporal  SpO2: 97%  Weight: 226 lb 14.4 oz (102.9 kg)    Body mass index is 39.56 kg/m.   Constitutional: NAD, calm, comfortable Eyes:  lids and conjunctivae normal, wears corrective lenses, she has a dilated left pupil that is not responsive to light.  She had a traumatic injury at the age of 3 and has had issues ever since. ENMT: Mucous membranes are moist.  Tympanic membrane is pearly white, no erythema or bulging. Neck: normal, supple, no masses, no thyromegaly Respiratory: clear to auscultation bilaterally, no wheezing, no crackles. Normal respiratory effort. No accessory muscle use.  Cardiovascular: Regular rate and rhythm, no murmurs / rubs / gallops. No extremity edema. 2+ pedal pulses. No carotid bruits.  Abdomen: no tenderness, no masses palpated. No hepatosplenomegaly. Bowel sounds positive.  Musculoskeletal: no clubbing / cyanosis. No joint deformity upper and lower extremities. Good ROM, no contractures. Normal muscle tone.  Skin: no rashes, lesions, ulcers. No induration Neurologic: CN 2-12 grossly intact. Sensation intact, DTR normal. Strength 5/5 in all 4.  Psychiatric: Normal judgment and insight. Alert and oriented x 3. Normal mood.   Subsequent Medicare wellness visit   1. Risk factors, based on past  M,S,F -cardiovascular disease risk factors include history of hypertension and obesity   2.  Physical activities: She does water aerobics 5 times a week and walks with her daughter 2-3 times a week   3.  Depression/mood:  Stable, not depressed coping well with death of her husband last year   42.  Hearing:  No perceived issues   5.  ADL's: Independent in all ADLs   6.  Fall risk:  Low fall risk   7.  Home safety: No problems identified   8.  Height weight, and visual acuity: Height and weight as above, visual  acuity is 20/28 in the right eye and 20/70 in the left eye with correction   9.  Counseling:  We discussed importance of age-appropriate vaccinations, we discussed importance of colon cancer screening   10. Lab orders based on risk factors: Laboratory update will be reviewed   11. Referral :  None today   12. Care plan:  Follow-up with me in 6 months   13. Cognitive assessment:  No cognitive impairment   14. Screening: Patient provided with a written and personalized 5-10 year screening schedule in the AVS.   yes   15. Provider List Update:   PCP only  16. Advance Directives: Full code     Office Visit from 10/15/2019 in [facility]  PHQ-9 Total Score  1      Fall Risk  10/15/2019 10/10/2017  Falls in the past year? 1 No  Number falls in past yr: 1 -  Injury with Fall? 0 -     Impression and Plan:  Encounter for preventive health examination -She has routine eye and dental care. -She has declined all vaccinations today including flu despite extensive counseling on my hand. -Screening labs today. -Healthy lifestyle  has been discussed in detail. -She is way overdue for screening colonoscopy, however does not want me to refer her yet today.  She will think about it and let us know. -She had a mammogram in February that was normal. -She is overdue for DEXA scan, she does not want me to place referral today. -She is overdue for cervical cancer screening which she declines also today despite extensive counseling.  Essential hypertension -Well-controlled on current regimen. -Check renal function and electrolytes due to her ARB/diuretic combination.  Other specified hypothyroidism  -Last TSH was 2.070 in December 2019. -Recheck TSH today, continue Synthroid.  GAD (generalized anxiety disorder) -She is doing well not on medications.  Pure hypercholesterolemia  -Check lipids today, she is not on statins.  Morbid obesity ([place]) -Discussed healthy  lifestyle, including increased physical activity and better food choices to promote weight loss.    Patient Instructions  -Nice seeing you today!!  -Lab work today; will notify you once results are available.  -Schedule 6 mo follow up visit.   Preventive Care 20 Years and Older, Female Preventive care refers to lifestyle choices and visits with your health care provider that can promote health and wellness. This includes:  A yearly physical exam. This is also called an annual well check.  Regular dental and eye exams.  Immunizations.  Screening for certain conditions.  Healthy lifestyle choices, such as diet and exercise. What can I expect for my preventive care visit? Physical exam Your health care provider will check:  Height and weight. These may be used to calculate body mass index (BMI), which is a measurement that tells if you are at a healthy weight.  Heart rate and blood pressure.  Your skin for abnormal spots. Counseling Your health care provider may ask you questions about:  Alcohol, tobacco, and drug use.  Emotional well-being.  Home and relationship well-being.  Sexual activity.  Eating habits.  History of falls.  Memory and ability to understand (cognition).  Work and work Statistician.  Pregnancy and menstrual history. What immunizations do I need?  Influenza (flu) vaccine  This is recommended every year. Tetanus, diphtheria, and pertussis (Tdap) vaccine  You may need a Td booster every 10 years. Varicella (chickenpox) vaccine  You may need this vaccine if you have not already been vaccinated. Zoster (shingles) vaccine  You may need this after age 18. Pneumococcal conjugate (PCV13) vaccine  One dose is recommended after age 53. Pneumococcal polysaccharide (PPSV23) vaccine  One dose is recommended after age 28. Measles, mumps, and rubella (MMR) vaccine  You may need at least one dose of MMR if you were born in 1957 or later. You  may also need a second dose. Meningococcal conjugate (MenACWY) vaccine  You may need this if you have certain conditions. Hepatitis A vaccine  You may need this if you have certain conditions or if you travel or work in places where you may be exposed to hepatitis A. Hepatitis B vaccine  You may need this if you have certain conditions or if you travel or work in places where you may be exposed to hepatitis B. Haemophilus influenzae type b (Hib) vaccine  You may need this if you have certain conditions. You may receive vaccines as individual doses or as more than one vaccine together in one shot (combination vaccines). Talk with your health care provider about the risks and benefits of combination vaccines. What tests do I need? Blood tests  Lipid and cholesterol levels. These may be checked  every 5 years, or more frequently depending on your overall health.  Hepatitis C test.  Hepatitis B test. Screening  Lung cancer screening. You may have this screening every year starting at age 56 if you have a 30-pack-year history of smoking and currently smoke or have quit within the past 15 years.  Colorectal cancer screening. All adults should have this screening starting at age 102 and continuing until age 18. Your health care provider may recommend screening at age 42 if you are at increased risk. You will have tests every 1-10 years, depending on your results and the type of screening test.  Diabetes screening. This is done by checking your blood sugar (glucose) after you have not eaten for a while (fasting). You may have this done every 1-3 years.  Mammogram. This may be done every 1-2 years. Talk with your health care provider about how often you should have regular mammograms.  BRCA-related cancer screening. This may be done if you have a family history of breast, ovarian, tubal, or peritoneal cancers. Other tests  Sexually transmitted disease (STD) testing.  Bone density scan. This  is done to screen for osteoporosis. You may have this done starting at age 53. Follow these instructions at home: Eating and drinking  Eat a diet that includes fresh fruits and vegetables, whole grains, lean protein, and low-fat dairy products. Limit your intake of foods with high amounts of sugar, saturated fats, and salt.  Take vitamin and mineral supplements as recommended by your health care provider.  Do not drink alcohol if your health care provider tells you not to drink.  If you drink alcohol: ? Limit how much you have to 0-1 drink a day. ? Be aware of how much alcohol is in your drink. In the [place] one drink equals one 12 oz bottle of beer (355 mL), one 5 oz glass of wine (148 mL), or one 1 oz glass of hard liquor (44 mL). Lifestyle  Take daily care of your teeth and gums.  Stay active. Exercise for at least 30 minutes on 5 or more days each week.  Do not use any products that contain nicotine or tobacco, such as cigarettes, e-cigarettes, and chewing tobacco. If you need help quitting, ask your health care provider.  If you are sexually active, practice safe sex. Use a condom or other form of protection in order to prevent STIs (sexually transmitted infections).  Talk with your health care provider about taking a low-dose aspirin or statin. What's next?  Go to your health care provider once a year for a well check visit.  Ask your health care provider how often you should have your eyes and teeth checked.  Stay up to date on all vaccines. This information is not intended to replace advice given to you by your health care provider. Make sure you discuss any questions you have with your health care provider. Document Released: 11/18/2015 Document Revised: 10/16/2018 Document Reviewed: 10/16/2018 Elsevier Patient Education  2020 [place], MD [facility]

## 2020-01-15 DIAGNOSIS — Z961 Presence of intraocular lens: Secondary | ICD-10-CM | POA: Diagnosis not present

## 2020-01-15 DIAGNOSIS — H524 Presbyopia: Secondary | ICD-10-CM | POA: Diagnosis not present

## 2020-01-16 ENCOUNTER — Other Ambulatory Visit: Payer: Self-pay | Admitting: Internal Medicine

## 2020-01-16 DIAGNOSIS — E038 Other specified hypothyroidism: Secondary | ICD-10-CM

## 2020-02-18 DIAGNOSIS — Z20822 Contact with and (suspected) exposure to covid-19: Secondary | ICD-10-CM | POA: Diagnosis not present

## 2020-03-04 ENCOUNTER — Other Ambulatory Visit: Payer: Self-pay | Admitting: Internal Medicine

## 2020-03-04 DIAGNOSIS — Z1231 Encounter for screening mammogram for malignant neoplasm of breast: Secondary | ICD-10-CM

## 2020-03-09 ENCOUNTER — Other Ambulatory Visit: Payer: Self-pay

## 2020-03-09 ENCOUNTER — Ambulatory Visit
Admission: RE | Admit: 2020-03-09 | Discharge: 2020-03-09 | Disposition: A | Discharge status: Home / Self Care | Payer: Medicare Other | Source: Ambulatory Visit | Attending: Internal Medicine | Admitting: Internal Medicine

## 2020-03-09 DIAGNOSIS — Z1231 Encounter for screening mammogram for malignant neoplasm of breast: Secondary | ICD-10-CM

## 2020-04-13 ENCOUNTER — Ambulatory Visit (INDEPENDENT_AMBULATORY_CARE_PROVIDER_SITE_OTHER): Payer: Medicare PPO | Admitting: Internal Medicine

## 2020-04-13 ENCOUNTER — Encounter: Payer: Self-pay | Admitting: Internal Medicine

## 2020-04-13 ENCOUNTER — Other Ambulatory Visit: Payer: Self-pay

## 2020-04-13 VITALS — BP 100/58 | HR 78 | Temp 97.6°F | Ht 63.5 in | Wt 206.0 lb

## 2020-04-13 DIAGNOSIS — E7800 Pure hypercholesterolemia, unspecified: Secondary | ICD-10-CM | POA: Diagnosis not present

## 2020-04-13 DIAGNOSIS — E038 Other specified hypothyroidism: Secondary | ICD-10-CM | POA: Diagnosis not present

## 2020-04-13 DIAGNOSIS — I10 Essential (primary) hypertension: Secondary | ICD-10-CM | POA: Diagnosis not present

## 2020-04-13 MED ORDER — HYDROCHLOROTHIAZIDE 25 MG PO TABS: 25.0000 mg | ORAL_TABLET | Freq: Every day | ORAL | 1 refills | Status: DC

## 2020-04-13 MED ORDER — OLMESARTAN MEDOXOMIL 5 MG PO TABS: 10.0000 mg | ORAL_TABLET | Freq: Every day | ORAL | 1 refills | Status: DC

## 2020-04-13 NOTE — Patient Instructions (Signed)
-  Nice seeing you today!!  -Stop Benicar HCT.  -Start HCTZ 12.5 mg daily and Olmesartan 5 mg 2 tablets daily to equal 10 mg.  -See you back in 6 months for your physical. Please come in fasting.

## 2020-04-13 NOTE — Progress Notes (Signed)
Established Patient Office Visit     This visit occurred during the SARS-CoV-2 public health emergency.  Safety protocols were in place, including screening questions prior to the visit, additional usage of staff PPE, and extensive cleaning of exam room while observing appropriate contact time as indicated for disinfecting solutions.    CC/Reason for Visit: 21-month follow-up chronic conditions  HPI: Karina Allen is a 69 y.o. female who is coming in today for the above mentioned reasons. Past Medical History is significant for: Hypertension, hypothyroidism, hyperlipidemia medications and morbid obesity.  Since her last visit in December she has been able to lose 20 pounds through diet and exercise.  She states her blood pressure has been low at home with a range of 425-956 systolic with 38-75 diastolic.  She sometimes gets dizzy at times.  Other than that she has been doing well and has no acute complaints.   Past Medical/Surgical History: Past Medical History:  Diagnosis Date  . Arthritis    osteo  . Depression   . Epiretinal membrane 02/05/2017   Sees Dr. Valetta Close and Dr. Iona Hansen  . Essential hypertension 04/27/2014  . FH: migraines   . GAD (generalized anxiety disorder) 08/04/2015  . Hepatitis B    reports dx and tx as a young child, reports no issues since and retested as adult and told did not have hepatitis, she is unsure of type of hep she had  . Thyroid disease     Past Surgical History:  Procedure Laterality Date  . APPENDECTOMY    . CATARACT EXTRACTION    . CHOLECYSTECTOMY    . ELBOW SURGERY     shattered elbow  . endometrial oblation    . FOOT SURGERY    . RETINAL DETACHMENT SURGERY    . TONSILLECTOMY AND ADENOIDECTOMY    . TUBAL LIGATION      Social History:  reports that she has never smoked. She has never used smokeless tobacco. She reports current alcohol use. She reports that she does not use drugs.  Allergies: Allergies  Allergen Reactions  .  Penicillins     Family History:  Family History  Problem Relation Age of Onset  . Prostate cancer Brother        29     Current Outpatient Medications:  .  SYNTHROID 150 MCG tablet, TAKE ONE TABLET BY MOUTH EVERY MORNING WITH BREAKFAST, Disp: 90 tablet, Rfl: 1 .  hydrochlorothiazide (HYDRODIURIL) 25 MG tablet, Take 1 tablet (25 mg total) by mouth daily., Disp: 90 tablet, Rfl: 1 .  olmesartan (BENICAR) 5 MG tablet, Take 2 tablets (10 mg total) by mouth daily., Disp: 180 tablet, Rfl: 1  Review of Systems:  Constitutional: Denies fever, chills, diaphoresis, appetite change and fatigue.  HEENT: Denies photophobia, eye pain, redness, hearing loss, ear pain, congestion, sore throat, rhinorrhea, sneezing, mouth sores, trouble swallowing, neck pain, neck stiffness and tinnitus.   Respiratory: Denies SOB, DOE, cough, chest tightness,  and wheezing.   Cardiovascular: Denies chest pain, palpitations and leg swelling.  Gastrointestinal: Denies nausea, vomiting, abdominal pain, diarrhea, constipation, blood in stool and abdominal distention.  Genitourinary: Denies dysuria, urgency, frequency, hematuria, flank pain and difficulty urinating.  Endocrine: Denies: hot or cold intolerance, sweats, changes in hair or nails, polyuria, polydipsia. Musculoskeletal: Denies myalgias, back pain, joint swelling, arthralgias and gait problem.  Skin: Denies pallor, rash and wound.  Neurological: Denies dizziness, seizures, syncope, weakness, light-headedness, numbness and headaches.  Hematological: Denies adenopathy. Easy bruising, personal or family  bleeding history  Psychiatric/Behavioral: Denies suicidal ideation, mood changes, confusion, nervousness, sleep disturbance and agitation    Physical Exam: Vitals:   04/13/20 0703  BP: (!) 100/58  Pulse: 78  Temp: 97.6 F (36.4 C)  TempSrc: Other (Comment)  SpO2: 96%  Weight: 206 lb (93.4 kg)  Height: 5' 3.5" (1.613 m)    Body mass index is 35.92  kg/m.   Constitutional: NAD, calm, comfortable Eyes: PERRL, lids and conjunctivae normal ENMT: Mucous membranes are moist. Respiratory: clear to auscultation bilaterally, no wheezing, no crackles. Normal respiratory effort. No accessory muscle use.  Cardiovascular: Regular rate and rhythm, no murmurs / rubs / gallops. No extremity edema.  Neurologic: Grossly intact and nonfocal Psychiatric: Normal judgment and insight. Alert and oriented x 3. Normal mood.    Impression and Plan:  Essential hypertension -She relates that with her significant weight loss she may be on too much blood pressure medication, blood pressure is certainly in the lower side of normal. -She is currently on Benicar HCT 20/12.5 mg. -We will have her current lovastatin dose to 10 mg and continue HCTZ 12.5 mg. -She will do ambulatory blood pressure monitoring and notify us if any significant issues.  Pure hypercholesterolemia -Last LDL was 118.  Other specified hypothyroidism -Last TSH was 2.39, continue Synthroid  Morbid obesity ([place]) -Discussed healthy lifestyle, including increased physical activity and better food choices to promote weight loss. -She has been congratulated on her weight loss thus far.    Patient Instructions  -Nice seeing you today!!  -Stop Benicar HCT.  -Start HCTZ 12.5 mg daily and Olmesartan 5 mg 2 tablets daily to equal 10 mg.  -See you back in 6 months for your physical. Please come in fasting.     Lelon Frohlich, MD [facility]

## 2020-07-12 ENCOUNTER — Other Ambulatory Visit: Payer: Self-pay | Admitting: Internal Medicine

## 2020-07-12 DIAGNOSIS — E038 Other specified hypothyroidism: Secondary | ICD-10-CM

## 2020-10-19 ENCOUNTER — Ambulatory Visit (INDEPENDENT_AMBULATORY_CARE_PROVIDER_SITE_OTHER): Payer: Medicare PPO | Admitting: Internal Medicine

## 2020-10-19 ENCOUNTER — Encounter: Payer: Self-pay | Admitting: Internal Medicine

## 2020-10-19 ENCOUNTER — Other Ambulatory Visit: Payer: Self-pay

## 2020-10-19 VITALS — BP 124/80 | HR 65 | Temp 97.5°F | Ht 64.0 in | Wt 205.3 lb

## 2020-10-19 DIAGNOSIS — Z0000 Encounter for general adult medical examination without abnormal findings: Secondary | ICD-10-CM

## 2020-10-19 DIAGNOSIS — R7302 Impaired glucose tolerance (oral): Secondary | ICD-10-CM

## 2020-10-19 DIAGNOSIS — E7800 Pure hypercholesterolemia, unspecified: Secondary | ICD-10-CM

## 2020-10-19 DIAGNOSIS — I10 Essential (primary) hypertension: Secondary | ICD-10-CM | POA: Diagnosis not present

## 2020-10-19 DIAGNOSIS — E038 Other specified hypothyroidism: Secondary | ICD-10-CM | POA: Diagnosis not present

## 2020-10-19 DIAGNOSIS — Z1211 Encounter for screening for malignant neoplasm of colon: Secondary | ICD-10-CM

## 2020-10-19 LAB — COMPREHENSIVE METABOLIC PANEL
ALT: 17 U/L (ref 0–35)
AST: 18 U/L (ref 0–37)
Albumin: 4.1 g/dL (ref 3.5–5.2)
Alkaline Phosphatase: 81 U/L (ref 39–117)
BUN: 14 mg/dL (ref 6–23)
CO2: 29 mEq/L (ref 19–32)
Calcium: 9.4 mg/dL (ref 8.4–10.5)
Chloride: 102 mEq/L (ref 96–112)
Creatinine, Ser: 0.88 mg/dL (ref 0.40–1.20)
GFR: 67.47 mL/min (ref >60.00)
Glucose, Bld: 100 mg/dL — ABNORMAL HIGH (ref 70–99)
Potassium: 3.7 mEq/L (ref 3.5–5.1)
Sodium: 138 mEq/L (ref 135–145)
Total Bilirubin: 0.7 mg/dL (ref 0.2–1.2)
Total Protein: 6.8 g/dL (ref 6.0–8.3)

## 2020-10-19 LAB — CBC WITH DIFFERENTIAL/PLATELET
Basophils Absolute: 0.1 10*3/uL (ref 0.0–0.1)
Basophils Relative: 1.1 % (ref 0.0–3.0)
Eosinophils Absolute: 0.1 10*3/uL (ref 0.0–0.7)
Eosinophils Relative: 2.1 % (ref 0.0–5.0)
HCT: 37.6 % (ref 36.0–46.0)
Hemoglobin: 12.8 g/dL (ref 12.0–15.0)
Lymphocytes Relative: 29.3 % (ref 12.0–46.0)
Lymphs Abs: 2 10*3/uL (ref 0.7–4.0)
MCHC: 33.9 g/dL (ref 30.0–36.0)
MCV: 83.5 fl (ref 78.0–100.0)
Monocytes Absolute: 0.4 10*3/uL (ref 0.1–1.0)
Monocytes Relative: 5.6 % (ref 3.0–12.0)
Neutro Abs: 4.1 10*3/uL (ref 1.4–7.7)
Neutrophils Relative %: 61.9 % (ref 43.0–77.0)
Platelets: 263 10*3/uL (ref 150.0–400.0)
RBC: 4.51 Mil/uL (ref 3.87–5.11)
RDW: 14.1 % (ref 11.5–15.5)
WBC: 6.7 10*3/uL (ref 4.0–10.5)

## 2020-10-19 LAB — VITAMIN B12: Vitamin B-12: 480 pg/mL (ref 211–911)

## 2020-10-19 LAB — LIPID PANEL
Cholesterol: 191 mg/dL (ref 0–200)
HDL: 46 mg/dL (ref >39.00)
LDL Cholesterol: 124 mg/dL — ABNORMAL HIGH (ref 0–99)
NonHDL: 144.77
Total CHOL/HDL Ratio: 4
Triglycerides: 103 mg/dL (ref 0.0–149.0)
VLDL: 20.6 mg/dL (ref 0.0–40.0)

## 2020-10-19 LAB — HEMOGLOBIN A1C: Hgb A1c MFr Bld: 5.6 % (ref 4.6–6.5)

## 2020-10-19 LAB — VITAMIN D 25 HYDROXY (VIT D DEFICIENCY, FRACTURES): VITD: 48.46 ng/mL (ref 30.00–100.00)

## 2020-10-19 LAB — TSH: TSH: 4.89 u[IU]/mL — ABNORMAL HIGH (ref 0.35–4.50)

## 2020-10-19 NOTE — Patient Instructions (Signed)
-Nice seeing you today!!  -Lab work today; will notify you once results are available.  -Schedule follow up in 6 months.   Preventive Care 68 Years and Older, Female Preventive care refers to lifestyle choices and visits with your health care provider that can promote health and wellness. This includes:  A yearly physical exam. This is also called an annual well check.  Regular dental and eye exams.  Immunizations.  Screening for certain conditions.  Healthy lifestyle choices, such as diet and exercise. What can I expect for my preventive care visit? Physical exam Your health care provider will check:  Height and weight. These may be used to calculate body mass index (BMI), which is a measurement that tells if you are at a healthy weight.  Heart rate and blood pressure.  Your skin for abnormal spots. Counseling Your health care provider may ask you questions about:  Alcohol, tobacco, and drug use.  Emotional well-being.  Home and relationship well-being.  Sexual activity.  Eating habits.  History of falls.  Memory and ability to understand (cognition).  Work and work environment.  Pregnancy and menstrual history. What immunizations do I need?  Influenza (flu) vaccine  This is recommended every year. Tetanus, diphtheria, and pertussis (Tdap) vaccine  You may need a Td booster every 10 years. Varicella (chickenpox) vaccine  You may need this vaccine if you have not already been vaccinated. Zoster (shingles) vaccine  You may need this after age 60. Pneumococcal conjugate (PCV13) vaccine  One dose is recommended after age 68. Pneumococcal polysaccharide (PPSV23) vaccine  One dose is recommended after age 68. Measles, mumps, and rubella (MMR) vaccine  You may need at least one dose of MMR if you were born in 1957 or later. You may also need a second dose. Meningococcal conjugate (MenACWY) vaccine  You may need this if you have certain  conditions. Hepatitis A vaccine  You may need this if you have certain conditions or if you travel or work in places where you may be exposed to hepatitis A. Hepatitis B vaccine  You may need this if you have certain conditions or if you travel or work in places where you may be exposed to hepatitis B. Haemophilus influenzae type b (Hib) vaccine  You may need this if you have certain conditions. You may receive vaccines as individual doses or as more than one vaccine together in one shot (combination vaccines). Talk with your health care provider about the risks and benefits of combination vaccines. What tests do I need? Blood tests  Lipid and cholesterol levels. These may be checked every 5 years, or more frequently depending on your overall health.  Hepatitis C test.  Hepatitis B test. Screening  Lung cancer screening. You may have this screening every year starting at age 55 if you have a 30-pack-year history of smoking and currently smoke or have quit within the past 15 years.  Colorectal cancer screening. All adults should have this screening starting at age 50 and continuing until age 75. Your health care provider may recommend screening at age 45 if you are at increased risk. You will have tests every 1-10 years, depending on your results and the type of screening test.  Diabetes screening. This is done by checking your blood sugar (glucose) after you have not eaten for a while (fasting). You may have this done every 1-3 years.  Mammogram. This may be done every 1-2 years. Talk with your health care provider about how often you should   have regular mammograms.  BRCA-related cancer screening. This may be done if you have a family history of breast, ovarian, tubal, or peritoneal cancers. Other tests  Sexually transmitted disease (STD) testing.  Bone density scan. This is done to screen for osteoporosis. You may have this done starting at age 68. Follow these instructions at  home: Eating and drinking  Eat a diet that includes fresh fruits and vegetables, whole grains, lean protein, and low-fat dairy products. Limit your intake of foods with high amounts of sugar, saturated fats, and salt.  Take vitamin and mineral supplements as recommended by your health care provider.  Do not drink alcohol if your health care provider tells you not to drink.  If you drink alcohol: ? Limit how much you have to 0-1 drink a day. ? Be aware of how much alcohol is in your drink. In the [place] one drink equals one 12 oz bottle of beer (355 mL), one 5 oz glass of wine (148 mL), or one 1 oz glass of hard liquor (44 mL). Lifestyle  Take daily care of your teeth and gums.  Stay active. Exercise for at least 30 minutes on 5 or more days each week.  Do not use any products that contain nicotine or tobacco, such as cigarettes, e-cigarettes, and chewing tobacco. If you need help quitting, ask your health care provider.  If you are sexually active, practice safe sex. Use a condom or other form of protection in order to prevent STIs (sexually transmitted infections).  Talk with your health care provider about taking a low-dose aspirin or statin. What's next?  Go to your health care provider once a year for a well check visit.  Ask your health care provider how often you should have your eyes and teeth checked.  Stay up to date on all vaccines. This information is not intended to replace advice given to you by your health care provider. Make sure you discuss any questions you have with your health care provider. Document Revised: 10/16/2018 Document Reviewed: 10/16/2018 Elsevier Patient Education  2020 [employer]vier [employer]  

## 2020-10-19 NOTE — Progress Notes (Signed)
Established Patient Office Visit     This visit occurred during the SARS-CoV-2 public health emergency.  Safety protocols were in place, including screening questions prior to the visit, additional usage of staff PPE, and extensive cleaning of exam room while observing appropriate contact time as indicated for disinfecting solutions.    CC/Reason for Visit: Annual preventive exam and subsequent Medicare wellness visit  HPI: Karina Allen is a 69 y.o. female who is coming in today for the above mentioned reasons. Past Medical History is significant for: Hypertension, hyperlipidemia, hypothyroidism, morbid obesity, impaired glucose tolerance.  She has no acute issues today.  She has routine eye and dental care, no perceived hearing issues, she exercises daily.  She is overdue for Pap smear and colonoscopy.  She had a mammogram in May.  She is overdue as well for bone density.  She is due for Covid, flu, pneumonia, shingles vaccinations.   Past Medical/Surgical History: Past Medical History:  Diagnosis Date  . Arthritis    osteo  . Depression   . Epiretinal membrane 02/05/2017   Sees Dr. Valetta Close and Dr. Iona Hansen  . Essential hypertension 04/27/2014  . FH: migraines   . GAD (generalized anxiety disorder) 08/04/2015  . Hepatitis B    reports dx and tx as a young child, reports no issues since and retested as adult and told did not have hepatitis, she is unsure of type of hep she had  . Thyroid disease     Past Surgical History:  Procedure Laterality Date  . APPENDECTOMY    . CATARACT EXTRACTION    . CHOLECYSTECTOMY    . ELBOW SURGERY     shattered elbow  . endometrial oblation    . FOOT SURGERY    . RETINAL DETACHMENT SURGERY    . TONSILLECTOMY AND ADENOIDECTOMY    . TUBAL LIGATION      Social History:  reports that she has never smoked. She has never used smokeless tobacco. She reports current alcohol use. She reports that she does not use drugs.  Allergies: Allergies   Allergen Reactions  . Penicillins   . Penicillamine Hives    Family History:  Family History  Problem Relation Age of Onset  . Prostate cancer Brother        30     Current Outpatient Medications:  .  hydrochlorothiazide (HYDRODIURIL) 25 MG tablet, Take 1 tablet (25 mg total) by mouth daily., Disp: 90 tablet, Rfl: 1 .  olmesartan (BENICAR) 5 MG tablet, Take 2 tablets (10 mg total) by mouth daily., Disp: 180 tablet, Rfl: 1 .  SYNTHROID 150 MCG tablet, TAKE ONE TABLET BY MOUTH EVERY MORNING WITH BREAKFAST, Disp: 90 tablet, Rfl: 1  Review of Systems:  Constitutional: Denies fever, chills, diaphoresis, appetite change and fatigue.  HEENT: Denies photophobia, eye pain, redness, hearing loss, ear pain, congestion, sore throat, rhinorrhea, sneezing, mouth sores, trouble swallowing, neck pain, neck stiffness and tinnitus.   Respiratory: Denies SOB, DOE, cough, chest tightness,  and wheezing.   Cardiovascular: Denies chest pain, palpitations and leg swelling.  Gastrointestinal: Denies nausea, vomiting, abdominal pain, diarrhea, constipation, blood in stool and abdominal distention.  Genitourinary: Denies dysuria, urgency, frequency, hematuria, flank pain and difficulty urinating.  Endocrine: Denies: hot or cold intolerance, sweats, changes in hair or nails, polyuria, polydipsia. Musculoskeletal: Denies myalgias, back pain, joint swelling, arthralgias and gait problem.  Skin: Denies pallor, rash and wound.  Neurological: Denies dizziness, seizures, syncope, weakness, light-headedness, numbness and headaches.  Hematological: Denies  adenopathy. Easy bruising, personal or family bleeding history  Psychiatric/Behavioral: Denies suicidal ideation, mood changes, confusion, nervousness, sleep disturbance and agitation    Physical Exam: Vitals:   10/19/20 0707  BP: 124/80  Pulse: 65  Temp: (!) 97.5 F (36.4 C)  TempSrc: Oral  SpO2: 99%  Weight: 205 lb 4.8 oz (93.1 kg)  Height: '5\' 4"'   (1.626 m)    Body mass index is 35.24 kg/m.   Constitutional: NAD, calm, comfortable Eyes: PERRL, lids and conjunctivae normal, wears corrective lenses ENMT: Mucous membranes are moist. Posterior pharynx clear of any exudate or lesions. Normal dentition. Tympanic membrane is pearly white, no erythema or bulging. Neck: normal, supple, no masses, no thyromegaly Respiratory: clear to auscultation bilaterally, no wheezing, no crackles. Normal respiratory effort. No accessory muscle use.  Cardiovascular: Regular rate and rhythm, no murmurs / rubs / gallops. No extremity edema. 2+ pedal pulses. Abdomen: no tenderness, no masses palpated. No hepatosplenomegaly. Bowel sounds positive.  Musculoskeletal: no clubbing / cyanosis. No joint deformity upper and lower extremities. Good ROM, no contractures. Normal muscle tone.  Skin: no rashes, lesions, ulcers. No induration Neurologic: CN 2-12 grossly intact. Sensation intact, DTR normal. Strength 5/5 in all 4.  Psychiatric: Normal judgment and insight. Alert and oriented x 3. Normal mood.   Subsequent Medicare wellness visit   1. Risk factors, based on past  M,S,F -cardiovascular disease risk factors include age, history of hypertension, history of hyperlipidemia, obesity   2.  Physical activities: She does water aerobics and walks on an almost daily basis   3.  Depression/mood:  Stable, not depressed   4.  Hearing:  No perceived issues   5.  ADL's: Independent in all ADLs   6.  Fall risk:  Low fall risk   7.  Home safety: No problems identified   8.  Height weight, and visual acuity: height and weight as above, vision:   Visual Acuity Screening   Right eye Left eye Both eyes  Without correction: '20/20 20/50 20/20 '  With correction:        9.  Counseling:  We have counseled her on importance of vaccinations, DEXA scan and GI referral for colonoscopy   10. Lab orders based on risk factors: Laboratory update will be reviewed   11.  Referral :  GI for colonoscopy   12. Care plan:  Follow-up with me in 6 months   13. Cognitive assessment:  No cognitive impairment   14. Screening: Patient provided with a written and personalized 5-10 year screening schedule in the AVS.   yes   15. Provider List Update:   PCP only  16. Advance Directives: Full code   [place] Office Visit from 10/19/2020 in [place] at [place]  PHQ-9 Total Score 0      Fall Risk  10/19/2020 10/15/2019 10/10/2017  Falls in the past year? 0 1 No  Number falls in past yr: 0 1 -  Injury with Fall? 0 0 -     Impression and Plan:  Encounter for preventive health examination  -She has routine eye and dental care. -She declines all vaccinations today despite counseling. -Screening labs today. -Healthy lifestyle discussed in detail. -She will be referred to GI for screening colonoscopy. -She declines cervical cancer screening. -She had a mammogram in May of this year that was normal. -She declines DEXA scan which she is due for.  IGT (impaired glucose tolerance)  - Plan: Hemoglobin A1c  Morbid obesity ([place]) -Discussed healthy lifestyle, including  increased physical activity and better food choices to promote weight loss. -She has been congratulated on weight loss thus far.  Other specified hypothyroidism  - Plan: TSH -For now continue current levothyroxine dose which is 150 mcg daily.  Essential hypertension -Well-controlled on hydrochlorothiazide 25 mg olmesartan 10 mg daily.  Pure hypercholesterolemia  - Plan: Lipid panel -Not currently on medications.   Patient Instructions  -Nice seeing you today!!  -Lab work today; will notify you once results are available.  -Schedule follow up in 6 months.   Preventive Care 25 Years and Older, Female Preventive care refers to lifestyle choices and visits with your health care provider that can promote health and wellness. This includes:  A yearly physical exam. This  is also called an annual well check.  Regular dental and eye exams.  Immunizations.  Screening for certain conditions.  Healthy lifestyle choices, such as diet and exercise. What can I expect for my preventive care visit? Physical exam Your health care provider will check:  Height and weight. These may be used to calculate body mass index (BMI), which is a measurement that tells if you are at a healthy weight.  Heart rate and blood pressure.  Your skin for abnormal spots. Counseling Your health care provider may ask you questions about:  Alcohol, tobacco, and drug use.  Emotional well-being.  Home and relationship well-being.  Sexual activity.  Eating habits.  History of falls.  Memory and ability to understand (cognition).  Work and work Statistician.  Pregnancy and menstrual history. What immunizations do I need?  Influenza (flu) vaccine  This is recommended every year. Tetanus, diphtheria, and pertussis (Tdap) vaccine  You may need a Td booster every 10 years. Varicella (chickenpox) vaccine  You may need this vaccine if you have not already been vaccinated. Zoster (shingles) vaccine  You may need this after age 33. Pneumococcal conjugate (PCV13) vaccine  One dose is recommended after age 19. Pneumococcal polysaccharide (PPSV23) vaccine  One dose is recommended after age 24. Measles, mumps, and rubella (MMR) vaccine  You may need at least one dose of MMR if you were born in 1957 or later. You may also need a second dose. Meningococcal conjugate (MenACWY) vaccine  You may need this if you have certain conditions. Hepatitis A vaccine  You may need this if you have certain conditions or if you travel or work in places where you may be exposed to hepatitis A. Hepatitis B vaccine  You may need this if you have certain conditions or if you travel or work in places where you may be exposed to hepatitis B. Haemophilus influenzae type b (Hib)  vaccine  You may need this if you have certain conditions. You may receive vaccines as individual doses or as more than one vaccine together in one shot (combination vaccines). Talk with your health care provider about the risks and benefits of combination vaccines. What tests do I need? Blood tests  Lipid and cholesterol levels. These may be checked every 5 years, or more frequently depending on your overall health.  Hepatitis C test.  Hepatitis B test. Screening  Lung cancer screening. You may have this screening every year starting at age 53 if you have a 30-pack-year history of smoking and currently smoke or have quit within the past 15 years.  Colorectal cancer screening. All adults should have this screening starting at age 33 and continuing until age 56. Your health care provider may recommend screening at age 71 if you are  at increased risk. You will have tests every 1-10 years, depending on your results and the type of screening test.  Diabetes screening. This is done by checking your blood sugar (glucose) after you have not eaten for a while (fasting). You may have this done every 1-3 years.  Mammogram. This may be done every 1-2 years. Talk with your health care provider about how often you should have regular mammograms.  BRCA-related cancer screening. This may be done if you have a family history of breast, ovarian, tubal, or peritoneal cancers. Other tests  Sexually transmitted disease (STD) testing.  Bone density scan. This is done to screen for osteoporosis. You may have this done starting at age 70. Follow these instructions at home: Eating and drinking  Eat a diet that includes fresh fruits and vegetables, whole grains, lean protein, and low-fat dairy products. Limit your intake of foods with high amounts of sugar, saturated fats, and salt.  Take vitamin and mineral supplements as recommended by your health care provider.  Do not drink alcohol if your health care  provider tells you not to drink.  If you drink alcohol: ? Limit how much you have to 0-1 drink a day. ? Be aware of how much alcohol is in your drink. In the [place] one drink equals one 12 oz bottle of beer (355 mL), one 5 oz glass of wine (148 mL), or one 1 oz glass of hard liquor (44 mL). Lifestyle  Take daily care of your teeth and gums.  Stay active. Exercise for at least 30 minutes on 5 or more days each week.  Do not use any products that contain nicotine or tobacco, such as cigarettes, e-cigarettes, and chewing tobacco. If you need help quitting, ask your health care provider.  If you are sexually active, practice safe sex. Use a condom or other form of protection in order to prevent STIs (sexually transmitted infections).  Talk with your health care provider about taking a low-dose aspirin or statin. What's next?  Go to your health care provider once a year for a well check visit.  Ask your health care provider how often you should have your eyes and teeth checked.  Stay up to date on all vaccines. This information is not intended to replace advice given to you by your health care provider. Make sure you discuss any questions you have with your health care provider. Document Revised: 10/16/2018 Document Reviewed: 10/16/2018 Elsevier Patient Education  2020 [place], MD [facility]

## 2020-10-20 ENCOUNTER — Other Ambulatory Visit: Payer: Self-pay | Admitting: Internal Medicine

## 2020-10-20 DIAGNOSIS — E038 Other specified hypothyroidism: Secondary | ICD-10-CM

## 2020-10-20 MED ORDER — LEVOTHYROXINE SODIUM 175 MCG PO TABS: 175.0000 ug | ORAL_TABLET | Freq: Every day | ORAL | 1 refills | Status: DC

## 2020-10-21 ENCOUNTER — Other Ambulatory Visit: Payer: Self-pay | Admitting: Internal Medicine

## 2020-10-21 DIAGNOSIS — I10 Essential (primary) hypertension: Secondary | ICD-10-CM

## 2020-10-21 DIAGNOSIS — E038 Other specified hypothyroidism: Secondary | ICD-10-CM

## 2020-11-22 NOTE — Addendum Note (Signed)
Addended by: Marrion Coy on: 11/22/2020 01:14 PM   Modules accepted: Orders

## 2020-11-30 ENCOUNTER — Other Ambulatory Visit: Payer: Self-pay

## 2020-11-30 ENCOUNTER — Other Ambulatory Visit (INDEPENDENT_AMBULATORY_CARE_PROVIDER_SITE_OTHER): Payer: Medicare PPO

## 2020-11-30 DIAGNOSIS — E038 Other specified hypothyroidism: Secondary | ICD-10-CM

## 2020-11-30 LAB — TSH: TSH: 3.65 u[IU]/mL (ref 0.35–4.50)

## 2021-01-20 DIAGNOSIS — H35372 Puckering of macula, left eye: Secondary | ICD-10-CM | POA: Diagnosis not present

## 2021-01-20 DIAGNOSIS — Z961 Presence of intraocular lens: Secondary | ICD-10-CM | POA: Diagnosis not present

## 2021-01-20 DIAGNOSIS — H524 Presbyopia: Secondary | ICD-10-CM | POA: Diagnosis not present

## 2021-04-03 DIAGNOSIS — L259 Unspecified contact dermatitis, unspecified cause: Secondary | ICD-10-CM | POA: Diagnosis not present

## 2021-04-06 ENCOUNTER — Other Ambulatory Visit: Payer: Self-pay | Admitting: Internal Medicine

## 2021-04-06 DIAGNOSIS — I10 Essential (primary) hypertension: Secondary | ICD-10-CM

## 2021-04-19 ENCOUNTER — Ambulatory Visit: Payer: Medicare PPO | Admitting: Internal Medicine

## 2021-04-21 DIAGNOSIS — M79641 Pain in right hand: Secondary | ICD-10-CM | POA: Diagnosis not present

## 2021-04-21 DIAGNOSIS — S4991XA Unspecified injury of right shoulder and upper arm, initial encounter: Secondary | ICD-10-CM | POA: Diagnosis not present

## 2021-04-21 DIAGNOSIS — S82001A Unspecified fracture of right patella, initial encounter for closed fracture: Secondary | ICD-10-CM | POA: Diagnosis not present

## 2021-04-21 DIAGNOSIS — S60221A Contusion of right hand, initial encounter: Secondary | ICD-10-CM | POA: Diagnosis not present

## 2021-04-21 DIAGNOSIS — G8911 Acute pain due to trauma: Secondary | ICD-10-CM | POA: Diagnosis not present

## 2021-04-21 DIAGNOSIS — S01511A Laceration without foreign body of lip, initial encounter: Secondary | ICD-10-CM | POA: Diagnosis not present

## 2021-04-21 DIAGNOSIS — M25512 Pain in left shoulder: Secondary | ICD-10-CM | POA: Diagnosis not present

## 2021-04-21 DIAGNOSIS — M25561 Pain in right knee: Secondary | ICD-10-CM | POA: Diagnosis not present

## 2021-04-21 DIAGNOSIS — M25461 Effusion, right knee: Secondary | ICD-10-CM | POA: Diagnosis not present

## 2021-04-21 DIAGNOSIS — S8991XA Unspecified injury of right lower leg, initial encounter: Secondary | ICD-10-CM | POA: Diagnosis not present

## 2021-04-21 DIAGNOSIS — S42292A Other displaced fracture of upper end of left humerus, initial encounter for closed fracture: Secondary | ICD-10-CM | POA: Diagnosis not present

## 2021-04-21 DIAGNOSIS — W010XXA Fall on same level from slipping, tripping and stumbling without subsequent striking against object, initial encounter: Secondary | ICD-10-CM | POA: Diagnosis not present

## 2021-04-21 DIAGNOSIS — S42212A Unspecified displaced fracture of surgical neck of left humerus, initial encounter for closed fracture: Secondary | ICD-10-CM | POA: Diagnosis not present

## 2021-04-23 ENCOUNTER — Other Ambulatory Visit: Payer: Self-pay | Admitting: Internal Medicine

## 2021-04-23 DIAGNOSIS — E038 Other specified hypothyroidism: Secondary | ICD-10-CM

## 2021-04-23 DIAGNOSIS — I10 Essential (primary) hypertension: Secondary | ICD-10-CM

## 2021-04-24 ENCOUNTER — Other Ambulatory Visit: Payer: Self-pay | Admitting: Orthopedic Surgery

## 2021-04-24 DIAGNOSIS — M1711 Unilateral primary osteoarthritis, right knee: Secondary | ICD-10-CM | POA: Diagnosis not present

## 2021-04-24 DIAGNOSIS — M25512 Pain in left shoulder: Secondary | ICD-10-CM | POA: Diagnosis not present

## 2021-04-24 DIAGNOSIS — M25561 Pain in right knee: Secondary | ICD-10-CM | POA: Diagnosis not present

## 2021-04-29 ENCOUNTER — Other Ambulatory Visit: Payer: Self-pay

## 2021-04-29 ENCOUNTER — Ambulatory Visit
Admission: RE | Admit: 2021-04-29 | Discharge: 2021-04-29 | Disposition: A | Discharge status: Home / Self Care | Payer: Medicare PPO | Source: Ambulatory Visit | Attending: Orthopedic Surgery | Admitting: Orthopedic Surgery

## 2021-04-29 DIAGNOSIS — S42212A Unspecified displaced fracture of surgical neck of left humerus, initial encounter for closed fracture: Secondary | ICD-10-CM | POA: Diagnosis not present

## 2021-04-29 DIAGNOSIS — M25512 Pain in left shoulder: Secondary | ICD-10-CM

## 2021-05-01 DIAGNOSIS — M1711 Unilateral primary osteoarthritis, right knee: Secondary | ICD-10-CM | POA: Diagnosis not present

## 2021-05-15 DIAGNOSIS — M25512 Pain in left shoulder: Secondary | ICD-10-CM | POA: Diagnosis not present

## 2021-05-15 DIAGNOSIS — M1711 Unilateral primary osteoarthritis, right knee: Secondary | ICD-10-CM | POA: Diagnosis not present

## 2021-05-29 ENCOUNTER — Encounter: Payer: Self-pay | Admitting: Internal Medicine

## 2021-05-31 ENCOUNTER — Ambulatory Visit: Payer: Medicare PPO | Admitting: Internal Medicine

## 2021-06-05 DIAGNOSIS — M25512 Pain in left shoulder: Secondary | ICD-10-CM | POA: Diagnosis not present

## 2021-06-05 DIAGNOSIS — M1711 Unilateral primary osteoarthritis, right knee: Secondary | ICD-10-CM | POA: Diagnosis not present

## 2021-06-09 DIAGNOSIS — M25661 Stiffness of right knee, not elsewhere classified: Secondary | ICD-10-CM | POA: Diagnosis not present

## 2021-06-09 DIAGNOSIS — R269 Unspecified abnormalities of gait and mobility: Secondary | ICD-10-CM | POA: Diagnosis not present

## 2021-06-09 DIAGNOSIS — M25612 Stiffness of left shoulder, not elsewhere classified: Secondary | ICD-10-CM | POA: Diagnosis not present

## 2021-06-09 DIAGNOSIS — M6281 Muscle weakness (generalized): Secondary | ICD-10-CM | POA: Diagnosis not present

## 2021-06-12 DIAGNOSIS — M6281 Muscle weakness (generalized): Secondary | ICD-10-CM | POA: Diagnosis not present

## 2021-06-12 DIAGNOSIS — M25612 Stiffness of left shoulder, not elsewhere classified: Secondary | ICD-10-CM | POA: Diagnosis not present

## 2021-06-12 DIAGNOSIS — R269 Unspecified abnormalities of gait and mobility: Secondary | ICD-10-CM | POA: Diagnosis not present

## 2021-06-12 DIAGNOSIS — M25661 Stiffness of right knee, not elsewhere classified: Secondary | ICD-10-CM | POA: Diagnosis not present

## 2021-06-14 DIAGNOSIS — M25612 Stiffness of left shoulder, not elsewhere classified: Secondary | ICD-10-CM | POA: Diagnosis not present

## 2021-06-14 DIAGNOSIS — M6281 Muscle weakness (generalized): Secondary | ICD-10-CM | POA: Diagnosis not present

## 2021-06-14 DIAGNOSIS — M25661 Stiffness of right knee, not elsewhere classified: Secondary | ICD-10-CM | POA: Diagnosis not present

## 2021-06-14 DIAGNOSIS — R269 Unspecified abnormalities of gait and mobility: Secondary | ICD-10-CM | POA: Diagnosis not present

## 2021-06-19 DIAGNOSIS — M6281 Muscle weakness (generalized): Secondary | ICD-10-CM | POA: Diagnosis not present

## 2021-06-19 DIAGNOSIS — M25612 Stiffness of left shoulder, not elsewhere classified: Secondary | ICD-10-CM | POA: Diagnosis not present

## 2021-06-19 DIAGNOSIS — M25661 Stiffness of right knee, not elsewhere classified: Secondary | ICD-10-CM | POA: Diagnosis not present

## 2021-06-19 DIAGNOSIS — R269 Unspecified abnormalities of gait and mobility: Secondary | ICD-10-CM | POA: Diagnosis not present

## 2021-06-21 DIAGNOSIS — M6281 Muscle weakness (generalized): Secondary | ICD-10-CM | POA: Diagnosis not present

## 2021-06-21 DIAGNOSIS — M25662 Stiffness of left knee, not elsewhere classified: Secondary | ICD-10-CM | POA: Diagnosis not present

## 2021-06-21 DIAGNOSIS — R269 Unspecified abnormalities of gait and mobility: Secondary | ICD-10-CM | POA: Diagnosis not present

## 2021-06-21 DIAGNOSIS — M25661 Stiffness of right knee, not elsewhere classified: Secondary | ICD-10-CM | POA: Diagnosis not present

## 2021-06-21 DIAGNOSIS — M25612 Stiffness of left shoulder, not elsewhere classified: Secondary | ICD-10-CM | POA: Diagnosis not present

## 2021-06-26 DIAGNOSIS — M25661 Stiffness of right knee, not elsewhere classified: Secondary | ICD-10-CM | POA: Diagnosis not present

## 2021-06-26 DIAGNOSIS — R269 Unspecified abnormalities of gait and mobility: Secondary | ICD-10-CM | POA: Diagnosis not present

## 2021-06-26 DIAGNOSIS — M6281 Muscle weakness (generalized): Secondary | ICD-10-CM | POA: Diagnosis not present

## 2021-06-26 DIAGNOSIS — M25612 Stiffness of left shoulder, not elsewhere classified: Secondary | ICD-10-CM | POA: Diagnosis not present

## 2021-06-26 DIAGNOSIS — M25662 Stiffness of left knee, not elsewhere classified: Secondary | ICD-10-CM | POA: Diagnosis not present

## 2021-06-28 DIAGNOSIS — M25661 Stiffness of right knee, not elsewhere classified: Secondary | ICD-10-CM | POA: Diagnosis not present

## 2021-06-28 DIAGNOSIS — M6281 Muscle weakness (generalized): Secondary | ICD-10-CM | POA: Diagnosis not present

## 2021-06-28 DIAGNOSIS — M25612 Stiffness of left shoulder, not elsewhere classified: Secondary | ICD-10-CM | POA: Diagnosis not present

## 2021-06-28 DIAGNOSIS — R269 Unspecified abnormalities of gait and mobility: Secondary | ICD-10-CM | POA: Diagnosis not present

## 2021-07-03 DIAGNOSIS — M6281 Muscle weakness (generalized): Secondary | ICD-10-CM | POA: Diagnosis not present

## 2021-07-03 DIAGNOSIS — M25612 Stiffness of left shoulder, not elsewhere classified: Secondary | ICD-10-CM | POA: Diagnosis not present

## 2021-07-03 DIAGNOSIS — M25661 Stiffness of right knee, not elsewhere classified: Secondary | ICD-10-CM | POA: Diagnosis not present

## 2021-07-03 DIAGNOSIS — R269 Unspecified abnormalities of gait and mobility: Secondary | ICD-10-CM | POA: Diagnosis not present

## 2021-07-05 DIAGNOSIS — M25561 Pain in right knee: Secondary | ICD-10-CM | POA: Diagnosis not present

## 2021-07-05 DIAGNOSIS — M25512 Pain in left shoulder: Secondary | ICD-10-CM | POA: Diagnosis not present

## 2021-07-06 DIAGNOSIS — M25661 Stiffness of right knee, not elsewhere classified: Secondary | ICD-10-CM | POA: Diagnosis not present

## 2021-07-06 DIAGNOSIS — R269 Unspecified abnormalities of gait and mobility: Secondary | ICD-10-CM | POA: Diagnosis not present

## 2021-07-06 DIAGNOSIS — M6281 Muscle weakness (generalized): Secondary | ICD-10-CM | POA: Diagnosis not present

## 2021-07-06 DIAGNOSIS — M25612 Stiffness of left shoulder, not elsewhere classified: Secondary | ICD-10-CM | POA: Diagnosis not present

## 2021-07-11 ENCOUNTER — Telehealth: Payer: Self-pay | Admitting: Internal Medicine

## 2021-07-11 DIAGNOSIS — I10 Essential (primary) hypertension: Secondary | ICD-10-CM

## 2021-07-11 DIAGNOSIS — E038 Other specified hypothyroidism: Secondary | ICD-10-CM

## 2021-07-11 MED ORDER — OLMESARTAN MEDOXOMIL 5 MG PO TABS: 10.0000 mg | ORAL_TABLET | Freq: Every day | ORAL | 1 refills | Status: DC

## 2021-07-11 MED ORDER — HYDROCHLOROTHIAZIDE 25 MG PO TABS: 25.0000 mg | ORAL_TABLET | Freq: Every day | ORAL | 1 refills | Status: DC

## 2021-07-11 MED ORDER — LEVOTHYROXINE SODIUM 175 MCG PO TABS: 175.0000 ug | ORAL_TABLET | Freq: Every day | ORAL | 1 refills | Status: DC

## 2021-07-11 NOTE — Telephone Encounter (Signed)
Left message on machine for patient to return our call.   Patient Karina Allen should be scheduled after 10/19/21. Will fill medications when appointment is scheduled.

## 2021-07-11 NOTE — Telephone Encounter (Signed)
Appointment made and refills sent.

## 2021-07-11 NOTE — Telephone Encounter (Signed)
Patient called because she needs to get cholesterol checked at the end of September and needs orders put in for it so she can speak to [place] about it. Patient also needs refills on SYNTHROID 175 MCG tablet and  olmesartan (BENICAR) 5 MG tablet and  hydrochlorothiazide (HYDRODIURIL) 25 MG tablet  Please Send to  [id number] - [place], [address] Phone:  [phone]  Fax:  [phone]      Good callback number is [phone]    Patient has not been scheduled for cholesterol, will need to be called to make appointment    Please Advise

## 2021-07-11 NOTE — Telephone Encounter (Signed)
disregard

## 2021-07-12 DIAGNOSIS — M25612 Stiffness of left shoulder, not elsewhere classified: Secondary | ICD-10-CM | POA: Diagnosis not present

## 2021-07-12 DIAGNOSIS — M6281 Muscle weakness (generalized): Secondary | ICD-10-CM | POA: Diagnosis not present

## 2021-07-12 DIAGNOSIS — R269 Unspecified abnormalities of gait and mobility: Secondary | ICD-10-CM | POA: Diagnosis not present

## 2021-07-12 DIAGNOSIS — M25661 Stiffness of right knee, not elsewhere classified: Secondary | ICD-10-CM | POA: Diagnosis not present

## 2021-07-14 DIAGNOSIS — R269 Unspecified abnormalities of gait and mobility: Secondary | ICD-10-CM | POA: Diagnosis not present

## 2021-07-14 DIAGNOSIS — M25612 Stiffness of left shoulder, not elsewhere classified: Secondary | ICD-10-CM | POA: Diagnosis not present

## 2021-07-14 DIAGNOSIS — M6281 Muscle weakness (generalized): Secondary | ICD-10-CM | POA: Diagnosis not present

## 2021-07-14 DIAGNOSIS — M25661 Stiffness of right knee, not elsewhere classified: Secondary | ICD-10-CM | POA: Diagnosis not present

## 2021-07-19 DIAGNOSIS — M6281 Muscle weakness (generalized): Secondary | ICD-10-CM | POA: Diagnosis not present

## 2021-07-19 DIAGNOSIS — M25612 Stiffness of left shoulder, not elsewhere classified: Secondary | ICD-10-CM | POA: Diagnosis not present

## 2021-07-19 DIAGNOSIS — R269 Unspecified abnormalities of gait and mobility: Secondary | ICD-10-CM | POA: Diagnosis not present

## 2021-07-19 DIAGNOSIS — M25661 Stiffness of right knee, not elsewhere classified: Secondary | ICD-10-CM | POA: Diagnosis not present

## 2021-07-21 DIAGNOSIS — M25661 Stiffness of right knee, not elsewhere classified: Secondary | ICD-10-CM | POA: Diagnosis not present

## 2021-07-21 DIAGNOSIS — S82044D Nondisplaced comminuted fracture of right patella, subsequent encounter for closed fracture with routine healing: Secondary | ICD-10-CM | POA: Diagnosis not present

## 2021-07-21 DIAGNOSIS — M25612 Stiffness of left shoulder, not elsewhere classified: Secondary | ICD-10-CM | POA: Diagnosis not present

## 2021-07-21 DIAGNOSIS — S42202D Unspecified fracture of upper end of left humerus, subsequent encounter for fracture with routine healing: Secondary | ICD-10-CM | POA: Diagnosis not present

## 2021-07-26 DIAGNOSIS — M25612 Stiffness of left shoulder, not elsewhere classified: Secondary | ICD-10-CM | POA: Diagnosis not present

## 2021-07-26 DIAGNOSIS — M6281 Muscle weakness (generalized): Secondary | ICD-10-CM | POA: Diagnosis not present

## 2021-07-26 DIAGNOSIS — M25661 Stiffness of right knee, not elsewhere classified: Secondary | ICD-10-CM | POA: Diagnosis not present

## 2021-07-26 DIAGNOSIS — R269 Unspecified abnormalities of gait and mobility: Secondary | ICD-10-CM | POA: Diagnosis not present

## 2021-07-28 DIAGNOSIS — M25612 Stiffness of left shoulder, not elsewhere classified: Secondary | ICD-10-CM | POA: Diagnosis not present

## 2021-07-28 DIAGNOSIS — R269 Unspecified abnormalities of gait and mobility: Secondary | ICD-10-CM | POA: Diagnosis not present

## 2021-07-28 DIAGNOSIS — M6281 Muscle weakness (generalized): Secondary | ICD-10-CM | POA: Diagnosis not present

## 2021-07-28 DIAGNOSIS — M25661 Stiffness of right knee, not elsewhere classified: Secondary | ICD-10-CM | POA: Diagnosis not present

## 2021-07-31 DIAGNOSIS — S42202D Unspecified fracture of upper end of left humerus, subsequent encounter for fracture with routine healing: Secondary | ICD-10-CM | POA: Diagnosis not present

## 2021-08-02 DIAGNOSIS — M25661 Stiffness of right knee, not elsewhere classified: Secondary | ICD-10-CM | POA: Diagnosis not present

## 2021-08-02 DIAGNOSIS — M6281 Muscle weakness (generalized): Secondary | ICD-10-CM | POA: Diagnosis not present

## 2021-08-02 DIAGNOSIS — M25612 Stiffness of left shoulder, not elsewhere classified: Secondary | ICD-10-CM | POA: Diagnosis not present

## 2021-08-02 DIAGNOSIS — R269 Unspecified abnormalities of gait and mobility: Secondary | ICD-10-CM | POA: Diagnosis not present

## 2021-08-04 DIAGNOSIS — M25612 Stiffness of left shoulder, not elsewhere classified: Secondary | ICD-10-CM | POA: Diagnosis not present

## 2021-08-04 DIAGNOSIS — M6281 Muscle weakness (generalized): Secondary | ICD-10-CM | POA: Diagnosis not present

## 2021-08-04 DIAGNOSIS — R269 Unspecified abnormalities of gait and mobility: Secondary | ICD-10-CM | POA: Diagnosis not present

## 2021-08-04 DIAGNOSIS — M25661 Stiffness of right knee, not elsewhere classified: Secondary | ICD-10-CM | POA: Diagnosis not present

## 2021-08-08 ENCOUNTER — Other Ambulatory Visit: Payer: Self-pay | Admitting: Orthopaedic Surgery

## 2021-08-08 DIAGNOSIS — M6281 Muscle weakness (generalized): Secondary | ICD-10-CM | POA: Diagnosis not present

## 2021-08-08 DIAGNOSIS — M25512 Pain in left shoulder: Secondary | ICD-10-CM

## 2021-08-08 DIAGNOSIS — R269 Unspecified abnormalities of gait and mobility: Secondary | ICD-10-CM | POA: Diagnosis not present

## 2021-08-08 DIAGNOSIS — M25612 Stiffness of left shoulder, not elsewhere classified: Secondary | ICD-10-CM | POA: Diagnosis not present

## 2021-08-08 DIAGNOSIS — M25661 Stiffness of right knee, not elsewhere classified: Secondary | ICD-10-CM | POA: Diagnosis not present

## 2021-08-08 DIAGNOSIS — M25522 Pain in left elbow: Secondary | ICD-10-CM | POA: Diagnosis not present

## 2021-08-14 ENCOUNTER — Other Ambulatory Visit: Payer: Self-pay

## 2021-08-15 ENCOUNTER — Ambulatory Visit: Payer: Medicare PPO | Admitting: Internal Medicine

## 2021-08-15 ENCOUNTER — Encounter: Payer: Self-pay | Admitting: Internal Medicine

## 2021-08-15 VITALS — BP 130/80 | HR 86 | Temp 97.9°F | Ht 64.0 in | Wt 203.9 lb

## 2021-08-15 DIAGNOSIS — E038 Other specified hypothyroidism: Secondary | ICD-10-CM | POA: Diagnosis not present

## 2021-08-15 DIAGNOSIS — R7302 Impaired glucose tolerance (oral): Secondary | ICD-10-CM | POA: Diagnosis not present

## 2021-08-15 DIAGNOSIS — Z01818 Encounter for other preprocedural examination: Secondary | ICD-10-CM | POA: Diagnosis not present

## 2021-08-15 DIAGNOSIS — I10 Essential (primary) hypertension: Secondary | ICD-10-CM | POA: Diagnosis not present

## 2021-08-15 LAB — COMPREHENSIVE METABOLIC PANEL
ALT: 14 U/L (ref 0–35)
AST: 20 U/L (ref 0–37)
Albumin: 4.1 g/dL (ref 3.5–5.2)
Alkaline Phosphatase: 77 U/L (ref 39–117)
BUN: 26 mg/dL — ABNORMAL HIGH (ref 6–23)
CO2: 26 mEq/L (ref 19–32)
Calcium: 10.1 mg/dL (ref 8.4–10.5)
Chloride: 102 mEq/L (ref 96–112)
Creatinine, Ser: 0.75 mg/dL (ref 0.40–1.20)
GFR: 81.26 mL/min (ref >60.00)
Glucose, Bld: 102 mg/dL — ABNORMAL HIGH (ref 70–99)
Potassium: 3.7 mEq/L (ref 3.5–5.1)
Sodium: 138 mEq/L (ref 135–145)
Total Bilirubin: 0.6 mg/dL (ref 0.2–1.2)
Total Protein: 6.8 g/dL (ref 6.0–8.3)

## 2021-08-15 LAB — HEMOGLOBIN A1C: Hgb A1c MFr Bld: 5.8 % (ref 4.6–6.5)

## 2021-08-15 LAB — TSH: TSH: 0.51 u[IU]/mL (ref 0.35–5.50)

## 2021-08-15 NOTE — Progress Notes (Signed)
Established Patient Office Visit     This visit occurred during the SARS-CoV-2 public health emergency.  Safety protocols were in place, including screening questions prior to the visit, additional usage of staff PPE, and extensive cleaning of exam room while observing appropriate contact time as indicated for disinfecting solutions.    CC/Reason for Visit: Preoperative clearance  HPI: Karina Allen is a 70 y.o. female who is coming in today for the above mentioned reasons. Past Medical History is significant for: Obesity, impaired glucose tolerance, hypertension, hyperlipidemia.  Over the summer she suffered a fall injuring her right patella which has healed well as well as her left shoulder.  Unfortunately she continues to have significant limitation to range of motion of her left shoulder and she is scheduled to have a left total shoulder replacement.  Date of surgery is not yet planned, we have not yet received any forms.  She is here today for preoperative clearance.  She does not have chest pain or dyspnea on exertion.   Past Medical/Surgical History: Past Medical History:  Diagnosis Date   Arthritis    osteo   Depression    Epiretinal membrane 02/05/2017   Sees Dr. Valetta Close and Dr. Iona Hansen   Essential hypertension 04/27/2014   FH: migraines    GAD (generalized anxiety disorder) 08/04/2015   Hepatitis B    reports dx and tx as a young child, reports no issues since and retested as adult and told did not have hepatitis, she is unsure of type of hep she had   Thyroid disease     Past Surgical History:  Procedure Laterality Date   APPENDECTOMY     CATARACT EXTRACTION     CHOLECYSTECTOMY     ELBOW SURGERY     shattered elbow   endometrial oblation     FOOT SURGERY     RETINAL DETACHMENT SURGERY     TONSILLECTOMY AND ADENOIDECTOMY     TUBAL LIGATION      Social History:  reports that she has never smoked. She has never used smokeless tobacco. She reports current alcohol  use. She reports that she does not use drugs.  Allergies: Allergies  Allergen Reactions   Penicillins    Penicillamine Hives    Family History:  Family History  Problem Relation Age of Onset   Prostate cancer Brother        68     Current Outpatient Medications:    hydrochlorothiazide (HYDRODIURIL) 25 MG tablet, Take 1 tablet (25 mg total) by mouth daily., Disp: 90 tablet, Rfl: 1   levothyroxine (SYNTHROID) 175 MCG tablet, Take 1 tablet (175 mcg total) by mouth daily with breakfast., Disp: 90 tablet, Rfl: 1   olmesartan (BENICAR) 5 MG tablet, Take 2 tablets (10 mg total) by mouth daily., Disp: 180 tablet, Rfl: 1   traMADol (ULTRAM) 50 MG tablet, Take 50 mg by mouth every 8 (eight) hours as needed., Disp: , Rfl:   Review of Systems:  Constitutional: Denies fever, chills, diaphoresis, appetite change and fatigue.  HEENT: Denies photophobia, eye pain, redness, hearing loss, ear pain, congestion, sore throat, rhinorrhea, sneezing, mouth sores, trouble swallowing, neck pain, neck stiffness and tinnitus.   Respiratory: Denies SOB, DOE, cough, chest tightness,  and wheezing.   Cardiovascular: Denies chest pain, palpitations and leg swelling.  Gastrointestinal: Denies nausea, vomiting, abdominal pain, diarrhea, constipation, blood in stool and abdominal distention.  Genitourinary: Denies dysuria, urgency, frequency, hematuria, flank pain and difficulty urinating.  Endocrine: Denies: hot  or cold intolerance, sweats, changes in hair or nails, polyuria, polydipsia. Musculoskeletal: Denies myalgias, back pain, joint swelling, arthralgias and gait problem.  Skin: Denies pallor, rash and wound.  Neurological: Denies dizziness, seizures, syncope, weakness, light-headedness, numbness and headaches.  Hematological: Denies adenopathy. Easy bruising, personal or family bleeding history  Psychiatric/Behavioral: Denies suicidal ideation, mood changes, confusion, nervousness, sleep disturbance and  agitation    Physical Exam: Vitals:   08/15/21 1300  BP: 130/80  Pulse: 86  Temp: 97.9 F (36.6 C)  TempSrc: Oral  SpO2: 97%  Weight: 203 lb 14.4 oz (92.5 kg)  Height: 5\' 4"  (1.626 m)    Body mass index is 35 kg/m.   Constitutional: NAD, calm, comfortable Eyes: PERRL, lids and conjunctivae normal, wears corrective lenses ENMT: Mucous membranes are moist.  Respiratory: clear to auscultation bilaterally, no wheezing, no crackles. Normal respiratory effort. No accessory muscle use.  Cardiovascular: Regular rate and rhythm, no murmurs / rubs / gallops. No extremity edema.  Neurologic: Grossly intact and nonfocal Psychiatric: Normal judgment and insight. Alert and oriented x 3. Normal mood.    Impression and Plan:  Preoperative examination -She has a class I preoperative risk which equates to 3.9% 30-day risk of death, MI or cardiac arrest. -She can proceed to surgery without further cardiac work-up assuming labs are grossly within normal limits.  IGT (impaired glucose tolerance)  - Plan: Hemoglobin A1c  Essential hypertension  - Plan: Comprehensive metabolic panel  Other specified hypothyroidism  - Plan: TSH  Time spent: 31 minutes reviewing chart, interviewing and examining patient and formulating plan of care.    Lelon Frohlich, MD [facility]

## 2021-08-21 ENCOUNTER — Telehealth: Payer: Self-pay | Admitting: Internal Medicine

## 2021-08-21 NOTE — Telephone Encounter (Signed)
Pre-Operative Risk Assessment form to be filled out--placed in dr's folder. Fax to: [phone], Attn: Sherri [phone]

## 2021-08-24 ENCOUNTER — Ambulatory Visit
Admission: RE | Admit: 2021-08-24 | Discharge: 2021-08-24 | Disposition: A | Discharge status: Home / Self Care | Payer: Medicare PPO | Source: Ambulatory Visit | Attending: Orthopaedic Surgery | Admitting: Orthopaedic Surgery

## 2021-08-24 ENCOUNTER — Other Ambulatory Visit: Payer: Self-pay

## 2021-08-24 DIAGNOSIS — M25512 Pain in left shoulder: Secondary | ICD-10-CM

## 2021-08-24 NOTE — Telephone Encounter (Signed)
Placed in Dr Hernandez's folder 

## 2021-08-25 NOTE — Telephone Encounter (Signed)
Form completed and faxed. 

## 2021-09-11 ENCOUNTER — Other Ambulatory Visit: Payer: Self-pay

## 2021-09-11 ENCOUNTER — Encounter (HOSPITAL_BASED_OUTPATIENT_CLINIC_OR_DEPARTMENT_OTHER): Payer: Self-pay | Admitting: Orthopaedic Surgery

## 2021-09-15 ENCOUNTER — Encounter (HOSPITAL_BASED_OUTPATIENT_CLINIC_OR_DEPARTMENT_OTHER)
Admission: RE | Admit: 2021-09-15 | Discharge: 2021-09-15 | Disposition: A | Discharge status: Home / Self Care | Payer: Medicare PPO | Source: Ambulatory Visit | Attending: Orthopaedic Surgery | Admitting: Orthopaedic Surgery

## 2021-09-15 DIAGNOSIS — Z01812 Encounter for preprocedural laboratory examination: Secondary | ICD-10-CM | POA: Diagnosis not present

## 2021-09-15 LAB — BASIC METABOLIC PANEL
Anion gap: 10 (ref 5–15)
BUN: 24 mg/dL — ABNORMAL HIGH (ref 8–23)
CO2: 24 mmol/L (ref 22–32)
Calcium: 9.3 mg/dL (ref 8.9–10.3)
Chloride: 102 mmol/L (ref 98–111)
Creatinine, Ser: 0.79 mg/dL (ref 0.44–1.00)
GFR, Estimated: >60 mL/min (ref >60)
Glucose, Bld: 120 mg/dL — ABNORMAL HIGH (ref 70–99)
Potassium: 3.6 mmol/L (ref 3.5–5.1)
Sodium: 136 mmol/L (ref 135–145)

## 2021-09-15 NOTE — Progress Notes (Signed)
      Enhanced Recovery after Surgery for Orthopedics Enhanced Recovery after Surgery is a protocol used to improve the stress on your body and your recovery after surgery.  Patient Instructions  The night before surgery:  No food after midnight. ONLY clear liquids after midnight  The day of surgery (if you do NOT have diabetes):  Drink ONE (1) Pre-Surgery Clear Ensure as directed.   This drink was given to you during your hospital  pre-op appointment visit. The pre-op nurse will instruct you on the time to drink the  Pre-Surgery Ensure depending on your surgery time. Finish the drink at the designated time by the pre-op nurse.  Nothing else to drink after completing the  Pre-Surgery Clear Ensure.  The day of surgery (if you have diabetes): Drink ONE (1) Gatorade 2 (G2) as directed. This drink was given to you during your hospital  pre-op appointment visit.  The pre-op nurse will instruct you on the time to drink the   Gatorade 2 (G2) depending on your surgery time. Color of the Gatorade may vary. Red is not allowed. Nothing else to drink after completing the  Gatorade 2 (G2).         If you have questions, please contact your surgeon's office. Surgical soap given with instructions, pt verbalized understanding. Benzoyl peroxide gel given with written instructions, pt verbalized understanding.

## 2021-09-18 NOTE — H&P (Signed)
PREOPERATIVE H&P  Chief Complaint: djd left shoulder  HPI: Karina Allen is a 70 y.o. female who is scheduled for Procedure(s): LEFT REVERSE SHOULDER ARTHROPLASTY.   Patient has a past medical history significant for HTN, hepatitis B, thyroid disease.   The patient is a 70 year old female with a previous proximal humerus fracture on 04/21/2021 that was managed nonoperatively following a fall. She continues to have very limited range of motion in her left shoulder and has a constant ache and pain.  She states that her range of motion does not seem to be improving, and she is wanting to discuss possible surgery if this is the direction that she will be moving.    Her symptoms are rated as moderate to severe, and have been worsening.  This is significantly impairing activities of daily living.    Please see clinic note for further details on this patient's care.    She has elected for surgical management.   Past Medical History:  Diagnosis Date   Arthritis    osteo   Depression    Epiretinal membrane 02/05/2017   Sees Dr. Valetta Close and Dr. Iona Hansen   Essential hypertension 04/27/2014   FH: migraines    GAD (generalized anxiety disorder) 08/04/2015   Hepatitis B    reports dx and tx as a young child, reports no issues since and retested as adult and told did not have hepatitis, she is unsure of type of hep she had   Thyroid disease    Past Surgical History:  Procedure Laterality Date   APPENDECTOMY     CATARACT EXTRACTION     CHOLECYSTECTOMY     ELBOW SURGERY     shattered elbow   endometrial oblation     FOOT SURGERY     RETINAL DETACHMENT SURGERY     TONSILLECTOMY AND ADENOIDECTOMY     TUBAL LIGATION     Social History   Socioeconomic History   Marital status: Widowed    Spouse name: Not on file   Number of children: Not on file   Years of education: Not on file   Highest education level: Not on file  Occupational History   Not on file  Tobacco Use   Smoking  status: Never   Smokeless tobacco: Never  Substance and Sexual Activity   Alcohol use: Not Currently    Comment: stopped 5 years ago per patient   Drug use: No   Sexual activity: Not on file  Other Topics Concern   Not on file  Social History Narrative   Work or School: Psychologist, occupational at [employer], 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 Authorizing Provider  hydrochlorothiazide (HYDRODIURIL) 25 MG tablet Take 1 tablet (25 mg total) by mouth daily. 07/11/21  Yes Isaac Bliss,  Rayford Halsted, MD  levothyroxine (SYNTHROID) 175 MCG tablet Take 1 tablet (175 mcg total) by mouth daily with breakfast. 07/11/21  Yes Isaac Bliss, Rayford Halsted, MD  olmesartan (BENICAR) 5 MG tablet Take 2 tablets (10 mg total) by mouth daily. 07/11/21  Yes Isaac Bliss, Rayford Halsted, MD  traMADol (ULTRAM) 50 MG tablet Take 50 mg by mouth every 8 (eight) hours as needed. 07/31/21  Yes [provider]    ROS: All other systems have been reviewed and were otherwise negative with the exception of those mentioned in the HPI and as above.  Physical Exam: General: Alert, no acute distress Cardiovascular: No pedal edema Respiratory: No cyanosis, no use of accessory musculature GI: No organomegaly, abdomen is soft and non-tender Skin: No lesions in the area of chief complaint Neurologic: Sensation intact distally Psychiatric: Patient is  competent for consent with normal mood and affect Lymphatic: No axillary or cervical lymphadenopathy  MUSCULOSKELETAL:  Left shoulder: She has a positive horn blower's.  Active forward elevation to 40. External rotation to -10. Internal rotation to the front pocket. Elbow range of motion is from 45-90.   Imaging: Xrays of the shoulder she has a head split fracture with malunion versus nonunion.  Assessment: djd left shoulder Malunion versus non-union of the left shoulder   Plan: Plan for Procedure(s): LEFT REVERSE SHOULDER ARTHROPLASTY  The risks benefits and alternatives were discussed with the patient including but not limited to the risks of nonoperative treatment, versus surgical intervention including infection, bleeding, nerve injury,  blood clots, cardiopulmonary complications, morbidity, mortality, among others, and they were willing to proceed.   We additionally specifically discussed risks of axillary nerve injury, infection, periprosthetic fracture, continued pain and longevity of implants prior to beginning procedure.    Patient will be closely monitored in PACU for medical stabilization and pain control. If found stable in PACU, patient may be discharged home with outpatient follow-up. If any concerns regarding patient's stabilization patient will be admitted for observation after surgery. The patient is planning to be discharged home with outpatient PT.   The patient acknowledged the explanation, agreed to proceed with the plan and consent was signed.   Patient was cleared by her PCP, Dr. Isaac Bliss.  Operative Plan: Left reverse total shoulder arthroplasty with a performed humeral implant  Discharge Medications: Standard DVT Prophylaxis: Aspirin  Physical Therapy: Outpatient PT Special Discharge needs: [place]. [place], PA-C  09/18/2021 4:32 PM

## 2021-09-20 ENCOUNTER — Ambulatory Visit (HOSPITAL_BASED_OUTPATIENT_CLINIC_OR_DEPARTMENT_OTHER)
Admission: RE | Admit: 2021-09-20 | Discharge: 2021-09-20 | Disposition: A | Discharge status: Home / Self Care | Payer: Medicare PPO | Attending: Orthopaedic Surgery | Admitting: Orthopaedic Surgery

## 2021-09-20 ENCOUNTER — Encounter (HOSPITAL_BASED_OUTPATIENT_CLINIC_OR_DEPARTMENT_OTHER): Payer: Self-pay | Admitting: Orthopaedic Surgery

## 2021-09-20 ENCOUNTER — Encounter (HOSPITAL_BASED_OUTPATIENT_CLINIC_OR_DEPARTMENT_OTHER)
Admission: RE | Disposition: A | Discharge status: Home / Self Care | Payer: Self-pay | Source: Home / Self Care | Attending: Orthopaedic Surgery

## 2021-09-20 ENCOUNTER — Ambulatory Visit (HOSPITAL_BASED_OUTPATIENT_CLINIC_OR_DEPARTMENT_OTHER): Payer: Medicare PPO | Admitting: Anesthesiology

## 2021-09-20 ENCOUNTER — Ambulatory Visit (HOSPITAL_COMMUNITY): Payer: Medicare PPO

## 2021-09-20 ENCOUNTER — Other Ambulatory Visit: Payer: Self-pay

## 2021-09-20 DIAGNOSIS — M19012 Primary osteoarthritis, left shoulder: Secondary | ICD-10-CM | POA: Insufficient documentation

## 2021-09-20 DIAGNOSIS — E039 Hypothyroidism, unspecified: Secondary | ICD-10-CM | POA: Insufficient documentation

## 2021-09-20 DIAGNOSIS — E7800 Pure hypercholesterolemia, unspecified: Secondary | ICD-10-CM | POA: Diagnosis not present

## 2021-09-20 DIAGNOSIS — Z96612 Presence of left artificial shoulder joint: Secondary | ICD-10-CM | POA: Diagnosis not present

## 2021-09-20 DIAGNOSIS — I10 Essential (primary) hypertension: Secondary | ICD-10-CM | POA: Insufficient documentation

## 2021-09-20 DIAGNOSIS — G8918 Other acute postprocedural pain: Secondary | ICD-10-CM | POA: Diagnosis not present

## 2021-09-20 DIAGNOSIS — Z96642 Presence of left artificial hip joint: Secondary | ICD-10-CM | POA: Diagnosis not present

## 2021-09-20 DIAGNOSIS — S42202P Unspecified fracture of upper end of left humerus, subsequent encounter for fracture with malunion: Secondary | ICD-10-CM | POA: Diagnosis not present

## 2021-09-20 DIAGNOSIS — M1990 Unspecified osteoarthritis, unspecified site: Secondary | ICD-10-CM | POA: Insufficient documentation

## 2021-09-20 DIAGNOSIS — Z471 Aftercare following joint replacement surgery: Secondary | ICD-10-CM | POA: Diagnosis not present

## 2021-09-20 DIAGNOSIS — Z09 Encounter for follow-up examination after completed treatment for conditions other than malignant neoplasm: Secondary | ICD-10-CM

## 2021-09-20 DIAGNOSIS — S42352P Displaced comminuted fracture of shaft of humerus, left arm, subsequent encounter for fracture with malunion: Secondary | ICD-10-CM | POA: Diagnosis not present

## 2021-09-20 HISTORY — PX: REVERSE SHOULDER ARTHROPLASTY: SHX5054

## 2021-09-20 SURGERY — ARTHROPLASTY, SHOULDER, TOTAL, REVERSE
Anesthesia: Regional | Site: Shoulder | Laterality: Left

## 2021-09-20 MED ORDER — LIDOCAINE 2% (20 MG/ML) 5 ML SYRINGE
INTRAMUSCULAR | Status: DC | PRN
  Administered 2021-09-20: 40 mg via INTRAVENOUS

## 2021-09-20 MED ORDER — MIDAZOLAM HCL 2 MG/2ML IJ SOLN
INTRAMUSCULAR | Status: AC
  Filled 2021-09-20: qty 2

## 2021-09-20 MED ORDER — PHENYLEPHRINE HCL (PRESSORS) 10 MG/ML IV SOLN
INTRAVENOUS | Status: AC
  Filled 2021-09-20: qty 1

## 2021-09-20 MED ORDER — ROCURONIUM BROMIDE 10 MG/ML (PF) SYRINGE
PREFILLED_SYRINGE | INTRAVENOUS | Status: AC
  Filled 2021-09-20: qty 10

## 2021-09-20 MED ORDER — ROCURONIUM BROMIDE 100 MG/10ML IV SOLN
INTRAVENOUS | Status: DC | PRN
  Administered 2021-09-20: 40 mg via INTRAVENOUS

## 2021-09-20 MED ORDER — FENTANYL CITRATE (PF) 100 MCG/2ML IJ SOLN
INTRAMUSCULAR | Status: AC
  Filled 2021-09-20: qty 2

## 2021-09-20 MED ORDER — CEFAZOLIN SODIUM-DEXTROSE 2-4 GM/100ML-% IV SOLN
INTRAVENOUS | Status: AC
  Filled 2021-09-20: qty 100

## 2021-09-20 MED ORDER — OMEPRAZOLE 20 MG PO CPDR: 20.0000 mg | DELAYED_RELEASE_CAPSULE | Freq: Every day | ORAL | 0 refills | Status: AC

## 2021-09-20 MED ORDER — ATROPINE SULFATE 0.4 MG/ML IV SOLN
INTRAVENOUS | Status: AC
  Filled 2021-09-20: qty 1

## 2021-09-20 MED ORDER — SODIUM CHLORIDE 0.9 % IR SOLN
Status: DC | PRN
  Administered 2021-09-20: 1000 mL

## 2021-09-20 MED ORDER — BUPIVACAINE HCL (PF) 0.25 % IJ SOLN
INTRAMUSCULAR | Status: AC
  Filled 2021-09-20: qty 30

## 2021-09-20 MED ORDER — GABAPENTIN 100 MG PO CAPS: 100.0000 mg | ORAL_CAPSULE | Freq: Three times a day (TID) | ORAL | 0 refills | Status: AC

## 2021-09-20 MED ORDER — LACTATED RINGERS IV SOLN: INTRAVENOUS | Status: DC

## 2021-09-20 MED ORDER — GABAPENTIN 300 MG PO CAPS
ORAL_CAPSULE | ORAL | Status: AC
  Filled 2021-09-20: qty 1

## 2021-09-20 MED ORDER — ACETAMINOPHEN 500 MG PO TABS: 1000.0000 mg | ORAL_TABLET | Freq: Three times a day (TID) | ORAL | 0 refills | Status: AC

## 2021-09-20 MED ORDER — DEXAMETHASONE SODIUM PHOSPHATE 10 MG/ML IJ SOLN
INTRAMUSCULAR | Status: AC
  Filled 2021-09-20: qty 1

## 2021-09-20 MED ORDER — MELOXICAM 15 MG PO TABS: 15.0000 mg | ORAL_TABLET | Freq: Every day | ORAL | 0 refills | Status: DC

## 2021-09-20 MED ORDER — PHENYLEPHRINE HCL-NACL 20-0.9 MG/250ML-% IV SOLN
INTRAVENOUS | Status: DC | PRN
  Administered 2021-09-20: 50 ug/min via INTRAVENOUS

## 2021-09-20 MED ORDER — PROPOFOL 10 MG/ML IV BOLUS
INTRAVENOUS | Status: AC
  Filled 2021-09-20: qty 40

## 2021-09-20 MED ORDER — GLYCOPYRROLATE PF 0.2 MG/ML IJ SOSY
PREFILLED_SYRINGE | INTRAMUSCULAR | Status: AC
  Filled 2021-09-20: qty 2

## 2021-09-20 MED ORDER — EPHEDRINE 5 MG/ML INJ
INTRAVENOUS | Status: AC
  Filled 2021-09-20: qty 5

## 2021-09-20 MED ORDER — BUPIVACAINE LIPOSOME 1.3 % IJ SUSP
INTRAMUSCULAR | Status: DC | PRN
  Administered 2021-09-20: 10 mL via PERINEURAL

## 2021-09-20 MED ORDER — TRANEXAMIC ACID-NACL 1000-0.7 MG/100ML-% IV SOLN
1000.0000 mg | INTRAVENOUS | Status: AC
  Administered 2021-09-20: 1000 mg via INTRAVENOUS

## 2021-09-20 MED ORDER — ACETAMINOPHEN 500 MG PO TABS
1000.0000 mg | ORAL_TABLET | Freq: Once | ORAL | Status: AC
  Administered 2021-09-20: 1000 mg via ORAL

## 2021-09-20 MED ORDER — VANCOMYCIN HCL 1000 MG IV SOLR
INTRAVENOUS | Status: DC | PRN
  Administered 2021-09-20: 1000 mg via TOPICAL

## 2021-09-20 MED ORDER — PHENYLEPHRINE 40 MCG/ML (10ML) SYRINGE FOR IV PUSH (FOR BLOOD PRESSURE SUPPORT)
PREFILLED_SYRINGE | INTRAVENOUS | Status: AC
  Filled 2021-09-20: qty 10

## 2021-09-20 MED ORDER — CEFAZOLIN SODIUM-DEXTROSE 2-4 GM/100ML-% IV SOLN
2.0000 g | INTRAVENOUS | Status: AC
  Administered 2021-09-20: 2 g via INTRAVENOUS

## 2021-09-20 MED ORDER — GLYCOPYRROLATE 0.2 MG/ML IJ SOLN
INTRAMUSCULAR | Status: DC | PRN
  Administered 2021-09-20: .2 mg via INTRAVENOUS

## 2021-09-20 MED ORDER — ONDANSETRON HCL 4 MG/2ML IJ SOLN: 4.0000 mg | Freq: Once | INTRAMUSCULAR | Status: DC | PRN

## 2021-09-20 MED ORDER — DEXAMETHASONE SODIUM PHOSPHATE 10 MG/ML IJ SOLN
INTRAMUSCULAR | Status: DC | PRN
  Administered 2021-09-20: 10 mg via INTRAVENOUS

## 2021-09-20 MED ORDER — SUGAMMADEX SODIUM 200 MG/2ML IV SOLN
INTRAVENOUS | Status: DC | PRN
  Administered 2021-09-20: 200 mg via INTRAVENOUS

## 2021-09-20 MED ORDER — GABAPENTIN 300 MG PO CAPS
300.0000 mg | ORAL_CAPSULE | Freq: Once | ORAL | Status: AC
  Administered 2021-09-20: 300 mg via ORAL

## 2021-09-20 MED ORDER — BUPIVACAINE HCL (PF) 0.5 % IJ SOLN
INTRAMUSCULAR | Status: DC | PRN
  Administered 2021-09-20: 15 mL via PERINEURAL

## 2021-09-20 MED ORDER — LIDOCAINE 2% (20 MG/ML) 5 ML SYRINGE
INTRAMUSCULAR | Status: AC
  Filled 2021-09-20: qty 5

## 2021-09-20 MED ORDER — ONDANSETRON HCL 4 MG/2ML IJ SOLN
INTRAMUSCULAR | Status: AC
  Filled 2021-09-20: qty 2

## 2021-09-20 MED ORDER — ONDANSETRON HCL 4 MG PO TABS: 4.0000 mg | ORAL_TABLET | Freq: Three times a day (TID) | ORAL | 0 refills | Status: AC | PRN

## 2021-09-20 MED ORDER — AMISULPRIDE (ANTIEMETIC) 5 MG/2ML IV SOLN: 10.0000 mg | Freq: Once | INTRAVENOUS | Status: DC | PRN

## 2021-09-20 MED ORDER — PROPOFOL 10 MG/ML IV BOLUS
INTRAVENOUS | Status: DC | PRN
  Administered 2021-09-20: 100 mg via INTRAVENOUS

## 2021-09-20 MED ORDER — FENTANYL CITRATE (PF) 100 MCG/2ML IJ SOLN
INTRAMUSCULAR | Status: DC | PRN
  Administered 2021-09-20: 100 ug via INTRAVENOUS

## 2021-09-20 MED ORDER — FENTANYL CITRATE (PF) 100 MCG/2ML IJ SOLN
25.0000 ug | INTRAMUSCULAR | Status: DC | PRN
  Administered 2021-09-20: 50 ug via INTRAVENOUS
  Administered 2021-09-20: 25 ug via INTRAVENOUS

## 2021-09-20 MED ORDER — OXYCODONE HCL 5 MG PO TABS: ORAL_TABLET | ORAL | 0 refills | Status: AC

## 2021-09-20 MED ORDER — VANCOMYCIN HCL 1000 MG IV SOLR
INTRAVENOUS | Status: AC
  Filled 2021-09-20: qty 40

## 2021-09-20 MED ORDER — ASPIRIN 81 MG PO CHEW: 81.0000 mg | CHEWABLE_TABLET | Freq: Two times a day (BID) | ORAL | 0 refills | Status: AC

## 2021-09-20 MED ORDER — ACETAMINOPHEN 500 MG PO TABS
ORAL_TABLET | ORAL | Status: AC
  Filled 2021-09-20: qty 2

## 2021-09-20 MED ORDER — PHENYLEPHRINE HCL (PRESSORS) 10 MG/ML IV SOLN
INTRAVENOUS | Status: DC | PRN
  Administered 2021-09-20: 120 ug via INTRAVENOUS
  Administered 2021-09-20: 80 ug via INTRAVENOUS
  Administered 2021-09-20: 120 ug via INTRAVENOUS
  Administered 2021-09-20: 80 ug via INTRAVENOUS

## 2021-09-20 MED ORDER — TRANEXAMIC ACID-NACL 1000-0.7 MG/100ML-% IV SOLN
INTRAVENOUS | Status: AC
  Filled 2021-09-20: qty 100

## 2021-09-20 MED ORDER — MIDAZOLAM HCL 2 MG/2ML IJ SOLN
2.0000 mg | Freq: Once | INTRAMUSCULAR | Status: AC
  Administered 2021-09-20: 2 mg via INTRAVENOUS

## 2021-09-20 MED ORDER — EPHEDRINE SULFATE 50 MG/ML IJ SOLN
INTRAMUSCULAR | Status: DC | PRN
  Administered 2021-09-20: 15 mg via INTRAVENOUS
  Administered 2021-09-20 (×3): 10 mg via INTRAVENOUS

## 2021-09-20 MED ORDER — ONDANSETRON HCL 4 MG/2ML IJ SOLN
INTRAMUSCULAR | Status: DC | PRN
  Administered 2021-09-20: 4 mg via INTRAVENOUS

## 2021-09-20 SURGICAL SUPPLY — 73 items
AID PSTN UNV HD RSTRNT DISP (MISCELLANEOUS) ×1
APL PRP STRL LF DISP 70% ISPRP (MISCELLANEOUS) ×1
BASEPLATE GLENOID RSA 3X25 0D (Shoulder) ×2 IMPLANT
BIT DRILL 3.2 PERIPHERAL SCREW (BIT) ×2 IMPLANT
BLADE HEX COATED 2.75 (ELECTRODE) IMPLANT
BLADE SAW SAG 73X25 THK (BLADE) ×1
BLADE SAW SGTL 73X25 THK (BLADE) ×1 IMPLANT
BLADE SURG 10 STRL SS (BLADE) IMPLANT
BLADE SURG 15 STRL LF DISP TIS (BLADE) IMPLANT
BLADE SURG 15 STRL SS (BLADE)
BNDG COHESIVE 4X5 TAN ST LF (GAUZE/BANDAGES/DRESSINGS) IMPLANT
BRUSH SCRUB EZ PLAIN DRY (MISCELLANEOUS) ×2 IMPLANT
BSPLAT GLND +3 25 (Shoulder) ×1 IMPLANT
CHLORAPREP W/TINT 26 (MISCELLANEOUS) ×2 IMPLANT
CLSR STERI-STRIP ANTIMIC 1/2X4 (GAUZE/BANDAGES/DRESSINGS) ×2 IMPLANT
COOLER ICEMAN CLASSIC (MISCELLANEOUS) ×2 IMPLANT
COVER BACK TABLE 60X90IN (DRAPES) ×2 IMPLANT
COVER MAYO STAND STRL (DRAPES) ×2 IMPLANT
DECANTER SPIKE VIAL GLASS SM (MISCELLANEOUS) IMPLANT
DRAPE IMP U-DRAPE 54X76 (DRAPES) ×2 IMPLANT
DRAPE INCISE IOBAN 66X45 STRL (DRAPES) ×2 IMPLANT
DRAPE U-SHAPE 76X120 STRL (DRAPES) ×4 IMPLANT
DRSG AQUACEL AG ADV 3.5X 6 (GAUZE/BANDAGES/DRESSINGS) ×2 IMPLANT
ELECT BLADE 4.0 EZ CLEAN MEGAD (MISCELLANEOUS) ×2
ELECT REM PT RETURN 9FT ADLT (ELECTROSURGICAL) ×2
ELECTRODE BLDE 4.0 EZ CLN MEGD (MISCELLANEOUS) ×1 IMPLANT
ELECTRODE REM PT RTRN 9FT ADLT (ELECTROSURGICAL) ×1 IMPLANT
FACESHIELD WRAPAROUND (MASK) ×2 IMPLANT
GLOVE SRG 8 PF TXTR STRL LF DI (GLOVE) ×1 IMPLANT
GLOVE SURG ENC MOIS LTX SZ6.5 (GLOVE) ×6 IMPLANT
GLOVE SURG LTX SZ8 (GLOVE) ×2 IMPLANT
GLOVE SURG UNDER POLY LF SZ6.5 (GLOVE) ×2 IMPLANT
GLOVE SURG UNDER POLY LF SZ7 (GLOVE) ×6 IMPLANT
GLOVE SURG UNDER POLY LF SZ8 (GLOVE) ×2
GOWN STRL REUS W/ TWL LRG LVL3 (GOWN DISPOSABLE) ×2 IMPLANT
GOWN STRL REUS W/TWL LRG LVL3 (GOWN DISPOSABLE) ×4
GOWN STRL REUS W/TWL XL LVL3 (GOWN DISPOSABLE) ×2 IMPLANT
GUIDE PIN 3X75 SHOULDER (PIN) ×2
GUIDEWIRE GLENOID 2.5X220 (WIRE) ×2 IMPLANT
HANDPIECE INTERPULSE COAX TIP (DISPOSABLE) ×2
IMPL REVERSE SHOULDER 0X3.5 (Shoulder) ×1 IMPLANT
IMPLANT REVERSE SHOULDER 0X3.5 (Shoulder) ×2 IMPLANT
INSERT HUMERAL 36X6MM 12.5DEG (Insert) ×2 IMPLANT
KIT STABILIZATION SHOULDER (MISCELLANEOUS) ×2 IMPLANT
MANIFOLD NEPTUNE II (INSTRUMENTS) ×2 IMPLANT
PACK BASIN DAY SURGERY FS (CUSTOM PROCEDURE TRAY) ×2 IMPLANT
PACK SHOULDER (CUSTOM PROCEDURE TRAY) ×2 IMPLANT
PAD COLD SHLDR WRAP-ON (PAD) ×2 IMPLANT
PAD ORTHO SHOULDER 7X19 LRG (SOFTGOODS) ×2 IMPLANT
PENCIL SMOKE EVACUATOR (MISCELLANEOUS) ×2 IMPLANT
PIN GUIDE 3X75 SHOULDER (PIN) ×1 IMPLANT
RESTRAINT HEAD UNIVERSAL NS (MISCELLANEOUS) ×2 IMPLANT
SCREW 5.5X26 (Screw) ×2 IMPLANT
SCREW BONE 6.5X40 SM (Screw) ×2 IMPLANT
SCREW PERIPHERAL 5.0X46 (Screw) ×2 IMPLANT
SET HNDPC FAN SPRY TIP SCT (DISPOSABLE) ×1 IMPLANT
SHEET MEDIUM DRAPE 40X70 STRL (DRAPES) ×2 IMPLANT
SLEEVE SCD COMPRESS KNEE MED (STOCKING) ×2 IMPLANT
SPHERE GLENOID LAT REV 36 (Joint) ×2 IMPLANT
SPONGE T-LAP 18X18 ~~LOC~~+RFID (SPONGE) IMPLANT
STEM HUMERAL 3B LONG 98 (Stem) ×1 IMPLANT
STEM HUMERAL SZ 3B LONG 98MM (Stem) ×2 IMPLANT
SUT ETHIBOND 2 V 37 (SUTURE) ×2 IMPLANT
SUT ETHIBOND NAB CT1 #1 30IN (SUTURE) ×2 IMPLANT
SUT FIBERWIRE #5 38 CONV NDL (SUTURE) ×8
SUT MNCRL AB 4-0 PS2 18 (SUTURE) IMPLANT
SUT VIC AB 0 CT1 27 (SUTURE)
SUT VIC AB 0 CT1 27XCR 8 STRN (SUTURE) IMPLANT
SUT VIC AB 3-0 SH 27 (SUTURE) ×2
SUT VIC AB 3-0 SH 27X BRD (SUTURE) ×1 IMPLANT
SUTURE FIBERWR #5 38 CONV NDL (SUTURE) ×4 IMPLANT
TOWEL GREEN STERILE FF (TOWEL DISPOSABLE) ×6 IMPLANT
TUBE SUCTION HIGH CAP CLEAR NV (SUCTIONS) ×2 IMPLANT

## 2021-09-20 NOTE — Anesthesia Procedure Notes (Signed)
Anesthesia Regional Block: Interscalene brachial plexus block   Pre-Anesthetic Checklist: , timeout performed,  Correct Patient, Correct Site, Correct Laterality,  Correct Procedure, Correct Position, site marked,  Risks and benefits discussed,  Surgical consent,  Pre-op evaluation,  At surgeon's request and post-op pain management  Laterality: Left  Prep: chloraprep       Needles:  Injection technique: Single-shot  Needle Type: Echogenic Stimulator Needle     Needle Length: 9cm  Needle Gauge: 21     Additional Needles:   Procedures:,,,, ultrasound used (permanent image in chart),,    Narrative:  Start time: 09/20/2021 7:35 AM End time: 09/20/2021 7:45 AM Injection made incrementally with aspirations every 5 mL.  Performed by: Personally  Anesthesiologist: Murvin Natal, MD  Additional Notes: Functioning IV was confirmed and monitors were applied.  A timeout was performed. Sterile prep, hand hygiene and sterile gloves were used. A 32mm 21ga Arrow echogenic stimulator needle was used. Negative aspiration and negative test dose prior to incremental administration of local anesthetic. The patient tolerated the procedure well.  Ultrasound guidance: relevent anatomy identified, needle position confirmed, local anesthetic spread visualized around nerve(s), vascular puncture avoided.  Image printed for medical record.

## 2021-09-20 NOTE — Progress Notes (Signed)
AssistedDr. Ellender with left, ultrasound guided, interscalene  block. Side rails up, monitors on throughout procedure. See vital signs in flow sheet. Tolerated Procedure well.  

## 2021-09-20 NOTE — Op Note (Signed)
Orthopaedic Surgery Operative Note (CSN: [id number])  Karina Allen  05-Aug-1951 Date of Surgery: 09/20/2021   Diagnoses:  Left shoulder humeral malunion  Procedure: Left reverse total Shoulder Arthroplasty Open treatment humeral malunion 24430   Operative Finding Successful completion of planned procedure.  We initially attempted to use Tornier perform humeral implants and thus lateralized on the glenoid side more than we typically would with standard flex instrumentation.  Unfortunately due to the patient's malunion we were not able to obtain reasonable purchase with a Perform implant.  We transitioned to a flex longstem which obtained appropriate purchase however the overall construct was slightly tighter than typical.  We are quite happy overall.  The patient's preoperative motion was only about 40 degrees of forward elevation for the scapular motion took over.  This was a extremely malunited proximal humerus with a head split.  Post-operative plan: The patient will be NWB in sling.  The patient will be will be discharged from PACU if continues to be stable as was plan prior to surgery.  DVT prophylaxis Aspirin 81 mg twice daily for 6 weeks.  Pain control with PRN pain medication preferring oral medicines.  Follow up plan will be scheduled in approximately 7 days for incision check and XR.  Physical therapy to start after 2 week.  Implants:Tornier flex 3 long stem, 0 high offset tray with a 36+6 polyethylene.  25+3 baseplate and 98+3 glenosphere  Post-Op Diagnosis: Same Surgeons:Primary: Hiram Gash, MD Assistants:[name] McBane PA-C Location: [facility] OR ROOM 6 Anesthesia: General with Exparel Interscalene Antibiotics: Ancef 2g preop, Vancomycin 1000mg  locally Tourniquet time: None Estimated Blood Loss: 382 Complications: None Specimens: None Implants: Implant Name Type Inv. Item Serial No. Manufacturer Lot No. LRB No. Used Action  BASEPLATE GLENOID RSA [id number] - [id number] Shoulder  BASEPLATE GLENOID RSA [id number]  [employer] [id number] Left 1 Implanted  aequalis perform reversed glenosphere Orthopedic Implant  [id number] [employer]  Left 1 Implanted  STEM HUMERAL SZ 3B LONG 98MM - [id number] Stem STEM HUMERAL SZ 3B LONG 98MM [id number] [employer]  Left 1 Implanted  INSERT HUMERAL 36X6MM 12.5DEG - [id number] Insert INSERT HUMERAL 36X6MM 12.5DEG [id number] [employer]  Left 1 Implanted  IMPLANT REVERSE SHOULDER 0X3.5 - [id number] Shoulder IMPLANT REVERSE SHOULDER 0X3.5 [id number] [employer]  Left 1 Implanted  SCREW BONE 6.5X40 SM - [id number] Screw SCREW BONE 6.5X40 SM  [employer]  Left 1 Implanted  SCREW PERIPHERAL 5.0X46 - [id number] Screw SCREW PERIPHERAL 5.0X46  [employer]  Left 1 Implanted  SCREW 5.5X26 - [id number] Screw SCREW 5.5X26  [employer]  Left 1 Implanted    Indications for Surgery:   Karina Allen is a 70 y.o. female with previous fracture and malunited proximal humerus with a head split.  Benefits and risks of operative and nonoperative management were discussed prior to surgery with patient/guardian(s) and informed consent form was completed.  Infection and need for further surgery were discussed as was prosthetic stability and cuff issues.  We additionally specifically discussed risks of axillary nerve injury, infection, periprosthetic fracture, continued pain and longevity of implants prior to beginning procedure.      Procedure:   The patient was identified in the preoperative holding area where the surgical site was marked. Block placed by anesthesia with exparel.  The patient was taken to the OR where a procedural timeout was called and the above noted anesthesia was induced.  The patient was positioned beachchair on allen table with spider arm positioner.  Preoperative  antibiotics were dosed.  The patient's left shoulder was prepped and draped in the usual sterile fashion.  A second preoperative timeout was called.       Standard deltopectoral  approach was performed with a #10 blade. We dissected down to the subcutaneous tissues and the cephalic vein was taken laterally with the deltoid. Clavipectoral fascia was incised in line with the incision. Deep retractors were placed. The long of the biceps tendon was identified and there was significant tenosynovitis present.  Tenodesis was performed to the pectoralis tendon with #2 Ethibond. The remaining biceps was followed up into the rotator interval where it was released.   The subscapularis was taken down in a full thickness layer with capsule along the humeral neck extending inferiorly around the humeral head. We continued releasing the capsule directly off of the osteophytes inferiorly all the way around the corner. This allowed [place] to dislocate the humeral head.  This was more difficult than typical and the patient had a clear malunion of the head posterior to the shaft.  We were able to perform an osteotomy to correct this malunion.  The humeral head had evidence of severe osteoarthritic wear with full-thickness cartilage loss and exposed subchondral bone there was a malunited proximal humerus and the head was split.  The subscap was quite thin however the superior cuff was relatively intact.. There was significant flattening of the humeral head.   The rotator cuff was carefully examined and noted to be irreperably torn.  The decision was confirmed that a reverse total shoulder was indicated for this patient.  There were osteophytes along the inferior humeral neck. The osteophytes were removed with an osteotome and a rongeur.  Osteophytes were removed with a rongeur and an osteotome and the anatomic neck was well visualized.     We initially attempted to use the Tornier perform humeral implants however were unable to obtain good fixation with this.  He had prepped for this.  We then switched to the Tornier flex system with a longstem we broached to a 3B long and were able to obtain good  fit.   The subscapularis was again identified and immediately we took care to palpate the axillary nerve anteriorly and verify its position with gentle palpation as well as the tug test.  We then released the SGHL with bovie cautery prior to placing a curved mayo at the junction of the anterior glenoid well above the axillary nerve and bluntly dissecting the subscapularis from the capsule.  We then carefully protected the axillary nerve as we gently released the inferior capsule to fully mobilize the subscapularis.  An anterior deltoid retractor was then placed as well as a small Hohmann retractor superiorly.   The glenoid was relatively intact in the setting of a humeral nonunion and malunion  The remaining labrum was removed circumferentially taking great care not to disrupt the posterior capsule.   The glenoid drill guide was placed and used to drill a guide pin in the center, inferior position. The glenoid face was then reamed concentrically over the guide wire. The center hole was drilled over the guidepin in a near anatomic angle of version. Next the glenoid vault was drilled back to a depth of 40 mm.  We tapped and then placed a 46mm size baseplate with additional 63mm lateralization was selected with a 6.5 mm x 40 mm length central screw.  The base plate was screwed into the glenoid vault obtaining secure fixation. We next placed superior and inferior locking  screws for additional fixation.  Next a 36 mm glenosphere with 3 mm of lateralization was selected and impacted onto the baseplate. The center screw was tightened.  We turned attention back to the humeral side. The cut protector was removed. We trialed with multiple size tray and polyethylene options and selected a 6 which provided good stability and range of motion without excess soft tissue tension. The offset was dialed in to match the normal anatomy. The shoulder was trialed.  There was good ROM in all planes and the shoulder was stable  with no inferior translation.  The real humeral implants were opened after again confirming sizes.  The trial was removed. #5 Fiberwire x4 sutures passed through the humeral neck for subscap repair. The humeral component was press-fit obtaining a secure fit. A +0 high offset tray was selected and impacted onto the stem.  A 36+6 polyethylene liner was impacted onto the stem.  The joint was reduced and thoroughly irrigated with pulsatile lavage. Subscap was repaired back with #5 Fiberwire sutures through bone tunnels. Hemostasis was obtained. The deltopectoral interval was reapproximated with #1 Ethibond. The subcutaneous tissues were closed with 2-0 Vicryl and the skin was closed with running monocryl.    The wounds were cleaned and dried and an Aquacel dressing was placed. The drapes taken down. The arm was placed into sling with abduction pillow. Patient was awakened, extubated, and transferred to the recovery room in stable condition. There were no intraoperative complications. The sponge, needle, and attention counts were  correct at the end of the case.       Noemi Chapel, PA-C, present and scrubbed throughout the case, critical for completion in a timely fashion, and for retraction, instrumentation, closure.

## 2021-09-20 NOTE — Anesthesia Procedure Notes (Signed)
Procedure Name: Intubation Date/Time: 09/20/2021 8:36 AM Performed by: Lavonia Dana, CRNA Pre-anesthesia Checklist: Patient identified, Emergency Drugs available, Suction available and Patient being monitored Patient Re-evaluated:Patient Re-evaluated prior to induction Oxygen Delivery Method: Circle system utilized Preoxygenation: Pre-oxygenation with 100% oxygen Induction Type: IV induction Ventilation: Mask ventilation without difficulty and Oral airway inserted - appropriate to patient size Laryngoscope Size: Mac and 3 Grade View: Grade I Tube type: Oral Tube size: 7.0 mm Number of attempts: 1 Airway Equipment and Method: Stylet, Oral airway and Bite block Placement Confirmation: ETT inserted through vocal cords under direct vision, positive ETCO2 and breath sounds checked- equal and bilateral Secured at: 22 cm Tube secured with: Tape Dental Injury: Teeth and Oropharynx as per pre-operative assessment

## 2021-09-20 NOTE — Anesthesia Preprocedure Evaluation (Addendum)
Anesthesia Evaluation  Patient identified by MRN, date of birth, ID band Patient awake    Reviewed: Allergy & Precautions, NPO status , Patient's Chart, lab work & pertinent test results  Airway Mallampati: II  TM Distance: >3 FB Neck ROM: Full    Dental no notable dental hx.    Pulmonary neg pulmonary ROS,    Pulmonary exam normal breath sounds clear to auscultation       Cardiovascular hypertension, Pt. on medications Normal cardiovascular exam Rhythm:Regular Rate:Normal  ECG: NSR, rate 78   Neuro/Psych PSYCHIATRIC DISORDERS Anxiety Depression negative neurological ROS     GI/Hepatic negative GI ROS, (+) Hepatitis -  Endo/Other  Hypothyroidism   Renal/GU negative Renal ROS     Musculoskeletal  (+) Arthritis ,   Abdominal (+) + obese,   Peds  Hematology negative hematology ROS (+)   Anesthesia Other Findings djd left shoulder  Reproductive/Obstetrics                            Anesthesia Physical Anesthesia Plan  ASA: 3  Anesthesia Plan: General and Regional   Post-op Pain Management: GA combined w/ Regional for post-op pain   Induction: Intravenous  PONV Risk Score and Plan: 3 and Ondansetron, Dexamethasone, Midazolam and Treatment may vary due to age or medical condition  Airway Management Planned: Oral ETT  Additional Equipment:   Intra-op Plan:   Post-operative Plan: Extubation in OR  Informed Consent: I have reviewed the patients History and Physical, chart, labs and discussed the procedure including the risks, benefits and alternatives for the proposed anesthesia with the patient or authorized representative who has indicated his/her understanding and acceptance.     Dental advisory given  Plan Discussed with: CRNA  Anesthesia Plan Comments:        Anesthesia Quick Evaluation

## 2021-09-20 NOTE — Transfer of Care (Signed)
Immediate Anesthesia Transfer of Care Note  Patient: Karina Allen  Procedure(s) Performed: LEFT REVERSE SHOULDER ARTHROPLASTY (Left: Shoulder)  Patient Location: PACU  Anesthesia Type:GA combined with regional for post-op pain  Level of Consciousness: awake, alert  and oriented  Airway & Oxygen Therapy: Patient Spontanous Breathing and Patient connected to face mask oxygen  Post-op Assessment: Report given to RN and Post -op Vital signs reviewed and stable  Post vital signs: Reviewed and stable  Last Vitals:  Vitals Value Taken Time  BP 135/61 09/20/21 1011  Temp    Pulse 98 09/20/21 1015  Resp 14 09/20/21 1015  SpO2 99 % 09/20/21 1015  Vitals shown include unvalidated device data.  Last Pain:  Vitals:   09/20/21 0700  TempSrc: Oral  PainSc: 0-No pain      Patients Stated Pain Goal: 1 (26/20/35 5974)  Complications: No notable events documented.

## 2021-09-20 NOTE — Interval H&P Note (Signed)
All questions answered, patient wants to proceed with procedure. ? ?

## 2021-09-20 NOTE — Anesthesia Postprocedure Evaluation (Signed)
Anesthesia Post Note  Patient: Karina Allen  Procedure(s) Performed: LEFT REVERSE SHOULDER ARTHROPLASTY (Left: Shoulder)     Patient location during evaluation: PACU Anesthesia Type: Regional and General Level of consciousness: awake Pain management: pain level controlled Vital Signs Assessment: post-procedure vital signs reviewed and stable Respiratory status: spontaneous breathing, nonlabored ventilation, respiratory function stable and patient connected to nasal cannula oxygen Cardiovascular status: blood pressure returned to baseline and stable Postop Assessment: no apparent nausea or vomiting Anesthetic complications: no   No notable events documented.  Last Vitals:  Vitals:   09/20/21 1100 09/20/21 1122  BP: (!) 121/54 (!) 114/51  Pulse: 91 83  Resp: 14 16  Temp:  36.7 C  SpO2: 98% 95%    Last Pain:  Vitals:   09/20/21 1122  TempSrc:   PainSc: 3                  [name] P Coby [place]

## 2021-09-20 NOTE — Discharge Instructions (Addendum)
Ophelia Charter MD, MPH Noemi Chapel, PA-C [place] [address] [phone] (tel)   [phone] (fax)   [place] may leave the operative dressing in place until your follow-up appointment. KEEP THE INCISIONS CLEAN AND DRY. There may be a small amount of fluid/bleeding leaking at the surgical site. This is normal after surgery.  If it fills with liquid or blood please call us immediately to change it for you. Use the provided ice machine or Ice packs as often as possible for the first 3-4 days, then as needed for pain relief.   Keep a layer of cloth or a shirt between your skin and the cooling unit to prevent frost bite as it can get very cold.  SHOWERING: - You may shower on Post-Op Day #2.  - The dressing is water resistant but do not scrub it as it may start to peel up.   - You may remove the sling for showering, but keep a water resistant pillow under the arm to keep both the  elbow and shoulder away from the body (mimicking the abduction sling).  - Gently pat the area dry.  - Do not soak the shoulder in water. Do not go swimming in the pool or ocean until your incision has completely healed (about 4 to 6 weeks after surgery) - KEEP THE INCISIONS CLEAN AND DRY.  EXERCISES Wear the sling at all times (including when you sleep!) You may remove the sling for showering, but keep the arm across the chest or in a secondary sling.    Accidental/Purposeful External Rotation and shoulder flexion (reaching behind you) is to be avoided at all costs for the first month. It is ok to come out of your sling if your are sitting and have assistance for eating.   Do not lift anything heavier than 1 pound until we discuss it further in clinic.   REGIONAL ANESTHESIA (NERVE BLOCKS) The anesthesia team may have performed a nerve block for you if safe in the setting of your care.  This is a great  tool used to minimize pain.  Typically the block may start wearing off overnight but the long acting medicine may last for 3-4 days.  The nerve block wearing off can be a challenging period but please utilize your as needed pain medications to try and manage this period.    POST-OP MEDICATIONS- Multimodal approach to pain control In general your pain will be controlled with a combination of substances.  Prescriptions unless otherwise discussed are electronically sent to your pharmacy.  This is a carefully made plan we use to minimize narcotic use.     Meloxicam - Anti-inflammatory medication taken on a scheduled basis Acetaminophen - Non-narcotic pain medicine taken on a scheduled basis  Gabapentin - this is a medication to help with pain, take on a scheduled basis Oxycodone - This is a strong narcotic, to be used only on an "as needed" basis for SEVERE pain. Aspirin 81mg  - This medicine is used to minimize the risk of blood clots after surgery. Omeprazole - daily medicine to protect your stomach while taking anti-inflammatories.   Zofran -  take as needed for nausea   FOLLOW-UP If you develop a Fever (>101.5), Redness or Drainage from the surgical incision site, please call our office to arrange for an evaluation. Please call the office to schedule a follow-up appointment for a wound check, 7-10 days post-operatively.  IF YOU HAVE ANY QUESTIONS, PLEASE FEEL FREE TO CALL OUR OFFICE.  HELPFUL INFORMATION  If you had a block, it will wear off between 8-24 hrs postop typically.  This is period when your pain may go from nearly zero to the pain you would have had post-op without the block.  This is an abrupt transition but nothing dangerous is happening.  You may take an extra dose of narcotic when this happens.  Your arm will be in a sling following surgery. You will be in this sling for the next 4 weeks.  I will let you know the exact duration at your follow-up visit.  You may be more  comfortable sleeping in a semi-seated position the first few nights following surgery.  Keep a pillow propped under the elbow and forearm for comfort.  If you have a recliner type of chair it might be beneficial.  If not that is fine too, but it would be helpful to sleep propped up with pillows behind your operated shoulder as well under your elbow and forearm.  This will reduce pulling on the suture lines.  When dressing, put your operative arm in the sleeve first.  When getting undressed, take your operative arm out last.  Loose fitting, button-down shirts are recommended.  In most states it is against the law to drive while your arm is in a sling. And certainly against the law to drive while taking narcotics.  You may return to work/school in the next couple of days when you feel up to it. Desk work and typing in the sling is fine.  We suggest you use the pain medication the first night prior to going to bed, in order to ease any pain when the anesthesia wears off. You should avoid taking pain medications on an empty stomach as it will make you nauseous.  Do not drink alcoholic beverages or take illicit drugs when taking pain medications.  You should wean off of the pain medication as soon as you are able, most patients are off of narcotics by their first post-op visit  Pain medication may make you constipated.  Below are a few solutions to try in this order: Decrease the amount of pain medication if you aren't having pain. Drink lots of decaffeinated fluids. Drink prune juice and/or each dried prunes  If the first 3 don't work start with additional solutions Take Colace - an over-the-counter stool softener Take Senokot - an over-the-counter laxative Take Miralax - a stronger over-the-counter laxative   Dental Antibiotics:  In most cases prophylactic antibiotics for Dental procdeures after total joint surgery are not necessary.  Exceptions are as follows:  1. History of prior total  joint infection  2. Severely immunocompromised (Organ Transplant, cancer chemotherapy, Rheumatoid biologic meds such as [place])  3. Poorly controlled diabetes (A1C &gt; 8.0, blood glucose over 200)  If you have one of these conditions, contact your surgeon for an antibiotic prescription, prior to your dental procedure.   For more information including helpful videos and documents visit our website:   [email or web address]   Information for Discharge Teaching: EXPAREL (bupivacaine liposome injectable suspension)   Your surgeon or anesthesiologist gave you EXPAREL(bupivacaine) to help control your pain after surgery.  EXPAREL is a local anesthetic that provides pain relief by numbing the tissue around the surgical site. EXPAREL is designed to release pain medication over time and can control pain for up to 72 hours. Depending on how you respond to EXPAREL, you may require less pain  medication during your recovery.  Possible side effects: Temporary loss of sensation or ability to move in the area where bupivacaine was injected. Nausea, vomiting, constipation Rarely, numbness and tingling in your mouth or lips, lightheadedness, or anxiety may occur. Call your doctor right away if you think you may be experiencing any of these sensations, or if you have other questions regarding possible side effects.  Follow all other discharge instructions given to you by your surgeon or nurse. Eat a healthy diet and drink plenty of water or other fluids.  If you return to the hospital for any reason within 96 hours following the administration of EXPAREL, it is important for health care providers to know that you have received this anesthetic. A teal colored band has been placed on your arm with the date, time and amount of EXPAREL you have received in order to alert and inform your health care providers. Please leave this armband in place for the full 96 hours following  administration, and then you may remove the band.  Next dose of Tylenol after 1pm as needed for pain.   Post Anesthesia Home Care Instructions  Activity: Get plenty of rest for the remainder of the day. A responsible individual must stay with you for 24 hours following the procedure.  For the next 24 hours, DO NOT: -Drive a car -Paediatric nurse -Drink alcoholic beverages -Take any medication unless instructed by your physician -Make any legal decisions or sign important papers.  Meals: Start with liquid foods such as gelatin or soup. Progress to regular foods as tolerated. Avoid greasy, spicy, heavy foods. If nausea and/or vomiting occur, drink only clear liquids until the nausea and/or vomiting subsides. Call your physician if vomiting continues.  Special Instructions/Symptoms: Your throat may feel dry or sore from the anesthesia or the breathing tube placed in your throat during surgery. If this causes discomfort, gargle with warm salt water. The discomfort should disappear within 24 hours.  If you had a scopolamine patch placed behind your ear for the management of post- operative nausea and/or vomiting:  1. The medication in the patch is effective for 72 hours, after which it should be removed.  Wrap patch in a tissue and discard in the trash. Wash hands thoroughly with soap and water. 2. You may remove the patch earlier than 72 hours if you experience unpleasant side effects which may include dry mouth, dizziness or visual disturbances. 3. Avoid touching the patch. Wash your hands with soap and water after contact with the patch.      Regional Anesthesia Blocks  1. Numbness or the inability to move the "blocked" extremity may last from 3-48 hours after placement. The length of time depends on the medication injected and your individual response to the medication. If the numbness is not going away after 48 hours, call your surgeon.  2. The extremity that is blocked will need to  be protected until the numbness is gone and the  Strength has returned. Because you cannot feel it, you will need to take extra care to avoid injury. Because it may be weak, you may have difficulty moving it or using it. You may not know what position it is in without looking at it while the block is in effect.  3. For blocks in the legs and feet, returning to weight bearing and walking needs to be done carefully. You will need to wait until the numbness is entirely gone and the strength has returned. You should be able to  move your leg and foot normally before you try and bear weight or walk. You will need someone to be with you when you first try to ensure you do not fall and possibly risk injury.  4. Bruising and tenderness at the needle site are common side effects and will resolve in a few days.  5. Persistent numbness or new problems with movement should be communicated to the surgeon or the [facility] [phone] [place] [phone]).

## 2021-09-25 ENCOUNTER — Encounter (HOSPITAL_BASED_OUTPATIENT_CLINIC_OR_DEPARTMENT_OTHER): Payer: Self-pay | Admitting: Orthopaedic Surgery

## 2021-09-26 DIAGNOSIS — M19012 Primary osteoarthritis, left shoulder: Secondary | ICD-10-CM | POA: Diagnosis not present

## 2021-10-02 DIAGNOSIS — Z96612 Presence of left artificial shoulder joint: Secondary | ICD-10-CM | POA: Diagnosis not present

## 2021-10-02 DIAGNOSIS — M25612 Stiffness of left shoulder, not elsewhere classified: Secondary | ICD-10-CM | POA: Diagnosis not present

## 2021-10-02 DIAGNOSIS — S42392P Other fracture of shaft of left humerus, subsequent encounter for fracture with malunion: Secondary | ICD-10-CM | POA: Diagnosis not present

## 2021-10-02 DIAGNOSIS — M6281 Muscle weakness (generalized): Secondary | ICD-10-CM | POA: Diagnosis not present

## 2021-10-11 DIAGNOSIS — M25612 Stiffness of left shoulder, not elsewhere classified: Secondary | ICD-10-CM | POA: Diagnosis not present

## 2021-10-11 DIAGNOSIS — M6281 Muscle weakness (generalized): Secondary | ICD-10-CM | POA: Diagnosis not present

## 2021-10-11 DIAGNOSIS — Z96612 Presence of left artificial shoulder joint: Secondary | ICD-10-CM | POA: Diagnosis not present

## 2021-10-11 DIAGNOSIS — S42392P Other fracture of shaft of left humerus, subsequent encounter for fracture with malunion: Secondary | ICD-10-CM | POA: Diagnosis not present

## 2021-10-17 DIAGNOSIS — S42392D Other fracture of shaft of left humerus, subsequent encounter for fracture with routine healing: Secondary | ICD-10-CM | POA: Diagnosis not present

## 2021-10-18 DIAGNOSIS — S42392P Other fracture of shaft of left humerus, subsequent encounter for fracture with malunion: Secondary | ICD-10-CM | POA: Diagnosis not present

## 2021-10-18 DIAGNOSIS — M25612 Stiffness of left shoulder, not elsewhere classified: Secondary | ICD-10-CM | POA: Diagnosis not present

## 2021-10-18 DIAGNOSIS — M6281 Muscle weakness (generalized): Secondary | ICD-10-CM | POA: Diagnosis not present

## 2021-10-18 DIAGNOSIS — Z96612 Presence of left artificial shoulder joint: Secondary | ICD-10-CM | POA: Diagnosis not present

## 2021-10-20 ENCOUNTER — Ambulatory Visit (INDEPENDENT_AMBULATORY_CARE_PROVIDER_SITE_OTHER): Payer: Medicare PPO | Admitting: Internal Medicine

## 2021-10-20 VITALS — BP 120/70 | HR 73 | Temp 97.5°F | Ht 64.0 in | Wt 200.0 lb

## 2021-10-20 DIAGNOSIS — F411 Generalized anxiety disorder: Secondary | ICD-10-CM | POA: Diagnosis not present

## 2021-10-20 DIAGNOSIS — E7800 Pure hypercholesterolemia, unspecified: Secondary | ICD-10-CM

## 2021-10-20 DIAGNOSIS — Z0000 Encounter for general adult medical examination without abnormal findings: Secondary | ICD-10-CM

## 2021-10-20 DIAGNOSIS — I10 Essential (primary) hypertension: Secondary | ICD-10-CM

## 2021-10-20 DIAGNOSIS — E038 Other specified hypothyroidism: Secondary | ICD-10-CM | POA: Diagnosis not present

## 2021-10-20 DIAGNOSIS — Z23 Encounter for immunization: Secondary | ICD-10-CM

## 2021-10-20 DIAGNOSIS — R7302 Impaired glucose tolerance (oral): Secondary | ICD-10-CM

## 2021-10-20 LAB — CBC WITH DIFFERENTIAL/PLATELET
Basophils Absolute: 0.1 10*3/uL (ref 0.0–0.1)
Basophils Relative: 1 % (ref 0.0–3.0)
Eosinophils Absolute: 0.4 10*3/uL (ref 0.0–0.7)
Eosinophils Relative: 5.7 % — ABNORMAL HIGH (ref 0.0–5.0)
HCT: 36.8 % (ref 36.0–46.0)
Hemoglobin: 12.3 g/dL (ref 12.0–15.0)
Lymphocytes Relative: 26.3 % (ref 12.0–46.0)
Lymphs Abs: 2 10*3/uL (ref 0.7–4.0)
MCHC: 33.5 g/dL (ref 30.0–36.0)
MCV: 83.6 fl (ref 78.0–100.0)
Monocytes Absolute: 0.5 10*3/uL (ref 0.1–1.0)
Monocytes Relative: 7.1 % (ref 3.0–12.0)
Neutro Abs: 4.7 10*3/uL (ref 1.4–7.7)
Neutrophils Relative %: 59.9 % (ref 43.0–77.0)
Platelets: 254 10*3/uL (ref 150.0–400.0)
RBC: 4.4 Mil/uL (ref 3.87–5.11)
RDW: 13.7 % (ref 11.5–15.5)
WBC: 7.8 10*3/uL (ref 4.0–10.5)

## 2021-10-20 LAB — LIPID PANEL
Cholesterol: 170 mg/dL (ref 0–200)
HDL: 45.9 mg/dL (ref >39.00)
LDL Cholesterol: 97 mg/dL (ref 0–99)
NonHDL: 124.23
Total CHOL/HDL Ratio: 4
Triglycerides: 134 mg/dL (ref 0.0–149.0)
VLDL: 26.8 mg/dL (ref 0.0–40.0)

## 2021-10-20 LAB — COMPREHENSIVE METABOLIC PANEL
ALT: 15 U/L (ref 0–35)
AST: 17 U/L (ref 0–37)
Albumin: 4 g/dL (ref 3.5–5.2)
Alkaline Phosphatase: 87 U/L (ref 39–117)
BUN: 21 mg/dL (ref 6–23)
CO2: 27 mEq/L (ref 19–32)
Calcium: 9.5 mg/dL (ref 8.4–10.5)
Chloride: 103 mEq/L (ref 96–112)
Creatinine, Ser: 0.8 mg/dL (ref 0.40–1.20)
GFR: 75.11 mL/min (ref >60.00)
Glucose, Bld: 110 mg/dL — ABNORMAL HIGH (ref 70–99)
Potassium: 3.5 mEq/L (ref 3.5–5.1)
Sodium: 139 mEq/L (ref 135–145)
Total Bilirubin: 0.8 mg/dL (ref 0.2–1.2)
Total Protein: 6.9 g/dL (ref 6.0–8.3)

## 2021-10-20 LAB — HEMOGLOBIN A1C: Hgb A1c MFr Bld: 5.6 % (ref 4.6–6.5)

## 2021-10-20 LAB — VITAMIN D 25 HYDROXY (VIT D DEFICIENCY, FRACTURES): VITD: 43.99 ng/mL (ref 30.00–100.00)

## 2021-10-20 LAB — TSH: TSH: 0.93 u[IU]/mL (ref 0.35–5.50)

## 2021-10-20 LAB — VITAMIN B12: Vitamin B-12: 356 pg/mL (ref 211–911)

## 2021-10-20 NOTE — Addendum Note (Signed)
Addended by: Westley Hummer B on: 10/20/2021 09:08 AM   Modules accepted: Orders

## 2021-10-20 NOTE — Patient Instructions (Signed)
-  Nice seeing you today!!  -Lab work today; will notify you once results are available.  -Pneumonia vaccine today.  -Consider flu, COVID and shingles vaccines.  -Schedule follow up in 6 months.

## 2021-10-20 NOTE — Progress Notes (Signed)
Established Patient Office Visit     This visit occurred during the SARS-CoV-2 public health emergency.  Safety protocols were in place, including screening questions prior to the visit, additional usage of staff PPE, and extensive cleaning of exam room while observing appropriate contact time as indicated for disinfecting solutions.    CC/Reason for Visit: Annual preventive exam and subsequent Medicare wellness visit  HPI: Karina Allen is a 70 y.o. female who is coming in today for the above mentioned reasons. Past Medical History is significant for: Hypertension, hyperlipidemia, impaired glucose tolerance.  She had her left shoulder repair about a month ago and has been progressing well with physical therapy.  She has routine eye and dental care.  She is overdue for pneumonia, flu, COVID, shingles vaccinations.  She is overdue for colon, breast, cervical cancer screening as well as a bone density exam.  She is feeling well and has no acute concerns today.   Past Medical/Surgical History: Past Medical History:  Diagnosis Date   Arthritis    osteo   Depression    Epiretinal membrane 02/05/2017   Sees Dr. Valetta Close and Dr. Iona Hansen   Essential hypertension 04/27/2014   FH: migraines    GAD (generalized anxiety disorder) 08/04/2015   Hepatitis B    reports dx and tx as a young child, reports no issues since and retested as adult and told did not have hepatitis, she is unsure of type of hep she had   Thyroid disease     Past Surgical History:  Procedure Laterality Date   APPENDECTOMY     CATARACT EXTRACTION     CHOLECYSTECTOMY     ELBOW SURGERY     shattered elbow   endometrial oblation     FOOT SURGERY     RETINAL DETACHMENT SURGERY     REVERSE SHOULDER ARTHROPLASTY Left 09/20/2021   Procedure: LEFT REVERSE SHOULDER ARTHROPLASTY;  Surgeon: Hiram Gash, MD;  Location: [facility];  Service: Orthopedics;  Laterality: Left;   TONSILLECTOMY AND ADENOIDECTOMY      TUBAL LIGATION      Social History:  reports that she has never smoked. She has never used smokeless tobacco. She reports that she does not currently use alcohol. She reports that she does not use drugs.  Allergies: Allergies  Allergen Reactions   Penicillins    Penicillamine Hives    Family History:  Family History  Problem Relation Age of Onset   Prostate cancer Brother        94     Current Outpatient Medications:    aspirin (ASPIRIN CHILDRENS) 81 MG chewable tablet, Chew 1 tablet (81 mg total) by mouth 2 (two) times daily. For 6 weeks for DVT prophylaxis after surgery, Disp: 84 tablet, Rfl: 0   CALCIUM PO, Take by mouth., Disp: , Rfl:    hydrochlorothiazide (HYDRODIURIL) 25 MG tablet, Take 1 tablet (25 mg total) by mouth daily., Disp: 90 tablet, Rfl: 1   levothyroxine (SYNTHROID) 175 MCG tablet, Take 1 tablet (175 mcg total) by mouth daily with breakfast., Disp: 90 tablet, Rfl: 1   MAGNESIUM PO, Take by mouth., Disp: , Rfl:    NON FORMULARY, Doterra vitality pack, Disp: , Rfl:    olmesartan (BENICAR) 5 MG tablet, Take 2 tablets (10 mg total) by mouth daily., Disp: 180 tablet, Rfl: 1   vitamin C (ASCORBIC ACID) 500 MG tablet, Take by mouth daily., Disp: , Rfl:    VITAMIN D PO, Take by  mouth., Disp: , Rfl:    ZINC OXIDE PO, Take by mouth., Disp: , Rfl:    omeprazole (PRILOSEC) 20 MG capsule, Take 1 capsule (20 mg total) by mouth daily. To gastric protection while taking NSAIDs, Disp: 30 capsule, Rfl: 0  Review of Systems:  Constitutional: Denies fever, chills, diaphoresis, appetite change and fatigue.  HEENT: Denies photophobia, eye pain, redness, hearing loss, ear pain, congestion, sore throat, rhinorrhea, sneezing, mouth sores, trouble swallowing, neck pain, neck stiffness and tinnitus.   Respiratory: Denies SOB, DOE, cough, chest tightness,  and wheezing.   Cardiovascular: Denies chest pain, palpitations and leg swelling.  Gastrointestinal: Denies nausea, vomiting,  abdominal pain, diarrhea, constipation, blood in stool and abdominal distention.  Genitourinary: Denies dysuria, urgency, frequency, hematuria, flank pain and difficulty urinating.  Endocrine: Denies: hot or cold intolerance, sweats, changes in hair or nails, polyuria, polydipsia. Musculoskeletal: Denies myalgias, back pain,  and gait problem.  Skin: Denies pallor, rash and wound.  Neurological: Denies dizziness, seizures, syncope, weakness, light-headedness, numbness and headaches.  Hematological: Denies adenopathy. Easy bruising, personal or family bleeding history  Psychiatric/Behavioral: Denies suicidal ideation, mood changes, confusion, nervousness, sleep disturbance and agitation    Physical Exam: Vitals:   10/20/21 0812  BP: 120/70  Pulse: 73  Temp: (!) 97.5 F (36.4 C)  TempSrc: Oral  SpO2: 99%  Weight: 200 lb (90.7 kg)  Height: 5\' 4"  (1.626 m)    Body mass index is 34.33 kg/m.   Constitutional: NAD, calm, comfortable, left arm in a sling Eyes: Left pupil is chronically dilated and not responsive to light due to childhood trauma, lids and conjunctivae normal, wears corrective lenses ENMT: Mucous membranes are moist. Posterior pharynx clear of any exudate or lesions. Normal dentition. Tympanic membrane is pearly white, no erythema or bulging.  There is some earwax bilaterally Neck: normal, supple, no masses, no thyromegaly Respiratory: clear to auscultation bilaterally, no wheezing, no crackles. Normal respiratory effort. No accessory muscle use.  Cardiovascular: Regular rate and rhythm, no murmurs / rubs / gallops. No extremity edema. 2+ pedal pulses. No carotid bruits.  Abdomen: no tenderness, no masses palpated. No hepatosplenomegaly. Bowel sounds positive.  Musculoskeletal: no clubbing / cyanosis. No joint deformity upper and lower extremities. Good ROM, no contractures. Normal muscle tone.  Skin: no rashes, lesions, ulcers. No induration Neurologic: CN 2-12 grossly  intact. Sensation intact, DTR normal. Strength 5/5 in all 4.  Psychiatric: Normal judgment and insight. Alert and oriented x 3. Normal mood.    Subsequent Medicare wellness visit   1. Risk factors, based on past  M,S,F -cardiovascular disease risk factors include age, obesity, hypertension, hyperlipidemia   2.  Physical activities: She has been doing PT for her shoulder, but otherwise is sedentary   3.  Depression/mood: Stable, not depressed   4.  Hearing: No perceived issues   5.  ADL's: Independent in all ADLs   6.  Fall risk: Moderate fall risk   7.  Home safety: No problems identified   8.  Height weight, and visual acuity: height and weight as above, vision:  Vision Screening   Right eye Left eye Both eyes  Without correction     With correction 20/20 20/60 20/20      9.  Counseling: Advise she update vaccination status and cancer screenings   10. Lab orders based on risk factors: Laboratory update will be reviewed   11. Referral : None today   12. Care plan: Follow-up with me in 6 months   13.  Cognitive assessment: No cognitive impairment   14. Screening: Patient provided with a written and personalized 5-10 year screening schedule in the AVS. yes   15. Provider List Update: PCP, orthopedics  16. Advance Directives: Full code   17. Opioids: Patient is not on any opioid prescriptions and has no risk factors for a substance use disorder.   [facility] Office Visit from 10/20/2021 in [facility] at [facility]  PHQ-9 Total Score 0       Fall Risk 10/10/2017 10/15/2019 10/19/2020 09/20/2021 10/20/2021  Falls in the past year? No 1 0 - 1  Was there an injury with Fall? - 0 0 - 1  Fall Risk Category Calculator - 2 0 - 2  Fall Risk Category - Moderate Low - Moderate  Patient Fall Risk Level - - - Moderate fall risk Moderate fall risk  Patient at Risk for Falls Due to - - - - History of fall(s)  Fall risk Follow up - - - - Falls evaluation completed      Impression and Plan:  Encounter for preventive health examination -Recommend routine eye and dental care. -Immunizations: PCV 20 administered today, she is overdue for flu, COVID, shingles but declines all prescribed -Healthy lifestyle discussed in detail. -Labs to be updated today. -Colon cancer screening: Cologuard sent -Breast cancer screening: Overdue, declines despite counseling -Cervical cancer screening: Overdue, declines despite counseling -Lung cancer screening: Not applicable -Prostate cancer screening: Not applicable -DEXA: Overdue, declines despite counseling  Need for vaccination against Streptococcus pneumoniae -PCV 20 administered today.  IGT (impaired glucose tolerance)  - Plan: Hemoglobin A1c  Morbid obesity ([place]) -Discussed healthy lifestyle, including increased physical activity and better food choices to promote weight loss.  GAD (generalized anxiety disorder) -Mood is stable.  Essential hypertension  - Plan: CBC with Differential/Platelet, Comprehensive metabolic panel, Vitamin B84, VITAMIN D 25 Hydroxy (Vit-D Deficiency, Fractures) -Well-controlled on hydrochlorothiazide 25 mg and Benicar 10 mg.  Other specified hypothyroidism  - Plan: TSH Currently on 175 mcg of levothyroxine daily.  Pure hypercholesterolemia  - Plan: Lipid panel    Patient Instructions  -Nice seeing you today!!  -Lab work today; will notify you once results are available.  -Pneumonia vaccine today.  -Consider flu, COVID and shingles vaccines.  -Schedule follow up in 6 months.     Lelon Frohlich, MD [facility]

## 2021-10-23 DIAGNOSIS — M6281 Muscle weakness (generalized): Secondary | ICD-10-CM | POA: Diagnosis not present

## 2021-10-23 DIAGNOSIS — Z96612 Presence of left artificial shoulder joint: Secondary | ICD-10-CM | POA: Diagnosis not present

## 2021-10-23 DIAGNOSIS — S42392P Other fracture of shaft of left humerus, subsequent encounter for fracture with malunion: Secondary | ICD-10-CM | POA: Diagnosis not present

## 2021-10-23 DIAGNOSIS — M25612 Stiffness of left shoulder, not elsewhere classified: Secondary | ICD-10-CM | POA: Diagnosis not present

## 2021-11-01 DIAGNOSIS — M6281 Muscle weakness (generalized): Secondary | ICD-10-CM | POA: Diagnosis not present

## 2021-11-01 DIAGNOSIS — M25612 Stiffness of left shoulder, not elsewhere classified: Secondary | ICD-10-CM | POA: Diagnosis not present

## 2021-11-01 DIAGNOSIS — S42392P Other fracture of shaft of left humerus, subsequent encounter for fracture with malunion: Secondary | ICD-10-CM | POA: Diagnosis not present

## 2021-11-01 DIAGNOSIS — Z96612 Presence of left artificial shoulder joint: Secondary | ICD-10-CM | POA: Diagnosis not present

## 2021-11-08 DIAGNOSIS — M25612 Stiffness of left shoulder, not elsewhere classified: Secondary | ICD-10-CM | POA: Diagnosis not present

## 2021-11-08 DIAGNOSIS — S42392P Other fracture of shaft of left humerus, subsequent encounter for fracture with malunion: Secondary | ICD-10-CM | POA: Diagnosis not present

## 2021-11-08 DIAGNOSIS — Z96612 Presence of left artificial shoulder joint: Secondary | ICD-10-CM | POA: Diagnosis not present

## 2021-11-08 DIAGNOSIS — M6281 Muscle weakness (generalized): Secondary | ICD-10-CM | POA: Diagnosis not present

## 2021-11-15 DIAGNOSIS — M25612 Stiffness of left shoulder, not elsewhere classified: Secondary | ICD-10-CM | POA: Diagnosis not present

## 2021-11-15 DIAGNOSIS — S42392P Other fracture of shaft of left humerus, subsequent encounter for fracture with malunion: Secondary | ICD-10-CM | POA: Diagnosis not present

## 2021-11-15 DIAGNOSIS — Z96612 Presence of left artificial shoulder joint: Secondary | ICD-10-CM | POA: Diagnosis not present

## 2021-11-15 DIAGNOSIS — M6281 Muscle weakness (generalized): Secondary | ICD-10-CM | POA: Diagnosis not present

## 2021-11-22 DIAGNOSIS — S42392P Other fracture of shaft of left humerus, subsequent encounter for fracture with malunion: Secondary | ICD-10-CM | POA: Diagnosis not present

## 2021-11-22 DIAGNOSIS — M25612 Stiffness of left shoulder, not elsewhere classified: Secondary | ICD-10-CM | POA: Diagnosis not present

## 2021-11-22 DIAGNOSIS — Z96612 Presence of left artificial shoulder joint: Secondary | ICD-10-CM | POA: Diagnosis not present

## 2021-11-22 DIAGNOSIS — M6281 Muscle weakness (generalized): Secondary | ICD-10-CM | POA: Diagnosis not present

## 2021-11-29 DIAGNOSIS — Z96612 Presence of left artificial shoulder joint: Secondary | ICD-10-CM | POA: Diagnosis not present

## 2021-11-29 DIAGNOSIS — S42392P Other fracture of shaft of left humerus, subsequent encounter for fracture with malunion: Secondary | ICD-10-CM | POA: Diagnosis not present

## 2021-11-29 DIAGNOSIS — M25612 Stiffness of left shoulder, not elsewhere classified: Secondary | ICD-10-CM | POA: Diagnosis not present

## 2021-11-29 DIAGNOSIS — M6281 Muscle weakness (generalized): Secondary | ICD-10-CM | POA: Diagnosis not present

## 2021-12-04 DIAGNOSIS — M25612 Stiffness of left shoulder, not elsewhere classified: Secondary | ICD-10-CM | POA: Diagnosis not present

## 2021-12-04 DIAGNOSIS — Z96612 Presence of left artificial shoulder joint: Secondary | ICD-10-CM | POA: Diagnosis not present

## 2021-12-04 DIAGNOSIS — S42392P Other fracture of shaft of left humerus, subsequent encounter for fracture with malunion: Secondary | ICD-10-CM | POA: Diagnosis not present

## 2021-12-04 DIAGNOSIS — M6281 Muscle weakness (generalized): Secondary | ICD-10-CM | POA: Diagnosis not present

## 2021-12-12 DIAGNOSIS — S42392P Other fracture of shaft of left humerus, subsequent encounter for fracture with malunion: Secondary | ICD-10-CM | POA: Diagnosis not present

## 2021-12-14 DIAGNOSIS — M25612 Stiffness of left shoulder, not elsewhere classified: Secondary | ICD-10-CM | POA: Diagnosis not present

## 2021-12-14 DIAGNOSIS — S42392P Other fracture of shaft of left humerus, subsequent encounter for fracture with malunion: Secondary | ICD-10-CM | POA: Diagnosis not present

## 2021-12-14 DIAGNOSIS — Z96612 Presence of left artificial shoulder joint: Secondary | ICD-10-CM | POA: Diagnosis not present

## 2021-12-14 DIAGNOSIS — M6281 Muscle weakness (generalized): Secondary | ICD-10-CM | POA: Diagnosis not present

## 2021-12-20 DIAGNOSIS — M25612 Stiffness of left shoulder, not elsewhere classified: Secondary | ICD-10-CM | POA: Diagnosis not present

## 2021-12-20 DIAGNOSIS — S42392P Other fracture of shaft of left humerus, subsequent encounter for fracture with malunion: Secondary | ICD-10-CM | POA: Diagnosis not present

## 2021-12-20 DIAGNOSIS — Z96612 Presence of left artificial shoulder joint: Secondary | ICD-10-CM | POA: Diagnosis not present

## 2021-12-20 DIAGNOSIS — M6281 Muscle weakness (generalized): Secondary | ICD-10-CM | POA: Diagnosis not present

## 2021-12-27 DIAGNOSIS — M6281 Muscle weakness (generalized): Secondary | ICD-10-CM | POA: Diagnosis not present

## 2021-12-27 DIAGNOSIS — M25612 Stiffness of left shoulder, not elsewhere classified: Secondary | ICD-10-CM | POA: Diagnosis not present

## 2021-12-27 DIAGNOSIS — Z96612 Presence of left artificial shoulder joint: Secondary | ICD-10-CM | POA: Diagnosis not present

## 2021-12-27 DIAGNOSIS — S42392P Other fracture of shaft of left humerus, subsequent encounter for fracture with malunion: Secondary | ICD-10-CM | POA: Diagnosis not present

## 2022-01-03 DIAGNOSIS — Z23 Encounter for immunization: Secondary | ICD-10-CM | POA: Diagnosis not present

## 2022-01-03 DIAGNOSIS — M6281 Muscle weakness (generalized): Secondary | ICD-10-CM | POA: Diagnosis not present

## 2022-01-03 DIAGNOSIS — S42392P Other fracture of shaft of left humerus, subsequent encounter for fracture with malunion: Secondary | ICD-10-CM | POA: Diagnosis not present

## 2022-01-03 DIAGNOSIS — Z96612 Presence of left artificial shoulder joint: Secondary | ICD-10-CM | POA: Diagnosis not present

## 2022-01-03 DIAGNOSIS — L821 Other seborrheic keratosis: Secondary | ICD-10-CM | POA: Diagnosis not present

## 2022-01-03 DIAGNOSIS — M25612 Stiffness of left shoulder, not elsewhere classified: Secondary | ICD-10-CM | POA: Diagnosis not present

## 2022-01-10 ENCOUNTER — Other Ambulatory Visit: Payer: Self-pay | Admitting: Internal Medicine

## 2022-01-10 DIAGNOSIS — M6281 Muscle weakness (generalized): Secondary | ICD-10-CM | POA: Diagnosis not present

## 2022-01-10 DIAGNOSIS — M25612 Stiffness of left shoulder, not elsewhere classified: Secondary | ICD-10-CM | POA: Diagnosis not present

## 2022-01-10 DIAGNOSIS — Z1231 Encounter for screening mammogram for malignant neoplasm of breast: Secondary | ICD-10-CM

## 2022-01-10 DIAGNOSIS — S42392P Other fracture of shaft of left humerus, subsequent encounter for fracture with malunion: Secondary | ICD-10-CM | POA: Diagnosis not present

## 2022-01-10 DIAGNOSIS — Z96612 Presence of left artificial shoulder joint: Secondary | ICD-10-CM | POA: Diagnosis not present

## 2022-01-17 DIAGNOSIS — M25612 Stiffness of left shoulder, not elsewhere classified: Secondary | ICD-10-CM | POA: Diagnosis not present

## 2022-01-17 DIAGNOSIS — S42392P Other fracture of shaft of left humerus, subsequent encounter for fracture with malunion: Secondary | ICD-10-CM | POA: Diagnosis not present

## 2022-01-17 DIAGNOSIS — M6281 Muscle weakness (generalized): Secondary | ICD-10-CM | POA: Diagnosis not present

## 2022-01-17 DIAGNOSIS — Z96612 Presence of left artificial shoulder joint: Secondary | ICD-10-CM | POA: Diagnosis not present

## 2022-01-24 DIAGNOSIS — M25612 Stiffness of left shoulder, not elsewhere classified: Secondary | ICD-10-CM | POA: Diagnosis not present

## 2022-01-24 DIAGNOSIS — S42392P Other fracture of shaft of left humerus, subsequent encounter for fracture with malunion: Secondary | ICD-10-CM | POA: Diagnosis not present

## 2022-01-24 DIAGNOSIS — M6281 Muscle weakness (generalized): Secondary | ICD-10-CM | POA: Diagnosis not present

## 2022-01-24 DIAGNOSIS — Z96612 Presence of left artificial shoulder joint: Secondary | ICD-10-CM | POA: Diagnosis not present

## 2022-01-26 DIAGNOSIS — Z961 Presence of intraocular lens: Secondary | ICD-10-CM | POA: Diagnosis not present

## 2022-01-26 DIAGNOSIS — H31002 Unspecified chorioretinal scars, left eye: Secondary | ICD-10-CM | POA: Diagnosis not present

## 2022-01-26 DIAGNOSIS — H5212 Myopia, left eye: Secondary | ICD-10-CM | POA: Diagnosis not present

## 2022-01-31 DIAGNOSIS — M25612 Stiffness of left shoulder, not elsewhere classified: Secondary | ICD-10-CM | POA: Diagnosis not present

## 2022-01-31 DIAGNOSIS — S42392P Other fracture of shaft of left humerus, subsequent encounter for fracture with malunion: Secondary | ICD-10-CM | POA: Diagnosis not present

## 2022-01-31 DIAGNOSIS — Z96612 Presence of left artificial shoulder joint: Secondary | ICD-10-CM | POA: Diagnosis not present

## 2022-01-31 DIAGNOSIS — M6281 Muscle weakness (generalized): Secondary | ICD-10-CM | POA: Diagnosis not present

## 2022-02-05 DIAGNOSIS — M6281 Muscle weakness (generalized): Secondary | ICD-10-CM | POA: Diagnosis not present

## 2022-02-05 DIAGNOSIS — Z96612 Presence of left artificial shoulder joint: Secondary | ICD-10-CM | POA: Diagnosis not present

## 2022-02-05 DIAGNOSIS — M25612 Stiffness of left shoulder, not elsewhere classified: Secondary | ICD-10-CM | POA: Diagnosis not present

## 2022-02-05 DIAGNOSIS — S42392P Other fracture of shaft of left humerus, subsequent encounter for fracture with malunion: Secondary | ICD-10-CM | POA: Diagnosis not present

## 2022-02-07 ENCOUNTER — Ambulatory Visit
Admission: RE | Admit: 2022-02-07 | Discharge: 2022-02-07 | Disposition: A | Discharge status: Home / Self Care | Payer: Medicare PPO | Source: Ambulatory Visit | Attending: Internal Medicine | Admitting: Internal Medicine

## 2022-02-07 DIAGNOSIS — Z1231 Encounter for screening mammogram for malignant neoplasm of breast: Secondary | ICD-10-CM

## 2022-02-08 ENCOUNTER — Other Ambulatory Visit: Payer: Self-pay | Admitting: Internal Medicine

## 2022-02-08 DIAGNOSIS — R928 Other abnormal and inconclusive findings on diagnostic imaging of breast: Secondary | ICD-10-CM

## 2022-02-27 ENCOUNTER — Ambulatory Visit
Admission: RE | Admit: 2022-02-27 | Discharge: 2022-02-27 | Disposition: A | Discharge status: Home / Self Care | Payer: Medicare PPO | Source: Ambulatory Visit | Attending: Internal Medicine | Admitting: Internal Medicine

## 2022-02-27 ENCOUNTER — Other Ambulatory Visit: Payer: Self-pay | Admitting: Internal Medicine

## 2022-02-27 DIAGNOSIS — R922 Inconclusive mammogram: Secondary | ICD-10-CM | POA: Diagnosis not present

## 2022-02-27 DIAGNOSIS — R920 Mammographic microcalcification found on diagnostic imaging of breast: Secondary | ICD-10-CM | POA: Diagnosis not present

## 2022-02-27 DIAGNOSIS — R921 Mammographic calcification found on diagnostic imaging of breast: Secondary | ICD-10-CM | POA: Diagnosis not present

## 2022-02-27 DIAGNOSIS — R928 Other abnormal and inconclusive findings on diagnostic imaging of breast: Secondary | ICD-10-CM

## 2022-02-28 DIAGNOSIS — S42392P Other fracture of shaft of left humerus, subsequent encounter for fracture with malunion: Secondary | ICD-10-CM | POA: Diagnosis not present

## 2022-02-28 DIAGNOSIS — M6281 Muscle weakness (generalized): Secondary | ICD-10-CM | POA: Diagnosis not present

## 2022-02-28 DIAGNOSIS — Z96612 Presence of left artificial shoulder joint: Secondary | ICD-10-CM | POA: Diagnosis not present

## 2022-02-28 DIAGNOSIS — M25612 Stiffness of left shoulder, not elsewhere classified: Secondary | ICD-10-CM | POA: Diagnosis not present

## 2022-03-05 ENCOUNTER — Ambulatory Visit
Admission: RE | Admit: 2022-03-05 | Discharge: 2022-03-05 | Disposition: A | Discharge status: Home / Self Care | Payer: Medicare PPO | Source: Ambulatory Visit | Attending: Internal Medicine | Admitting: Internal Medicine

## 2022-03-05 DIAGNOSIS — R921 Mammographic calcification found on diagnostic imaging of breast: Secondary | ICD-10-CM

## 2022-03-05 DIAGNOSIS — D0512 Intraductal carcinoma in situ of left breast: Secondary | ICD-10-CM | POA: Diagnosis not present

## 2022-03-12 ENCOUNTER — Other Ambulatory Visit: Payer: Self-pay | Admitting: *Deleted

## 2022-03-12 ENCOUNTER — Ambulatory Visit: Payer: Self-pay | Admitting: Surgery

## 2022-03-12 ENCOUNTER — Other Ambulatory Visit: Payer: Self-pay | Admitting: Surgery

## 2022-03-12 DIAGNOSIS — Z170 Estrogen receptor positive status [ER+]: Secondary | ICD-10-CM | POA: Insufficient documentation

## 2022-03-12 DIAGNOSIS — D0512 Intraductal carcinoma in situ of left breast: Secondary | ICD-10-CM

## 2022-03-12 DIAGNOSIS — C50512 Malignant neoplasm of lower-outer quadrant of left female breast: Secondary | ICD-10-CM | POA: Insufficient documentation

## 2022-03-12 NOTE — H&P (View-Only) (Signed)
?Subjective  ?  ?Chief Complaint: Breast Cancer ?  ?  ?  ?History of Present Illness: ?Karina Allen is a 70 y.o. female who is seen today as an office consultation at the request of Dr. Hernandez Acosta for evaluation of Breast Cancer ?.   ?This is a 70-year-old female in relatively good health with no family history of breast cancer who presents after a recent screening mammogram.  This revealed a 1.0 x 0.4 x 0.8 cm area of calcifications in the left lower outer breast.  She underwent stereotactic biopsy of this area which revealed ductal carcinoma in situ grade 2, ER/PR positive, Ki-67 2%.  There was no sign of invasive cancer.  She presents now for her initial surgical consultation. ?  ?  ?Review of Systems: ?A complete review of systems was obtained from the patient.  I have reviewed this information and discussed as appropriate with the patient.  See HPI as well for other ROS. ?  ?Review of Systems  ?Constitutional: Negative.   ?HENT: Negative.   ?Eyes: Negative.   ?Respiratory: Negative.   ?Cardiovascular: Negative.   ?Gastrointestinal: Negative.   ?Genitourinary: Negative.   ?Musculoskeletal: Negative.   ?Skin: Negative.   ?Neurological: Negative.   ?Endo/Heme/Allergies: Negative.   ?Psychiatric/Behavioral: Negative.   ?  ?  ?  ?Medical History: ?Past Medical History  ?    ?Past Medical History:  ?Diagnosis Date  ? Anxiety    ? Hypertension    ? Liver disease    ? Thyroid disease    ?  ?  ?  ?   ?Patient Active Problem List  ?Diagnosis  ? Essential hypertension  ? GAD (generalized anxiety disorder)  ? Hyperglycemia  ? Hypothyroidism  ? Morbid obesity (CMS-HCC)  ? Neoplasm of left breast, primary tumor staging category Tis: ductal carcinoma in situ (DCIS)  ?  ?  ?Past Surgical History  ?     ?Past Surgical History:  ?Procedure Laterality Date  ? TONSILLECTOMY   1966  ? CHOLECYSTECTOMY OPEN   2011  ? detatched retina   12/2010  ? TOTAL SHOULDER REPLACEMENT   09/2021  ? APPENDECTOMY      ? elbow injury      ?  ?   ?  ?Allergies  ?    ?Allergies  ?Allergen Reactions  ? Penicillins Unknown  ?  ?  ?  ?No current outpatient medications on file prior to visit.  ?  ?No current facility-administered medications on file prior to visit.  ?  ?  ?Family History  ?     ?Family History  ?Problem Relation Age of Onset  ? High blood pressure (Hypertension) Mother    ? Hyperlipidemia (Elevated cholesterol) Mother    ? Diabetes Sister    ? Obesity Sister    ? High blood pressure (Hypertension) Sister    ? Heart valve disease Brother    ? Diabetes Brother    ?  ?  ?  ?Social History  ?  ?   ?Tobacco Use  ?Smoking Status Never  ?Smokeless Tobacco Never  ?  ?  ?Social History  ?Social History  ?  ?    ?Socioeconomic History  ? Marital status: Widowed  ?Tobacco Use  ? Smoking status: Never  ? Smokeless tobacco: Never  ?Substance and Sexual Activity  ? Alcohol use: Never  ? Drug use: Never  ?  ?  ?  ?Objective:  ?  ?  ?   ?Vitals:  ?    03/12/22 1051  ?BP: 120/70  ?Pulse: 91  ?Temp: 36.3 ?C (97.3 ?F)  ?SpO2: 98%  ?Weight: 95.3 kg (210 lb)  ?Height: 162.6 cm (5' 4")  ?  ?Body mass index is 36.05 kg/m?. ?  ?Physical Exam  ?  ?Constitutional:  WDWN in NAD, conversant, no obvious deformities; lying in bed comfortably ?Eyes:  Pupils equal, round; sclera anicteric; moist conjunctiva; no lid lag ?HENT:  Oral mucosa moist; good dentition  ?Neck:  No masses palpated, trachea midline; no thyromegaly ?Lungs:  CTA bilaterally; normal respiratory effort ?Breasts:  symmetric, no nipple changes; small hematoma and ecchymosis left lower outer quadrant; no other palpable masses or lymphadenopathy on either side ?CV:  Regular rate and rhythm; no murmurs; extremities well-perfused with no edema ?Abd:  +bowel sounds, soft, non-tender, no palpable organomegaly; no palpable hernias ?Musc:  Unable to assess gait; no apparent clubbing or cyanosis in extremities ?Lymphatic:  No palpable cervical or axillary lymphadenopathy ?Skin:  Warm, dry; no sign of  jaundice ?Psychiatric - alert and oriented x 4; calm mood and affect ?  ?  ?Labs, Imaging and Diagnostic Testing: ?CLINICAL DATA:  Callback from screening mammogram for possible ?calcifications left breast ?  ?EXAM: ?DIGITAL DIAGNOSTIC UNILATERAL LEFT MAMMOGRAM WITH CAD ?  ?TECHNIQUE: ?Left digital diagnostic mammography was performed. Mammographic ?images were processed with CAD. ?  ?COMPARISON:  Prior films ?  ?ACR Breast Density Category b: There are scattered areas of ?fibroglandular density. ?  ?FINDINGS: ?Lateral view of left breast, spot magnification cc and lateral views ?of the left submitted. There is a 1 x 0.4 x 0.8 cm group of ?indeterminate microcalcifications in the lateral lower left breast. ?  ?IMPRESSION: ?Suspicious findings. ?  ?RECOMMENDATION: ?Stereotactic core biopsy of left breast calcifications. ?  ?I have discussed the findings and recommendations with the patient. ?If applicable, a reminder letter will be sent to the patient ?regarding the next appointment. ?  ?BI-RADS CATEGORY  4: Suspicious. ?  ?  ?Electronically Signed ?  By: Wei-Chen  Lin M.D. ?  On: 02/27/2022 08:39 ?  ?Assessment and Plan:  ?Diagnoses and all orders for this visit: ?  ?Neoplasm of left breast, primary tumor staging category Tis: ductal carcinoma in situ (DCIS) ?-     Ambulatory Referral to Oncology-Medical ?-     Ambulatory Referral to Radiation Oncology ?  ?  ?We spent some time discussing her disease and the expected treatment recommendations.  She has a relatively small area of calcifications.  We discussed the fact that she has ductal carcinoma but the cancer cells are still confined.  There is no risk of this type of cancer spreading to the lymph nodes.  We recommended lumpectomy for clear margins.  We discussed the procedure of a left radioactive seed localized lumpectomy. ?  ?The surgical procedure has been discussed with the patient.  Potential risks, benefits, alternative treatments, and expected outcomes  have been explained.  All of the patient's questions at this time have been answered.  The likelihood of reaching the patient's treatment goal is good.  The patient understand the proposed surgical procedure and wishes to proceed. ?  ?We discussed the possibility of positive margins and the possibility of finding invasive disease.  If she does have invasive disease, she would need a sentinel lymph node biopsy.  We briefly discussed that procedure as well.  I answered all of her questions today.  I encouraged her to bring a friend or family member to her future visits ?  ?  ?  No follow-ups on file. ?  ?Mahamud Metts KAI Ja Ohman, MD  ?03/12/2022 ?11:39 AM ?  ?

## 2022-03-12 NOTE — H&P (Signed)
?Subjective  ?  ?Chief Complaint: Breast Cancer ?  ?  ?  ?History of Present Illness: ?Karina Allen is a 71 y.o. female who is seen today as an office consultation at the request of Dr. Isaac Bliss for evaluation of Breast Cancer ?Marland Kitchen   ?This is a 71 year old female in relatively good health with no family history of breast cancer who presents after a recent screening mammogram.  This revealed a 1.0 x 0.4 x 0.8 cm area of calcifications in the left lower outer breast.  She underwent stereotactic biopsy of this area which revealed ductal carcinoma in situ grade 2, ER/PR positive, Ki-67 2%.  There was no sign of invasive cancer.  She presents now for her initial surgical consultation. ?  ?  ?Review of Systems: ?A complete review of systems was obtained from the patient.  I have reviewed this information and discussed as appropriate with the patient.  See HPI as well for other ROS. ?  ?Review of Systems  ?Constitutional: Negative.   ?HENT: Negative.   ?Eyes: Negative.   ?Respiratory: Negative.   ?Cardiovascular: Negative.   ?Gastrointestinal: Negative.   ?Genitourinary: Negative.   ?Musculoskeletal: Negative.   ?Skin: Negative.   ?Neurological: Negative.   ?Endo/Heme/Allergies: Negative.   ?Psychiatric/Behavioral: Negative.   ?  ?  ?  ?Medical History: ?Past Medical History  ?    ?Past Medical History:  ?Diagnosis Date  ? Anxiety    ? Hypertension    ? Liver disease    ? Thyroid disease    ?  ?  ?  ?   ?Patient Active Problem List  ?Diagnosis  ? Essential hypertension  ? GAD (generalized anxiety disorder)  ? Hyperglycemia  ? Hypothyroidism  ? Morbid obesity (CMS-HCC)  ? Neoplasm of left breast, primary tumor staging category Tis: ductal carcinoma in situ (DCIS)  ?  ?  ?Past Surgical History  ?     ?Past Surgical History:  ?Procedure Laterality Date  ? TONSILLECTOMY   1966  ? CHOLECYSTECTOMY OPEN   2011  ? detatched retina   12/2010  ? TOTAL SHOULDER REPLACEMENT   09/2021  ? APPENDECTOMY      ? elbow injury      ?  ?   ?  ?Allergies  ?    ?Allergies  ?Allergen Reactions  ? Penicillins Unknown  ?  ?  ?  ?No current outpatient medications on file prior to visit.  ?  ?No current facility-administered medications on file prior to visit.  ?  ?  ?Family History  ?     ?Family History  ?Problem Relation Age of Onset  ? High blood pressure (Hypertension) Mother    ? Hyperlipidemia (Elevated cholesterol) Mother    ? Diabetes Sister    ? Obesity Sister    ? High blood pressure (Hypertension) Sister    ? Heart valve disease Brother    ? Diabetes Brother    ?  ?  ?  ?Social History  ?  ?   ?Tobacco Use  ?Smoking Status Never  ?Smokeless Tobacco Never  ?  ?  ?Social History  ?Social History  ?  ?    ?Socioeconomic History  ? Marital status: Widowed  ?Tobacco Use  ? Smoking status: Never  ? Smokeless tobacco: Never  ?Substance and Sexual Activity  ? Alcohol use: Never  ? Drug use: Never  ?  ?  ?  ?Objective:  ?  ?  ?   ?Vitals:  ?  03/12/22 1051  ?BP: 120/70  ?Pulse: 91  ?Temp: 36.3 ?C (97.3 ?F)  ?SpO2: 98%  ?Weight: 95.3 kg (210 lb)  ?Height: 162.6 cm (_0 )  ?  ?Body mass index is 36.05 kg/m?. ?  ?Physical Exam  ?  ?Constitutional:  WDWN in NAD, conversant, no obvious deformities; lying in bed comfortably ?Eyes:  Pupils equal, round; sclera anicteric; moist conjunctiva; no lid lag ?HENT:  Oral mucosa moist; good dentition  ?Neck:  No masses palpated, trachea midline; no thyromegaly ?Lungs:  CTA bilaterally; normal respiratory effort ?Breasts:  symmetric, no nipple changes; small hematoma and ecchymosis left lower outer quadrant; no other palpable masses or lymphadenopathy on either side ?CV:  Regular rate and rhythm; no murmurs; extremities well-perfused with no edema ?Abd:  +bowel sounds, soft, non-tender, no palpable organomegaly; no palpable hernias ?Musc:  Unable to assess gait; no apparent clubbing or cyanosis in extremities ?Lymphatic:  No palpable cervical or axillary lymphadenopathy ?Skin:  Warm, dry; no sign of  jaundice ?Psychiatric - alert and oriented x 4; calm mood and affect ?  ?  ?Labs, Imaging and Diagnostic Testing: ?CLINICAL DATA:  Callback from screening mammogram for possible ?calcifications left breast ?  ?EXAM: ?DIGITAL DIAGNOSTIC UNILATERAL LEFT MAMMOGRAM WITH CAD ?  ?TECHNIQUE: ?Left digital diagnostic mammography was performed. Mammographic ?images were processed with CAD. ?  ?COMPARISON:  Prior films ?  ?ACR Breast Density Category b: There are scattered areas of ?fibroglandular density. ?  ?FINDINGS: ?Lateral view of left breast, spot magnification cc and lateral views ?of the left submitted. There is a 1 x 0.4 x 0.8 cm group of ?indeterminate microcalcifications in the lateral lower left breast. ?  ?IMPRESSION: ?Suspicious findings. ?  ?RECOMMENDATION: ?Stereotactic core biopsy of left breast calcifications. ?  ?I have discussed the findings and recommendations with the patient. ?If applicable, a reminder letter will be sent to the patient ?regarding the next appointment. ?  ?BI-RADS CATEGORY  4: Suspicious. ?  ?  ?Electronically Signed ?  By: Abelardo Diesel M.D. ?  On: 02/27/2022 08:39 ?  ?Assessment and Plan:  ?Diagnoses and all orders for this visit: ?  ?Neoplasm of left breast, primary tumor staging category Tis: ductal carcinoma in situ (DCIS) ?-     Ambulatory Referral to Oncology-Medical ?-     Ambulatory Referral to Radiation Oncology ?  ?  ?We spent some time discussing her disease and the expected treatment recommendations.  She has a relatively small area of calcifications.  We discussed the fact that she has ductal carcinoma but the cancer cells are still confined.  There is no risk of this type of cancer spreading to the lymph nodes.  We recommended lumpectomy for clear margins.  We discussed the procedure of a left radioactive seed localized lumpectomy. ?  ?The surgical procedure has been discussed with the patient.  Potential risks, benefits, alternative treatments, and expected outcomes  have been explained.  All of the patient's questions at this time have been answered.  The likelihood of reaching the patient's treatment goal is good.  The patient understand the proposed surgical procedure and wishes to proceed. ?  ?We discussed the possibility of positive margins and the possibility of finding invasive disease.  If she does have invasive disease, she would need a sentinel lymph node biopsy.  We briefly discussed that procedure as well.  I answered all of her questions today.  I encouraged her to bring a friend or family member to her future visits ?  ?  ?  No follow-ups on file. ?  ?Dearia Wilmouth Jearld Adjutant, MD  ?03/12/2022 ?11:39 AM ?  ?

## 2022-03-13 ENCOUNTER — Telehealth: Payer: Self-pay | Admitting: Hematology and Oncology

## 2022-03-13 NOTE — Telephone Encounter (Signed)
Scheduled appt per 5/8 referral. Pt requested appt with med onc to be on the same day as radiation. Pt is aware of appt date and time. Pt is aware to arrive 15 mins prior to appt time and to bring and updated insurance card. Pt is aware of appt location.   ?

## 2022-03-15 NOTE — Pre-Procedure Instructions (Signed)
Surgical Instructions ? ? ? Your procedure is scheduled on Tuesday, May 16th. ? Report to [facility] Main Entrance "A" at 7:00 A.M., then check in with the Admitting office. ? Call this number if you have problems the morning of surgery: ? [phone] ? ? If you have any questions prior to your surgery date call [phone]: Open Monday-Friday 8am-4pm ? ? ? Remember: ? Do not eat after midnight the night before your surgery ? ?You may drink clear liquids until 6:00 a.m. the morning of your surgery.   ?Clear liquids allowed are: Water, Non-Citrus Juices (without pulp), Carbonated Beverages, Clear Tea, Black Coffee Only (NO MILK, CREAM OR POWDERED CREAMER of any kind), and Gatorade. ?  ? Take these medicines the morning of surgery with A SIP OF WATER  ?levothyroxine (SYNTHROID) ?Eye drops-if needed ? ?As of today, STOP taking any Aspirin (unless otherwise instructed by your surgeon) Aleve, Naproxen, Ibuprofen, Motrin, Advil, Goody's, BC's, all herbal medications, fish oil, and all vitamins. ?         ?           ?Do NOT Smoke (Tobacco/Vaping) for 24 hours prior to your procedure. ? ?If you use a CPAP at night, you may bring your mask/headgear for your overnight stay. ?  ?Contacts, glasses, piercing's, hearing aid's, dentures or partials may not be worn into surgery, please bring cases for these belongings.  ?  ?For patients admitted to the hospital, discharge time will be determined by your treatment team. ?  ?Patients discharged the day of surgery will not be allowed to drive home, and someone needs to stay with them for 24 hours. ? ?SURGICAL WAITING ROOM VISITATION ?Patients having surgery or a procedure may have two support people in the waiting room. These visitors may be switched out with other visitors if needed. ?Children under the age of 55 must have an adult accompany them who is not the patient. ?If the patient needs to stay at the hospital during part of their recovery, the visitor guidelines for  inpatient rooms apply. ? ?Please refer to the [place] website for the visitor guidelines for Inpatients (after your surgery is over and you are in a regular room).  ? ? ?Special instructions:   ?[facility]- Preparing For Surgery ? ?Before surgery, you can play an important role. Because skin is not sterile, your skin needs to be as free of germs as possible. You can reduce the number of germs on your skin by washing with CHG (chlorahexidine gluconate) Soap before surgery.  CHG is an antiseptic cleaner which kills germs and bonds with the skin to continue killing germs even after washing.   ? ?Oral Hygiene is also important to reduce your risk of infection.  Remember - BRUSH YOUR TEETH THE MORNING OF SURGERY WITH YOUR REGULAR TOOTHPASTE ? ?Please do not use if you have an allergy to CHG or antibacterial soaps. If your skin becomes reddened/irritated stop using the CHG.  ?Do not shave (including legs and underarms) for at least 48 hours prior to first CHG shower. It is OK to shave your face. ? ?Please follow these instructions carefully. ?  ?Shower the NIGHT BEFORE SURGERY and the MORNING OF SURGERY ? ?If you chose to wash your hair, wash your hair first as usual with your normal shampoo. ? ?After you shampoo, rinse your hair and body thoroughly to remove the shampoo. ? ?Use CHG Soap as you would any other liquid soap. You can apply CHG directly to the  skin and wash gently with a scrungie or a clean washcloth.  ? ?Apply the CHG Soap to your body ONLY FROM THE NECK DOWN.  Do not use on open wounds or open sores. Avoid contact with your eyes, ears, mouth and genitals (private parts). Wash Face and genitals (private parts)  with your normal soap.  ? ?Wash thoroughly, paying special attention to the area where your surgery will be performed. ? ?Thoroughly rinse your body with warm water from the neck down. ? ?DO NOT shower/wash with your normal soap after using and rinsing off the CHG Soap. ? ?Pat yourself dry with a  CLEAN TOWEL. ? ?Wear CLEAN PAJAMAS to bed the night before surgery ? ?Place CLEAN SHEETS on your bed the night before your surgery ? ?DO NOT SLEEP WITH PETS. ? ? ?Day of Surgery: ?Take a shower with CHG soap. ?Do not wear jewelry or makeup ?Do not wear lotions, powders, perfumes, or deodorant. ?Do not shave 48 hours prior to surgery.  ?Do not bring valuables to the hospital.  ?[facility] is not responsible for any belongings or valuables. ?Do not wear nail polish, gel polish, artificial nails, or any other type of covering on natural nails (fingers and toes) ?If you have artificial nails or gel coating that need to be removed by a nail salon, please have this removed prior to surgery. Artificial nails or gel coating may interfere with anesthesia's ability to adequately monitor your vital signs. ?Wear Clean/Comfortable clothing the morning of surgery ?Remember to brush your teeth WITH YOUR REGULAR TOOTHPASTE. ?  ?Please read over the following fact sheets that you were given. ? ? ? ?If you received a COVID test during your pre-op visit  it is requested that you wear a mask when out in public, stay away from anyone that may not be feeling well and notify your surgeon if you develop symptoms. If you have been in contact with anyone that has tested positive in the last 10 days please notify you surgeon. ? ?

## 2022-03-16 ENCOUNTER — Other Ambulatory Visit: Payer: Self-pay

## 2022-03-16 ENCOUNTER — Encounter (HOSPITAL_COMMUNITY): Payer: Self-pay

## 2022-03-16 ENCOUNTER — Encounter (HOSPITAL_COMMUNITY)
Admission: RE | Admit: 2022-03-16 | Discharge: 2022-03-16 | Disposition: A | Discharge status: Home / Self Care | Payer: Medicare PPO | Source: Ambulatory Visit | Attending: Surgery | Admitting: Surgery

## 2022-03-16 VITALS — BP 139/86 | HR 67 | Temp 97.5°F | Resp 17 | Ht 64.0 in | Wt 215.4 lb

## 2022-03-16 DIAGNOSIS — Z01812 Encounter for preprocedural laboratory examination: Secondary | ICD-10-CM | POA: Insufficient documentation

## 2022-03-16 DIAGNOSIS — Z01818 Encounter for other preprocedural examination: Secondary | ICD-10-CM

## 2022-03-16 DIAGNOSIS — Z79899 Other long term (current) drug therapy: Secondary | ICD-10-CM | POA: Diagnosis not present

## 2022-03-16 LAB — COMPREHENSIVE METABOLIC PANEL
ALT: 19 U/L (ref 0–44)
AST: 20 U/L (ref 15–41)
Albumin: 3.8 g/dL (ref 3.5–5.0)
Alkaline Phosphatase: 77 U/L (ref 38–126)
Anion gap: 6 (ref 5–15)
BUN: 15 mg/dL (ref 8–23)
CO2: 27 mmol/L (ref 22–32)
Calcium: 9.4 mg/dL (ref 8.9–10.3)
Chloride: 105 mmol/L (ref 98–111)
Creatinine, Ser: 0.84 mg/dL (ref 0.44–1.00)
GFR, Estimated: >60 mL/min (ref >60)
Glucose, Bld: 117 mg/dL — ABNORMAL HIGH (ref 70–99)
Potassium: 3.5 mmol/L (ref 3.5–5.1)
Sodium: 138 mmol/L (ref 135–145)
Total Bilirubin: 0.8 mg/dL (ref 0.3–1.2)
Total Protein: 6.8 g/dL (ref 6.5–8.1)

## 2022-03-16 LAB — CBC
HCT: 36.9 % (ref 36.0–46.0)
Hemoglobin: 12.5 g/dL (ref 12.0–15.0)
MCH: 29.1 pg (ref 26.0–34.0)
MCHC: 33.9 g/dL (ref 30.0–36.0)
MCV: 86 fL (ref 80.0–100.0)
Platelets: 289 10*3/uL (ref 150–400)
RBC: 4.29 MIL/uL (ref 3.87–5.11)
RDW: 12.7 % (ref 11.5–15.5)
WBC: 9.7 10*3/uL (ref 4.0–10.5)
nRBC: 0 % (ref 0.0–0.2)

## 2022-03-16 NOTE — Progress Notes (Signed)
PCP: Dr. Jerilee Hoh ?Cardiologist: denies ? ?EKG: 09/15/21 ?CXR: n/a ?ECHO: denies ?Stress Test: denies ?Cardiac Cath: denies ? ?ERAS: clears ? ?Patient denies shortness of breath, fever, cough, and chest pain at PAT appointment. ? ?Patient verbalized understanding of instructions provided today at the PAT appointment.  Patient asked to review instructions at home and day of surgery.  ? ?

## 2022-03-19 ENCOUNTER — Ambulatory Visit
Admission: RE | Admit: 2022-03-19 | Discharge: 2022-03-19 | Disposition: A | Discharge status: Home / Self Care | Payer: Medicare PPO | Source: Ambulatory Visit | Attending: Surgery | Admitting: Surgery

## 2022-03-19 DIAGNOSIS — D0512 Intraductal carcinoma in situ of left breast: Secondary | ICD-10-CM

## 2022-03-19 DIAGNOSIS — R928 Other abnormal and inconclusive findings on diagnostic imaging of breast: Secondary | ICD-10-CM | POA: Diagnosis not present

## 2022-03-20 ENCOUNTER — Encounter (HOSPITAL_COMMUNITY)
Admission: RE | Disposition: A | Discharge status: Home / Self Care | Payer: Self-pay | Source: Home / Self Care | Attending: Surgery

## 2022-03-20 ENCOUNTER — Other Ambulatory Visit: Payer: Self-pay

## 2022-03-20 ENCOUNTER — Ambulatory Visit (HOSPITAL_COMMUNITY): Payer: Medicare PPO | Admitting: Vascular Surgery

## 2022-03-20 ENCOUNTER — Ambulatory Visit (HOSPITAL_COMMUNITY)
Admission: RE | Admit: 2022-03-20 | Discharge: 2022-03-20 | Disposition: A | Discharge status: Home / Self Care | Payer: Medicare PPO | Attending: Surgery | Admitting: Surgery

## 2022-03-20 ENCOUNTER — Encounter (HOSPITAL_COMMUNITY): Payer: Self-pay | Admitting: Surgery

## 2022-03-20 ENCOUNTER — Ambulatory Visit (HOSPITAL_BASED_OUTPATIENT_CLINIC_OR_DEPARTMENT_OTHER): Payer: Medicare PPO | Admitting: Anesthesiology

## 2022-03-20 ENCOUNTER — Ambulatory Visit
Admission: RE | Admit: 2022-03-20 | Discharge: 2022-03-20 | Disposition: A | Discharge status: Home / Self Care | Payer: Medicare PPO | Source: Ambulatory Visit | Attending: Surgery | Admitting: Surgery

## 2022-03-20 DIAGNOSIS — Z170 Estrogen receptor positive status [ER+]: Secondary | ICD-10-CM | POA: Diagnosis not present

## 2022-03-20 DIAGNOSIS — I10 Essential (primary) hypertension: Secondary | ICD-10-CM | POA: Insufficient documentation

## 2022-03-20 DIAGNOSIS — D0512 Intraductal carcinoma in situ of left breast: Secondary | ICD-10-CM | POA: Diagnosis not present

## 2022-03-20 DIAGNOSIS — C50919 Malignant neoplasm of unspecified site of unspecified female breast: Secondary | ICD-10-CM

## 2022-03-20 DIAGNOSIS — R921 Mammographic calcification found on diagnostic imaging of breast: Secondary | ICD-10-CM | POA: Diagnosis not present

## 2022-03-20 DIAGNOSIS — R928 Other abnormal and inconclusive findings on diagnostic imaging of breast: Secondary | ICD-10-CM | POA: Diagnosis not present

## 2022-03-20 DIAGNOSIS — N6012 Diffuse cystic mastopathy of left breast: Secondary | ICD-10-CM | POA: Diagnosis not present

## 2022-03-20 HISTORY — DX: Malignant neoplasm of unspecified site of unspecified female breast: C50.919

## 2022-03-20 HISTORY — PX: BREAST LUMPECTOMY WITH RADIOACTIVE SEED LOCALIZATION: SHX6424

## 2022-03-20 SURGERY — BREAST LUMPECTOMY WITH RADIOACTIVE SEED LOCALIZATION
Anesthesia: General | Site: Breast | Laterality: Left

## 2022-03-20 MED ORDER — PROPOFOL 10 MG/ML IV BOLUS
INTRAVENOUS | Status: AC
  Filled 2022-03-20: qty 20

## 2022-03-20 MED ORDER — BUPIVACAINE-EPINEPHRINE 0.25% -1:200000 IJ SOLN
INTRAMUSCULAR | Status: DC | PRN
  Administered 2022-03-20: 10 mL

## 2022-03-20 MED ORDER — CHLORHEXIDINE GLUCONATE CLOTH 2 % EX PADS: 6.0000 | MEDICATED_PAD | Freq: Once | CUTANEOUS | Status: DC

## 2022-03-20 MED ORDER — CHLORHEXIDINE GLUCONATE 0.12 % MT SOLN
15.0000 mL | Freq: Once | OROMUCOSAL | Status: AC
  Administered 2022-03-20: 15 mL via OROMUCOSAL
  Filled 2022-03-20: qty 15

## 2022-03-20 MED ORDER — ACETAMINOPHEN 500 MG PO TABS
1000.0000 mg | ORAL_TABLET | ORAL | Status: AC
  Administered 2022-03-20: 1000 mg via ORAL
  Filled 2022-03-20: qty 2

## 2022-03-20 MED ORDER — ONDANSETRON HCL 4 MG/2ML IJ SOLN
INTRAMUSCULAR | Status: DC | PRN
  Administered 2022-03-20: 4 mg via INTRAVENOUS

## 2022-03-20 MED ORDER — LIDOCAINE 2% (20 MG/ML) 5 ML SYRINGE
INTRAMUSCULAR | Status: DC | PRN
  Administered 2022-03-20: 100 mg via INTRAVENOUS

## 2022-03-20 MED ORDER — LIDOCAINE 2% (20 MG/ML) 5 ML SYRINGE
INTRAMUSCULAR | Status: AC
  Filled 2022-03-20: qty 5

## 2022-03-20 MED ORDER — LACTATED RINGERS IV SOLN: INTRAVENOUS | Status: DC

## 2022-03-20 MED ORDER — FENTANYL CITRATE (PF) 100 MCG/2ML IJ SOLN
INTRAMUSCULAR | Status: DC | PRN
Start: 2022-03-20 — End: 2022-03-20
  Administered 2022-03-20 (×2): 50 ug via INTRAVENOUS

## 2022-03-20 MED ORDER — PROPOFOL 10 MG/ML IV BOLUS
INTRAVENOUS | Status: DC | PRN
  Administered 2022-03-20: 170 mg via INTRAVENOUS

## 2022-03-20 MED ORDER — KETOROLAC TROMETHAMINE 30 MG/ML IJ SOLN
INTRAMUSCULAR | Status: DC | PRN
  Administered 2022-03-20: 30 mg via INTRAVENOUS

## 2022-03-20 MED ORDER — MIDAZOLAM HCL 5 MG/5ML IJ SOLN
INTRAMUSCULAR | Status: DC | PRN
  Administered 2022-03-20: 2 mg via INTRAVENOUS

## 2022-03-20 MED ORDER — 0.9 % SODIUM CHLORIDE (POUR BTL) OPTIME
TOPICAL | Status: DC | PRN
  Administered 2022-03-20: 1000 mL

## 2022-03-20 MED ORDER — DEXAMETHASONE SODIUM PHOSPHATE 10 MG/ML IJ SOLN
INTRAMUSCULAR | Status: DC | PRN
  Administered 2022-03-20: 10 mg via INTRAVENOUS

## 2022-03-20 MED ORDER — ORAL CARE MOUTH RINSE: 15.0000 mL | Freq: Once | OROMUCOSAL | Status: AC

## 2022-03-20 MED ORDER — BUPIVACAINE-EPINEPHRINE (PF) 0.25% -1:200000 IJ SOLN
INTRAMUSCULAR | Status: AC
  Filled 2022-03-20: qty 30

## 2022-03-20 MED ORDER — CEFAZOLIN SODIUM-DEXTROSE 2-4 GM/100ML-% IV SOLN
2.0000 g | INTRAVENOUS | Status: AC
  Administered 2022-03-20: 2 g via INTRAVENOUS
  Filled 2022-03-20: qty 100

## 2022-03-20 MED ORDER — FENTANYL CITRATE (PF) 250 MCG/5ML IJ SOLN
INTRAMUSCULAR | Status: AC
  Filled 2022-03-20: qty 5

## 2022-03-20 MED ORDER — EPHEDRINE SULFATE-NACL 50-0.9 MG/10ML-% IV SOSY
PREFILLED_SYRINGE | INTRAVENOUS | Status: DC | PRN
  Administered 2022-03-20: 10 mg via INTRAVENOUS
  Administered 2022-03-20 (×3): 5 mg via INTRAVENOUS

## 2022-03-20 MED ORDER — MIDAZOLAM HCL 2 MG/2ML IJ SOLN
INTRAMUSCULAR | Status: AC
  Filled 2022-03-20: qty 2

## 2022-03-20 SURGICAL SUPPLY — 38 items
APPLIER CLIP 9.375 MED OPEN (MISCELLANEOUS) ×2
BAG COUNTER SPONGE SURGICOUNT (BAG) ×2 IMPLANT
BENZOIN TINCTURE PRP APPL 2/3 (GAUZE/BANDAGES/DRESSINGS) ×2 IMPLANT
BINDER BREAST XLRG (GAUZE/BANDAGES/DRESSINGS) ×1 IMPLANT
CANISTER SUCT 3000ML PPV (MISCELLANEOUS) ×2 IMPLANT
CHLORAPREP W/TINT 26 (MISCELLANEOUS) ×2 IMPLANT
CLIP APPLIE 9.375 MED OPEN (MISCELLANEOUS) IMPLANT
COVER PROBE W GEL 5X96 (DRAPES) ×2 IMPLANT
COVER SURGICAL LIGHT HANDLE (MISCELLANEOUS) ×2 IMPLANT
DEVICE DUBIN SPECIMEN MAMMOGRA (MISCELLANEOUS) ×2 IMPLANT
DRAPE CHEST BREAST 15X10 FENES (DRAPES) ×2 IMPLANT
DRSG IV TEGADERM 3.5X4.5 STRL (GAUZE/BANDAGES/DRESSINGS) ×1 IMPLANT
DRSG TEGADERM 4X4.75 (GAUZE/BANDAGES/DRESSINGS) ×2 IMPLANT
ELECT CAUTERY BLADE 6.4 (BLADE) ×2 IMPLANT
ELECT REM PT RETURN 9FT ADLT (ELECTROSURGICAL) ×2
ELECTRODE REM PT RTRN 9FT ADLT (ELECTROSURGICAL) ×1 IMPLANT
GAUZE SPONGE 2X2 8PLY STRL LF (GAUZE/BANDAGES/DRESSINGS) ×1 IMPLANT
GLOVE BIO SURGEON STRL SZ7 (GLOVE) ×2 IMPLANT
GLOVE BIOGEL PI IND STRL 7.5 (GLOVE) ×1 IMPLANT
GLOVE BIOGEL PI INDICATOR 7.5 (GLOVE) ×1
GOWN STRL REUS W/ TWL LRG LVL3 (GOWN DISPOSABLE) ×2 IMPLANT
GOWN STRL REUS W/TWL LRG LVL3 (GOWN DISPOSABLE) ×2
KIT BASIN OR (CUSTOM PROCEDURE TRAY) ×2 IMPLANT
KIT MARKER MARGIN INK (KITS) ×2 IMPLANT
NDL HYPO 25GX1X1/2 BEV (NEEDLE) ×1 IMPLANT
NEEDLE HYPO 25GX1X1/2 BEV (NEEDLE) ×2 IMPLANT
NS IRRIG 1000ML POUR BTL (IV SOLUTION) ×2 IMPLANT
PACK GENERAL/GYN (CUSTOM PROCEDURE TRAY) ×2 IMPLANT
SPONGE GAUZE 2X2 8PLY STRL LF (GAUZE/BANDAGES/DRESSINGS) ×1 IMPLANT
SPONGE GAUZE 2X2 STER 10/PKG (GAUZE/BANDAGES/DRESSINGS) ×1
SPONGE T-LAP 4X18 ~~LOC~~+RFID (SPONGE) ×2 IMPLANT
STRIP CLOSURE SKIN 1/2X4 (GAUZE/BANDAGES/DRESSINGS) ×2 IMPLANT
SUT MNCRL AB 4-0 PS2 18 (SUTURE) ×2 IMPLANT
SUT VIC AB 3-0 SH 27 (SUTURE) ×1
SUT VIC AB 3-0 SH 27X BRD (SUTURE) ×1 IMPLANT
SYR CONTROL 10ML LL (SYRINGE) ×2 IMPLANT
TOWEL GREEN STERILE (TOWEL DISPOSABLE) ×2 IMPLANT
TOWEL GREEN STERILE FF (TOWEL DISPOSABLE) ×2 IMPLANT

## 2022-03-20 NOTE — Discharge Instructions (Signed)
[facility] Office Phone Number [phone]  BREAST BIOPSY/ PARTIAL MASTECTOMY: POST OP INSTRUCTIONS  Always review your discharge instruction sheet given to you by the facility where your surgery was performed.  IF YOU HAVE DISABILITY OR FAMILY LEAVE FORMS, YOU MUST BRING THEM TO THE OFFICE FOR PROCESSING.  DO NOT GIVE THEM TO YOUR DOCTOR.  A prescription for pain medication may be given to you upon discharge.  Take your pain medication as prescribed, if needed.  If narcotic pain medicine is not needed, then you may take acetaminophen (Tylenol) or ibuprofen (Advil) as needed. Take your usually prescribed medications unless otherwise directed If you need a refill on your pain medication, please contact your pharmacy.  They will contact our office to request authorization.  Prescriptions will not be filled after 5pm or on week-ends. You should eat very light the first 24 hours after surgery, such as soup, crackers, pudding, etc.  Resume your normal diet the day after surgery. Most patients will experience some swelling and bruising in the breast.  Ice packs and a good support bra will help.  Swelling and bruising can take several days to resolve.  It is common to experience some constipation if taking pain medication after surgery.  Increasing fluid intake and taking a stool softener will usually help or prevent this problem from occurring.  A mild laxative (Milk of Magnesia or Miralax) should be taken according to package directions if there are no bowel movements after 48 hours. Unless discharge instructions indicate otherwise, you may remove your bandages 48 hours after surgery, and you may shower at that time.  You will have steri-strips (small skin tapes) in place directly over the incision.  These strips should be left on the skin for 7-10 days.   Any sutures or staples will be removed at the office during your follow-up visit. ACTIVITIES:  You may resume regular daily  activities (gradually increasing) beginning the next day.  Wearing a good support bra or sports bra minimizes pain and swelling.  You may have sexual intercourse when it is comfortable. You may drive when you no longer are taking prescription pain medication, you can comfortably wear a seatbelt, and you can safely maneuver your car and apply brakes. RETURN TO WORK:  1-2 weeks You should see your doctor in the office for a follow-up appointment approximately two weeks after your surgery.  Your doctor's nurse will typically make your follow-up appointment when she calls you with your pathology report.  Expect your pathology report 2-3 business days after your surgery.  You may call to check if you do not hear from us after three days. OTHER INSTRUCTIONS: _______________________________________________________________________________________________ _____________________________________________________________________________________________________________________________________ _____________________________________________________________________________________________________________________________________ _____________________________________________________________________________________________________________________________________  WHEN TO CALL YOUR DOCTOR: Fever over 101.0 Nausea and/or vomiting. Extreme swelling or bruising. Continued bleeding from incision. Increased pain, redness, or drainage from the incision.  The clinic staff is available to answer your questions during regular business hours.  Please don't hesitate to call and ask to speak to one of the nurses for clinical concerns.  If you have a medical emergency, go to the nearest emergency room or call 911.  A surgeon from [facility] is always on call at the hospital.  For further questions, please visit [email or web address]   

## 2022-03-20 NOTE — Transfer of Care (Signed)
Immediate Anesthesia Transfer of Care Note ? ?Patient: Karina Allen ? ?Procedure(s) Performed: LEFT BREAST LUMPECTOMY WITH RADIOACTIVE SEED LOCALIZATION (Left: Breast) ? ?Patient Location: PACU ? ?Anesthesia Type:General ? ?Level of Consciousness: awake, alert  and oriented ? ?Airway & Oxygen Therapy: Patient Spontanous Breathing ? ?Post-op Assessment: Report given to RN and Post -op Vital signs reviewed and stable ? ?Post vital signs: Reviewed and stable ? ?Last Vitals:  ?Vitals Value Taken Time  ?BP 142/59 03/20/22 0948  ?Temp    ?Pulse 82 03/20/22 0949  ?Resp 9 03/20/22 0949  ?SpO2 98 % 03/20/22 0949  ?Vitals shown include unvalidated device data. ? ?Last Pain:  ?Vitals:  ? 03/20/22 0737  ?TempSrc:   ?PainSc: 0-No pain  ?   ? ?  ? ?Complications: No notable events documented. ?

## 2022-03-20 NOTE — Op Note (Signed)
Pre-op Diagnosis:  left breast ductal carcinoma in situ ?Post-op Diagnosis: same ?Procedure:  Left radioactive seed localized lumpectomy ?Surgeon:  Maia Petties. ?Anesthesia:  GEN - LMA ?Indications: This is a 71 year old female in relatively good health with no family history of breast cancer who presents after a recent screening mammogram.  This revealed a 1.0 x 0.4 x 0.8 cm area of calcifications in the left lower outer breast.  She underwent stereotactic biopsy of this area which revealed ductal carcinoma in situ grade 2, ER/PR positive, Ki-67 2%.  There was no sign of invasive cancer.   ?Description of procedure: The patient is brought to the operating room placed in supine position on the operating room table. After an adequate level of general anesthesia was obtained, her left breast was prepped with ChloraPrep and draped in sterile fashion. A timeout was taken to ensure the proper patient and proper procedure. We interrogated the breast with the neoprobe. We made a circumareolar incision around the lower side of the nipple after infiltrating with 0.25% Marcaine. Dissection was carried down in the breast tissue with cautery. We used the neoprobe to guide [place] towards the radioactive seed. We excised an area of tissue around the radioactive seed 2 cm in diameter. The specimen was removed and was oriented with a paint kit. Specimen mammogram showed the radioactive seed as well as the biopsy clip within the specimen. This was sent for pathologic examination. There is no residual radioactivity within the biopsy cavity. We inspected carefully for hemostasis. The wound was thoroughly irrigated. The wound was closed with a deep layer of 3-0 Vicryl and a subcuticular layer of 4-0 Monocryl. Benzoin Steri-Strips were applied. The patient was then extubated and brought to the recovery room in stable condition. All sponge, instrument, and needle counts are correct. ? ?Karina Allen. Karina Pollok, MD, FACS ?[facility]   ?General/ Trauma Surgery ? ?03/20/2022 ?9:38 AM ? ? ?

## 2022-03-20 NOTE — Anesthesia Procedure Notes (Signed)
Procedure Name: LMA Insertion ?Date/Time: 03/20/2022 9:00 AM ?Performed by: Gwyndolyn Saxon, CRNA ?Pre-anesthesia Checklist: Patient identified, Emergency Drugs available, Suction available and Patient being monitored ?Patient Re-evaluated:Patient Re-evaluated prior to induction ?Oxygen Delivery Method: Circle system utilized ?Preoxygenation: Pre-oxygenation with 100% oxygen ?Induction Type: IV induction ?Ventilation: Mask ventilation without difficulty ?LMA: LMA inserted ?LMA Size: 4.0 ?Number of attempts: 1 ?Placement Confirmation: positive ETCO2 and breath sounds checked- equal and bilateral ?Tube secured with: Tape ?Dental Injury: Teeth and Oropharynx as per pre-operative assessment  ? ? ? ? ?

## 2022-03-20 NOTE — Anesthesia Preprocedure Evaluation (Addendum)
Anesthesia Evaluation  ?Patient identified by MRN, date of birth, ID band ?Patient awake ? ? ? ?Reviewed: ?Allergy & Precautions, NPO status , Patient's Chart, lab work & pertinent test results ? ?History of Anesthesia Complications ?Negative for: history of anesthetic complications ? ?Airway ?Mallampati: II ? ?TM Distance: >3 FB ?Neck ROM: Full ? ? ? Dental ?no notable dental hx. ? ?  ?Pulmonary ?neg pulmonary ROS,  ?  ?Pulmonary exam normal ? ? ? ? ? ? ? Cardiovascular ?hypertension, Pt. on medications ?Normal cardiovascular exam ? ? ?  ?Neuro/Psych ?Anxiety Depression negative neurological ROS ?   ? GI/Hepatic ?negative GI ROS, Neg liver ROS,   ?Endo/Other  ?Hypothyroidism  ? Renal/GU ?negative Renal ROS  ?negative genitourinary ?  ?Musculoskeletal ? ?(+) Arthritis ,  ? Abdominal ?  ?Peds ? Hematology ?negative hematology ROS ?(+)   ?Anesthesia Other Findings ?Left breast ca ? Reproductive/Obstetrics ?negative OB ROS ? ?  ? ? ? ? ? ? ? ? ? ? ? ? ? ?  ?  ? ? ? ? ? ? ? ?Anesthesia Physical ?Anesthesia Plan ? ?ASA: 2 ? ?Anesthesia Plan: General  ? ?Post-op Pain Management: Tylenol PO (pre-op)*  ? ?Induction: Intravenous ? ?PONV Risk Score and Plan: 3 and Treatment may vary due to age or medical condition, Ondansetron, Dexamethasone and Midazolam ? ?Airway Management Planned: LMA ? ?Additional Equipment: None ? ?Intra-op Plan:  ? ?Post-operative Plan: Extubation in OR ? ?Informed Consent: I have reviewed the patients History and Physical, chart, labs and discussed the procedure including the risks, benefits and alternatives for the proposed anesthesia with the patient or authorized representative who has indicated his/her understanding and acceptance.  ? ? ? ?Dental advisory given ? ?Plan Discussed with: CRNA ? ?Anesthesia Plan Comments:   ? ? ? ? ? ?Anesthesia Quick Evaluation ? ?

## 2022-03-20 NOTE — Interval H&P Note (Signed)
History and Physical Interval Note: ? ?03/20/2022 ?6:54 AM ? ?Karina Allen  has presented today for surgery, with the diagnosis of LEFT BREAST DCIS.  The various methods of treatment have been discussed with the patient and family. After consideration of risks, benefits and other options for treatment, the patient has consented to  Procedure(s): ?LEFT BREAST LUMPECTOMY WITH RADIOACTIVE SEED LOCALIZATION (Left) as a surgical intervention.  The patient's history has been reviewed, patient examined, no change in status, stable for surgery.  I have reviewed the patient's chart and labs.  Questions were answered to the patient's satisfaction.   ? ? ?Imogene Burn Leslye Puccini ? ? ?

## 2022-03-20 NOTE — Anesthesia Postprocedure Evaluation (Signed)
Anesthesia Post Note ? ?Patient: Karina Allen ? ?Procedure(s) Performed: LEFT BREAST LUMPECTOMY WITH RADIOACTIVE SEED LOCALIZATION (Left: Breast) ? ?  ? ?Patient location during evaluation: PACU ?Anesthesia Type: General ?Level of consciousness: awake and alert ?Pain management: pain level controlled ?Vital Signs Assessment: post-procedure vital signs reviewed and stable ?Respiratory status: spontaneous breathing, nonlabored ventilation and respiratory function stable ?Cardiovascular status: blood pressure returned to baseline ?Postop Assessment: no apparent nausea or vomiting ?Anesthetic complications: no ? ? ?No notable events documented. ? ?Last Vitals:  ?Vitals:  ? 03/20/22 0710 03/20/22 0948  ?BP: (!) 155/78 (!) 142/59  ?Pulse: 84 82  ?Resp: 18 12  ?Temp: 36.7 ?C 36.7 ?C  ?SpO2: 97% 98%  ?  ?Last Pain:  ?Vitals:  ? 03/20/22 0948  ?TempSrc:   ?PainSc: 0-No pain  ? ? ?  ?  ?  ?  ?  ?  ? ?Marthenia Rolling ? ? ? ? ?

## 2022-03-21 ENCOUNTER — Encounter (HOSPITAL_COMMUNITY): Payer: Self-pay | Admitting: Surgery

## 2022-03-21 LAB — SURGICAL PATHOLOGY

## 2022-03-27 ENCOUNTER — Encounter: Payer: Self-pay | Admitting: *Deleted

## 2022-03-30 NOTE — Progress Notes (Signed)
New Breast Cancer Diagnosis: Left Breast LOQ  Did patient present with symptoms (if so, please note symptoms) or screening mammography?: Screening Calcifications   Location and Extent of disease: left breast. Located in the lower outer quadrant, measured 1.0 x 0.4 x 0.8 cm in greatest dimension. Adenopathy no.   Histology per Pathology Report: grade 2, DCIS 03/20/2022  Receptor Status: ER(positive), PR (positive), Her2-neu (), Ki-(2%)  Surgeon and surgical plan, if any: Dr. Georgette Dover - Left Breast Lumpectomy with radioactive seed localization.   Medical oncologist, treatment if any:   Dr. Chryl Heck 04/04/2022  Family History of Breast/Ovarian/Prostate Cancer:   Lymphedema issues, if any:      Pain issues, if any:     SAFETY ISSUES: Prior radiation?  Pacemaker/ICD?  Possible current pregnancy? Is the patient on methotrexate?   Current Complaints / other details:

## 2022-04-04 ENCOUNTER — Inpatient Hospital Stay: Payer: Medicare PPO

## 2022-04-04 ENCOUNTER — Ambulatory Visit
Admission: RE | Admit: 2022-04-04 | Discharge: 2022-04-04 | Disposition: A | Discharge status: Home / Self Care | Payer: Medicare PPO | Source: Ambulatory Visit | Attending: Radiation Oncology | Admitting: Radiation Oncology

## 2022-04-04 ENCOUNTER — Other Ambulatory Visit: Payer: Self-pay

## 2022-04-04 ENCOUNTER — Inpatient Hospital Stay: Payer: Medicare PPO | Attending: Hematology and Oncology | Admitting: Hematology and Oncology

## 2022-04-04 ENCOUNTER — Encounter: Payer: Self-pay | Admitting: Radiation Oncology

## 2022-04-04 ENCOUNTER — Encounter: Payer: Self-pay | Admitting: Hematology and Oncology

## 2022-04-04 VITALS — BP 139/63 | HR 70 | Temp 97.7°F | Resp 20 | Ht 64.5 in | Wt 215.8 lb

## 2022-04-04 DIAGNOSIS — I10 Essential (primary) hypertension: Secondary | ICD-10-CM | POA: Insufficient documentation

## 2022-04-04 DIAGNOSIS — Z79899 Other long term (current) drug therapy: Secondary | ICD-10-CM | POA: Diagnosis not present

## 2022-04-04 DIAGNOSIS — C50512 Malignant neoplasm of lower-outer quadrant of left female breast: Secondary | ICD-10-CM | POA: Insufficient documentation

## 2022-04-04 DIAGNOSIS — M1990 Unspecified osteoarthritis, unspecified site: Secondary | ICD-10-CM | POA: Diagnosis not present

## 2022-04-04 DIAGNOSIS — Z79810 Long term (current) use of selective estrogen receptor modulators (SERMs): Secondary | ICD-10-CM | POA: Insufficient documentation

## 2022-04-04 DIAGNOSIS — Z170 Estrogen receptor positive status [ER+]: Secondary | ICD-10-CM | POA: Diagnosis not present

## 2022-04-04 DIAGNOSIS — E079 Disorder of thyroid, unspecified: Secondary | ICD-10-CM | POA: Insufficient documentation

## 2022-04-04 MED ORDER — TAMOXIFEN CITRATE 20 MG PO TABS: 20.0000 mg | ORAL_TABLET | Freq: Every day | ORAL | 1 refills | Status: DC

## 2022-04-04 NOTE — Assessment & Plan Note (Addendum)
This is a very pleasant 71 year old female patient with DCIS of left breast, ER/PR strongly positive status post left breast lumpectomy referred to medical oncology for additional recommendations.  We have discussed the following details about DCIS  Pathology review: I discussed with the patient the difference between DCIS and invasive breast cancer. It is considered a precancerous lesion. DCIS is classified as a Stage 0 breast cancer. It is generally detected through mammograms as calcifications. We discussed the significance of grades and its impact on prognosis. We also discussed the importance of ER and PR receptors and their implications to adjuvant treatment options. Prognosis of DCIS dependence on grade and degree of comedo necrosis. It is anticipated that if not treated, 20-30% of DCIS can develop into invasive breast cancer.  Recommendation: 1. Breast conserving surgery 2. Followed by adjuvant radiation therapy 3. Followed by antiestrogen therapy with tamoxifen/aromatase inhibitors based on menopausal status 5 years  She has already completed left breast lumpectomy, closest margin is 1 mm.  She will be starting radiation in third week of June.  We have discussed the following details about antiestrogen therapy  Tamoxifen counseling: We discussed the risks and benefits of tamoxifen. These include but not limited to insomnia, hot flashes, mood changes, vaginal dryness, and weight gain. Although rare, serious side effects including endometrial cancer, risk of blood clots were also discussed. We strongly believe that the benefits far outweigh the risks. Patient understands these risks and consented to starting treatment. Planned treatment duration is 5 years.  Aromatase inhibitors counseling: We have discussed the mechanism of action of aromatase inhibitors today.  We have discussed adverse effects including but not limited to menopausal symptoms, increased risk of osteoporosis and fractures,  cardiovascular events, arthralgias and myalgias.  We do believe that the benefits far outweigh the risks.  Plan treatment duration of 5 years.  She is currently leaning towards tamoxifen since she is more familiar with this medication and a lot of her acquaintances are on tamoxifen.  Since she is over 80 and pathology is DCIS, there is no difference in efficacy of tamoxifen versus aromatase inhibitors.  All her questions were answered to the best of my knowledge.  Tamoxifen prescription has been sent to the pharmacy of her choice.  She was clearly instructed to hold it until she completes adjuvant radiation and started early August and note the date of initiation.  She will return to clinic in about 3 months after she starts tamoxifen, anticipate early November.  She was however strongly encouraged to contact us with any adverse effects or concerns and if she needs a sooner appointment.

## 2022-04-04 NOTE — Progress Notes (Signed)
[facility] NOTE  Patient Care Team: Isaac Bliss, Rayford Halsted, MD as PCP - General (Internal Medicine) Mauro Kaufmann, RN as Oncology Nurse Navigator Rockwell Germany, RN as Oncology Nurse Navigator Benay Pike, MD as Consulting Physician (Hematology and Oncology)  CHIEF COMPLAINTS/PURPOSE OF CONSULTATION:  Newly diagnosed breast cancer  HISTORY OF PRESENTING ILLNESS:  Karina Allen 71 y.o. female is here because of recent diagnosis of left breast DCIS  I reviewed her records extensively and collaborated the history with the patient.  SUMMARY OF ONCOLOGIC HISTORY: Oncology History  Malignant neoplasm of lower-outer quadrant of left breast of female, estrogen receptor positive ([place])  02/07/2022 Mammogram   Screening mammogram from February 07, 2022 showed calcifications in the left breast.  Diagnostic mammogram confirmed a group of indeterminate microcalcifications in the lateral lower left breast measuring 1 x 0.4 x 8.8 cm.    03/05/2022 Pathology Results   Pathology from the left breast showed DCIS with predominantly cribriform growth pattern and scattered foci of intraluminal microcalcification, grade 2, ER 100% positive strong staining, PR 70% positive moderate staining and Ki-67 of 2%   03/12/2022 Initial Diagnosis   Malignant neoplasm of lower-outer quadrant of left breast of female, estrogen receptor positive ([place]).    03/20/2022 Surgery   She had left breast lumpectomy which showed focus of residual ductal carcinoma in situ with calcifications, intermediate grade, 2.5 mm.  DCIS is 0.1 cm from the inked medial margin    Birth control: she used long time ago in 1980's, used it for about less than 5 yrs No use of HRT. Husband died 55 yrs ago, had lupus She has 2 kids, one daughter and one son. She has been healing well from her surgery.  She is a healthy 71 year old, very active in church, volunteering, continues to assist with home schooling for her 3  grandkids. Rest of the pertinent 10 point ROS reviewed and negative  MEDICAL HISTORY:  Past Medical History:  Diagnosis Date   Arthritis    osteo   Breast cancer ([place]) 03/20/2022   Depression    Epiretinal membrane 02/05/2017   Sees Dr. Valetta Close and Dr. Iona Hansen   Essential hypertension 04/27/2014   FH: migraines    GAD (generalized anxiety disorder) 08/04/2015   Hepatitis B    reports dx and tx as a young child, reports no issues since and retested as adult and told did not have hepatitis, she is unsure of type of hep she had   Thyroid disease     SURGICAL HISTORY: Past Surgical History:  Procedure Laterality Date   APPENDECTOMY     BREAST LUMPECTOMY WITH RADIOACTIVE SEED LOCALIZATION Left 03/20/2022   Procedure: LEFT BREAST LUMPECTOMY WITH RADIOACTIVE SEED LOCALIZATION;  Surgeon: Donnie Mesa, MD;  Location: [facility];  Service: General;  Laterality: Left;   CATARACT EXTRACTION     CHOLECYSTECTOMY     ELBOW SURGERY     shattered elbow   endometrial oblation     FOOT SURGERY     RETINAL DETACHMENT SURGERY     REVERSE SHOULDER ARTHROPLASTY Left 09/20/2021   Procedure: LEFT REVERSE SHOULDER ARTHROPLASTY;  Surgeon: Hiram Gash, MD;  Location: [facility];  Service: Orthopedics;  Laterality: Left;   TONSILLECTOMY AND ADENOIDECTOMY     TUBAL LIGATION      SOCIAL HISTORY: Social History   Socioeconomic History   Marital status: Widowed    Spouse name: Not on file   Number of children: Not on  file   Years of education: Not on file   Highest education level: Not on file  Occupational History   Not on file  Tobacco Use   Smoking status: Never   Smokeless tobacco: Never  Substance and Sexual Activity   Alcohol use: Not Currently    Comment: stopped 5 years ago per patient   Drug use: No   Sexual activity: Not on file  Other Topics Concern   Not on file  Social History Narrative   Work or School: Psychologist, occupational at [employer], 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  MEDICATIONS:  Current Outpatient Medications  Medication Sig Dispense Refill   tamoxifen (NOLVADEX) 20 MG tablet Take 1 tablet (20 mg total) by mouth daily. 90 tablet 1   carboxymethylcellulose (REFRESH PLUS) 0.5 % SOLN Place 1 drop into both eyes 3 (three) times daily as needed (dry eyes).     Cholecalciferol (SM VITAMIN D3) 100 MCG (4000 UT) CAPS Take 1 capsule by mouth daily. DV3 + Immune Support     hydrochlorothiazide (HYDRODIURIL) 25 MG tablet Take 1 tablet (25 mg total) by mouth daily. 90 tablet 1   levothyroxine (SYNTHROID) 175 MCG tablet Take 1 tablet (175 mcg total) by mouth daily with breakfast. 90 tablet 1   NON FORMULARY Take 1 capsule by mouth daily. Doterra  Food Nutrient Complex     olmesartan (BENICAR) 5 MG tablet Take 2 tablets (10 mg total) by mouth daily. 180 tablet 1   No current facility-administered medications for this visit.    REVIEW OF SYSTEMS:   Constitutional: Denies fevers, chills or abnormal night sweats Eyes: Denies blurriness of vision, double vision or watery eyes Ears, nose, mouth, throat, and face: Denies mucositis or sore throat Respiratory: Denies cough, dyspnea or wheezes Cardiovascular: Denies palpitation, chest discomfort or lower extremity swelling Gastrointestinal:  Denies nausea, heartburn or change in bowel habits Skin: Denies abnormal skin rashes Lymphatics: Denies new lymphadenopathy or easy bruising Neurological:Denies  numbness, tingling or new weaknesses Behavioral/Psych: Mood is stable, no new changes  Breast: Denies any palpable lumps or discharge All other systems were reviewed with the patient and are negative.  PHYSICAL EXAMINATION: ECOG PERFORMANCE STATUS: 0 - Asymptomatic  Vitals:   04/04/22 1359  BP: 139/63  Pulse: 70  Resp: 20  Temp: 97.7 F (36.5 C)  SpO2: 100%   Filed Weights   04/04/22 1359  Weight: 215 lb 1.9 oz (97.6 kg)    GENERAL:alert, no distress and comfortable SKIN: skin color, texture, turgor are normal, no rashes or significant lesions EYES: normal, conjunctiva are pink and non-injected, sclera clear OROPHARYNX:no exudate, no erythema and lips, buccal mucosa, and tongue normal  NECK: supple, thyroid normal size, non-tender, without nodularity LYMPH:  no palpable lymphadenopathy in the cervical, axillary or inguinal LUNGS: clear to auscultation and percussion with normal breathing effort HEART: regular rate & rhythm and no  murmurs and no lower extremity edema ABDOMEN:abdomen soft, non-tender and normal bowel sounds Musculoskeletal:no cyanosis of digits and no clubbing  PSYCH: alert & oriented x 3 with fluent speech NEURO: no focal motor/sensory deficits BREAST: Bilateral breasts inspected.  Left breast healing well from recent surgery.  No other palpable masses or regional adenopathy   LABORATORY DATA:  I have reviewed the data as listed Lab Results  Component Value Date   WBC 9.7 03/16/2022   HGB 12.5 03/16/2022   HCT 36.9 03/16/2022   MCV 86.0 03/16/2022   PLT 289 03/16/2022   Lab Results  Component Value Date   NA 138 03/16/2022   K 3.5 03/16/2022   CL 105 03/16/2022   CO2 27 03/16/2022    RADIOGRAPHIC STUDIES: I have personally reviewed the radiological reports and agreed with the findings in the report.  ASSESSMENT AND PLAN:    Malignant neoplasm of lower-outer quadrant of left breast of female, estrogen receptor positive ([place]) This is a very  pleasant 71 year old female patient with DCIS of left breast, ER/PR strongly positive status post left breast lumpectomy referred to medical oncology for additional recommendations.  We have discussed the following details about DCIS  Pathology review: I discussed with the patient the difference between DCIS and invasive breast cancer. It is considered a precancerous lesion. DCIS is classified as a Stage 0 breast cancer. It is generally detected through mammograms as calcifications. We discussed the significance of grades and its impact on prognosis. We also discussed the importance of ER and PR receptors and their implications to adjuvant treatment options. Prognosis of DCIS dependence on grade and degree of comedo necrosis. It is anticipated that if not treated, 20-30% of DCIS can develop into invasive breast cancer.  Recommendation: 1. Breast conserving surgery 2. Followed by adjuvant radiation therapy 3. Followed by antiestrogen therapy with tamoxifen/aromatase inhibitors based on menopausal status 5 years  She has already completed left breast lumpectomy, closest margin is 1 mm.  She will be starting radiation in third week of June.  We have discussed the following details about antiestrogen therapy  Tamoxifen counseling: We discussed the risks and benefits of tamoxifen. These include but not limited to insomnia, hot flashes, mood changes, vaginal dryness, and weight gain. Although rare, serious side effects including endometrial cancer, risk of blood clots were also discussed. We strongly believe that the benefits far outweigh the risks. Patient understands these risks and consented to starting treatment. Planned treatment duration is 5 years.  Aromatase inhibitors counseling: We have discussed the mechanism of action of aromatase inhibitors today.  We have discussed adverse effects including but not limited to menopausal symptoms, increased risk of osteoporosis and fractures, cardiovascular  events, arthralgias and myalgias.  We do believe that the benefits far outweigh the risks.  Plan treatment duration of 5 years.  She is currently leaning towards tamoxifen since she is more familiar with this medication and a lot of her acquaintances are on tamoxifen.  Since she is over 50 and pathology is DCIS, there is no difference in efficacy of tamoxifen versus aromatase inhibitors.  All her questions were answered to the best of my knowledge.  Tamoxifen prescription has been sent to the pharmacy of her choice.  She was clearly instructed to hold it until she completes adjuvant radiation and started early August and note the date of initiation.  She will return to clinic in about 3 months after she starts tamoxifen, anticipate early November.  She was however strongly encouraged to  contact us with any adverse effects or concerns and if she needs a sooner appointment.  Total time spent: 60 minutes including history, review of records, counseling and coordination of care All questions were answered. The patient knows to call the clinic with any problems, questions or concerns.    Benay Pike, MD 04/04/22

## 2022-04-04 NOTE — Progress Notes (Signed)
Radiation Oncology         [phone] ________________________________  Name: Karina Allen        MRN: [id number]  Date of Service: 04/04/2022 DOB: 01/15/1951  CC:Isaac Bliss, Rayford Halsted, MD  Donnie Mesa, MD     REFERRING PHYSICIAN: Donnie Mesa, MD   DIAGNOSIS: The encounter diagnosis was Malignant neoplasm of lower-outer quadrant of left breast of female, estrogen receptor positive ([place]).   HISTORY OF PRESENT ILLNESS: Karina Allen is a 71 y.o. female with a new diagnosis of left breast cancer.  The patient was found to have a screening detected abnormality in the left breast; further classification identified a group of calcifications in the lower outer quadrant of the left breast measuring up to 1 cm.  By ultrasound her axilla was also negative for adenopathy.  A biopsy on 03/05/2022 revealed intermediate grade DCIS with predominantly cribriform pattern of growth and scattered foci of intraluminal microcalcification, no evidence of necrosis was identified.  Her tumor was ER/PR positive.  She met with Dr. Georgette Dover and underwent left lumpectomy on 03/20/2022 which showed a focus of residual DCIS with calcifications.  The DCIS was 1 mm from the medial margin.  Given her pathology and the close margin she is seen to discuss the rationale for adjuvant radiotherapy.  She is scheduled to meet with Dr. Chryl Heck this afternoon as well.   PREVIOUS RADIATION THERAPY: No   PAST MEDICAL HISTORY:  Past Medical History:  Diagnosis Date   Arthritis    osteo   Depression    Epiretinal membrane 02/05/2017   Sees Dr. Valetta Close and Dr. Iona Hansen   Essential hypertension 04/27/2014   FH: migraines    GAD (generalized anxiety disorder) 08/04/2015   Hepatitis B    reports dx and tx as a young child, reports no issues since and retested as adult and told did not have hepatitis, she is unsure of type of hep she had   Thyroid disease        PAST SURGICAL HISTORY: Past Surgical History:  Procedure  Laterality Date   APPENDECTOMY     BREAST LUMPECTOMY WITH RADIOACTIVE SEED LOCALIZATION Left 03/20/2022   Procedure: LEFT BREAST LUMPECTOMY WITH RADIOACTIVE SEED LOCALIZATION;  Surgeon: Donnie Mesa, MD;  Location: [facility];  Service: General;  Laterality: Left;   CATARACT EXTRACTION     CHOLECYSTECTOMY     ELBOW SURGERY     shattered elbow   endometrial oblation     FOOT SURGERY     RETINAL DETACHMENT SURGERY     REVERSE SHOULDER ARTHROPLASTY Left 09/20/2021   Procedure: LEFT REVERSE SHOULDER ARTHROPLASTY;  Surgeon: Hiram Gash, MD;  Location: [place];  Service: Orthopedics;  Laterality: Left;   TONSILLECTOMY AND ADENOIDECTOMY     TUBAL LIGATION       FAMILY HISTORY:  Family History  Problem Relation Age of Onset   Prostate cancer Brother        46     SOCIAL HISTORY:  reports that she has never smoked. She has never used smokeless tobacco. She reports that she does not currently use alcohol. She reports that she does not use drugs.  The patient is widowed and lives in [place].  She is accompanied by her daughter.   ALLERGIES: Penicillins   MEDICATIONS:  Current Outpatient Medications  Medication Sig Dispense Refill   carboxymethylcellulose (REFRESH PLUS) 0.5 % SOLN Place 1 drop into both eyes 3 (three) times daily as needed (dry eyes).  Cholecalciferol (SM VITAMIN D3) 100 MCG (4000 UT) CAPS Take 1 capsule by mouth daily. DV3 + Immune Support     hydrochlorothiazide (HYDRODIURIL) 25 MG tablet Take 1 tablet (25 mg total) by mouth daily. 90 tablet 1   levothyroxine (SYNTHROID) 175 MCG tablet Take 1 tablet (175 mcg total) by mouth daily with breakfast. 90 tablet 1   NON FORMULARY Take 1 capsule by mouth daily. Doterra  Food Nutrient Complex     olmesartan (BENICAR) 5 MG tablet Take 2 tablets (10 mg total) by mouth daily. 180 tablet 1   No current facility-administered medications for this encounter.     REVIEW OF SYSTEMS: On review of systems, the  patient reports that she is doing well without any breast specific complaints.     PHYSICAL EXAM:  Wt Readings from Last 3 Encounters:  04/04/22 215 lb 12.8 oz (97.9 kg)  03/20/22 205 lb (93 kg)  03/16/22 215 lb 6.4 oz (97.7 kg)   Temp Readings from Last 3 Encounters:  04/04/22 97.7 F (36.5 C)  03/20/22 98 F (36.7 C)  03/16/22 (!) 97.5 F (36.4 C) (Oral)   BP Readings from Last 3 Encounters:  04/04/22 139/63  03/20/22 (!) 142/59  03/16/22 139/86   Pulse Readings from Last 3 Encounters:  04/04/22 70  03/20/22 82  03/16/22 67    In general this is a well appearing Caucasian female in no acute distress. She's alert and oriented x4 and appropriate throughout the examination. Cardiopulmonary assessment is negative for acute distress and she exhibits normal effort.  Her left breast has a well-healed surgical incision without erythema, separation, or drainage.    ECOG = 0  0 - Asymptomatic (Fully active, able to carry on all predisease activities without restriction)  1 - Symptomatic but completely ambulatory (Restricted in physically strenuous activity but ambulatory and able to carry out work of a light or sedentary nature. For example, light housework, office work)  2 - Symptomatic, <50% in bed during the day (Ambulatory and capable of all self care but unable to carry out any work activities. Up and about more than 50% of waking hours)  3 - Symptomatic, >50% in bed, but not bedbound (Capable of only limited self-care, confined to bed or chair 50% or more of waking hours)  4 - Bedbound (Completely disabled. Cannot carry on any self-care. Totally confined to bed or chair)  5 - Death   Eustace Pen MM, Creech RH, Tormey DC, et al. [phone]). "Toxicity and response criteria of the [facility] Group". [place] Oncol. 5 (6): 649-55    LABORATORY DATA:  Lab Results  Component Value Date   WBC 9.7 03/16/2022   HGB 12.5 03/16/2022   HCT 36.9 03/16/2022   MCV  86.0 03/16/2022   PLT 289 03/16/2022   Lab Results  Component Value Date   NA 138 03/16/2022   K 3.5 03/16/2022   CL 105 03/16/2022   CO2 27 03/16/2022   Lab Results  Component Value Date   ALT 19 03/16/2022   AST 20 03/16/2022   ALKPHOS 77 03/16/2022   BILITOT 0.8 03/16/2022      RADIOGRAPHY: MM Breast Surgical Specimen  Result Date: 03/20/2022 CLINICAL DATA:  Status post lumpectomy today after earlier radioactive seed localization. EXAM: SPECIMEN RADIOGRAPH OF THE LEFT BREAST COMPARISON:  Previous exams. FINDINGS: Status post excision of the left breast. The radioactive seed and biopsy marker clip are present, completely intact, and were marked for pathology. Findings discussed  with the OR staff during the procedure. IMPRESSION: Specimen radiograph of the left breast. Electronically Signed   By: Franki Cabot M.D.   On: 03/20/2022 09:28  MM LT RADIOACTIVE SEED LOC MAMMO GUIDE  Result Date: 03/19/2022 CLINICAL DATA:  Patient presents for seed localization prior to LEFT lumpectomy. EXAM: MAMMOGRAPHIC GUIDED RADIOACTIVE SEED LOCALIZATION OF THE LEFT BREAST COMPARISON:  Prior studies FINDINGS: Patient presents for radioactive seed localization prior to lumpectomy. I met with the patient and we discussed the procedure of seed localization including benefits and alternatives. We discussed the high likelihood of a successful procedure. We discussed the risks of the procedure including infection, bleeding, tissue injury and further surgery. We discussed the low dose of radioactivity involved in the procedure. Informed, written consent was given. The usual time-out protocol was performed immediately prior to the procedure. Using mammographic guidance, sterile technique, 1% lidocaine and an I-125 radioactive seed, the ribbon shaped clip in the LOWER OUTER QUADRANT of the LEFT breast was localized using a LATERAL approach. The follow-up mammogram images confirm the seed in the expected location and  were marked for Dr. Georgette Dover. Follow-up survey of the patient confirms presence of the radioactive seed. Order number of I-125 seed:  [id number]. Total activity:  4.210 millicuries reference Date: 02/25/2022 The patient tolerated the procedure well and was released from the [facility]. She was given instructions regarding seed removal. IMPRESSION: Radioactive seed localization left breast. No apparent complications. Electronically Signed   By: Nolon Nations M.D.   On: 03/19/2022 15:05      IMPRESSION/PLAN: 1. Intermediate grade, ER/PR positive DCIS of the left breast.Dr. Lisbeth Renshaw discusses the pathology findings and reviews the nature of early stage left breast disease.  She has done well with surgery, and is glad to hear that the tumor was completely resected.  Dr. Lisbeth Renshaw discusses the final pathology report specifically outlining the close medial margin.  He recommends external beam radiotherapy to the breast to reduce risks of local recurrence followed by antiestrogen therapy. We discussed the risks, benefits, short, and long term effects of radiotherapy, as well as the curative intent, and the patient is interested in proceeding. Dr. Lisbeth Renshaw discusses the delivery and logistics of radiotherapy and anticipates a course of 4 weeks of radiotherapy to the left breast with deep inspiration breath-hold technique. Written consent is obtained and placed in the chart, a copy was provided to the patient. The patient will be contacted to coordinate treatment planning by our simulation department.     In a visit lasting 60 minutes, greater than 50% of the time was spent face to face reviewing her case, as well as in preparation of, discussing, and coordinating the patient's care.  The above documentation reflects my direct findings during this shared patient visit. Please see the separate note by Dr. Lisbeth Renshaw on this date for the remainder of the patient's plan of care.    Carola Rhine, [facility]    **Disclaimer:  This note was dictated with voice recognition software. Similar sounding words can inadvertently be transcribed and this note may contain transcription errors which may not have been corrected upon publication of note.**

## 2022-04-05 ENCOUNTER — Encounter: Payer: Self-pay | Admitting: *Deleted

## 2022-04-10 ENCOUNTER — Inpatient Hospital Stay: Payer: Medicare PPO | Attending: Hematology and Oncology | Admitting: Licensed Clinical Social Worker

## 2022-04-10 NOTE — Progress Notes (Signed)
[place] Work  Initial Assessment   Karina Allen is a 71 y.o. year old female contacted by phone. Clinical Social Work was referred by  new patient protocol  for assessment of psychosocial needs.   SDOH (Social Determinants of Health) assessments performed: Yes SDOH Interventions    Flowsheet Row Most Recent Value  SDOH Interventions   Food Insecurity Interventions Intervention Not Indicated  Housing Interventions Intervention Not Indicated  Stress Interventions Intervention Not Indicated  Transportation Interventions Intervention Not Indicated       SDOH Screenings   Alcohol Screen: Not on file  Depression (PHQ2-9): Low Risk    PHQ-2 Score: 0  Financial Resource Strain: Not on file  Food Insecurity: No Food Insecurity   Worried About Charity fundraiser in the Last Year: Never true   Ran Out of Food in the Last Year: Never true  Housing: Low Risk    Last Housing Risk Score: 0  Physical Activity: Not on file  Social Connections: Not on file  Stress: No Stress Concern Present   Feeling of Stress : Not at all  Tobacco Use: Low Risk    Smoking Tobacco Use: Never   Smokeless Tobacco Use: Never   Passive Exposure: Not on file  Transportation Needs: No Transportation Needs   Lack of Transportation (Medical): No   Lack of Transportation (Non-Medical): No     Distress Screen completed: No    04/04/2022   12:54 PM  ONCBCN DISTRESS SCREENING  Screening Type Initial Screening  Distress experienced in past week (1-10) 0      Family/Social Information:  Housing Arrangement: patient lives alone. Family nearby Family members/support persons in your life? Family, Friends, and [employer] concerns: no  Employment: Retired .  Income source: Paediatric nurse concerns: No Type of concern: None Food access concerns: no Religious or spiritual practice: Yes-active in [employer] Currently in place:  n/a  Coping/ Adjustment to  diagnosis: Patient understands treatment plan and what happens next? yes, already had surgery and has radiation coming up. Healing well and no concerns at this time Concerns about diagnosis and/or treatment: I'm not especially worried about anything Patient reported stressors:  none Patient enjoys time with family/ friends and travel Current coping skills/ strengths: Capable of independent living , Armed forces logistics/support/administrative officer , Motivation for treatment/growth , Physical Health , Religious Affiliation , Special hobby/interest , and Supportive family/friends     SUMMARY: Current SDOH Barriers:  None at this time  Clinical Social Work Clinical Goal(s):  No clinical social work goals at this time  Interventions: Discussed common feeling and emotions when being diagnosed with cancer, and the importance of support during treatment Informed patient of the support team roles and support services at [facility] Provided [facility] contact information and encouraged patient to call with any questions or concerns   Follow Up Plan: Patient will contact CSW with any support or resource needs Patient verbalizes understanding of plan: Yes    Katee Wentland E Dock Baccam, LCSW

## 2022-04-12 ENCOUNTER — Encounter (HOSPITAL_COMMUNITY): Payer: Self-pay

## 2022-04-19 ENCOUNTER — Encounter: Payer: Self-pay | Admitting: Internal Medicine

## 2022-04-19 ENCOUNTER — Ambulatory Visit: Payer: Medicare PPO | Admitting: Internal Medicine

## 2022-04-19 VITALS — BP 130/80 | HR 60 | Temp 97.7°F | Wt 211.1 lb

## 2022-04-19 DIAGNOSIS — E038 Other specified hypothyroidism: Secondary | ICD-10-CM | POA: Diagnosis not present

## 2022-04-19 DIAGNOSIS — I10 Essential (primary) hypertension: Secondary | ICD-10-CM

## 2022-04-19 DIAGNOSIS — R7302 Impaired glucose tolerance (oral): Secondary | ICD-10-CM | POA: Diagnosis not present

## 2022-04-19 DIAGNOSIS — F411 Generalized anxiety disorder: Secondary | ICD-10-CM | POA: Diagnosis not present

## 2022-04-19 DIAGNOSIS — Z170 Estrogen receptor positive status [ER+]: Secondary | ICD-10-CM

## 2022-04-19 DIAGNOSIS — C50512 Malignant neoplasm of lower-outer quadrant of left female breast: Secondary | ICD-10-CM | POA: Diagnosis not present

## 2022-04-19 DIAGNOSIS — E7800 Pure hypercholesterolemia, unspecified: Secondary | ICD-10-CM | POA: Diagnosis not present

## 2022-04-19 LAB — POCT GLYCOSYLATED HEMOGLOBIN (HGB A1C): Hemoglobin A1C: 5.5 % (ref 4.0–5.6)

## 2022-04-19 NOTE — Progress Notes (Signed)
Established Patient Office Visit     CC/Reason for Visit: 37-monthfollow-up chronic medical conditions  HPI: Karina Allen a 71y.o. female who is coming in today for the above mentioned reasons. Past Medical History is significant for: Hypertension, hyperlipidemia, impaired glucose tolerance, morbid obesity.  Since I last saw her she was diagnosed with left breast DCIS after screening mammogram showed some calcifications.  She has already had her lumpectomy and is scheduled to initiate radiation at the end of the month.  She is still debating whether she will go on an antiestrogen medication.  She is otherwise feeling well.   Past Medical/Surgical History: Past Medical History:  Diagnosis Date   Arthritis    osteo   Breast cancer ([place] 03/20/2022   Depression    Epiretinal membrane 02/05/2017   Sees Dr. BValetta Closeand Dr. HIona Allen  Essential hypertension 04/27/2014   FH: migraines    GAD (generalized anxiety disorder) 08/04/2015   Hepatitis B    reports dx and tx as a young child, reports no issues since and retested as adult and told did not have hepatitis, she is unsure of type of hep she had   Thyroid disease     Past Surgical History:  Procedure Laterality Date   APPENDECTOMY     BREAST LUMPECTOMY WITH RADIOACTIVE SEED LOCALIZATION Left 03/20/2022   Procedure: LEFT BREAST LUMPECTOMY WITH RADIOACTIVE SEED LOCALIZATION;  Surgeon: Karina Mesa MD;  [place]  Service: General;  Laterality: Left;   CATARACT EXTRACTION     CHOLECYSTECTOMY     ELBOW SURGERY     shattered elbow   endometrial oblation     FOOT SURGERY     RETINAL DETACHMENT SURGERY     REVERSE SHOULDER ARTHROPLASTY Left 09/20/2021   Procedure: LEFT REVERSE SHOULDER ARTHROPLASTY;  Surgeon: Karina Gash MD;  Location[facility]  Service: Orthopedics;  Laterality: Left;   TONSILLECTOMY AND ADENOIDECTOMY     TUBAL LIGATION      Social History:  reports that she has never smoked.  She has never used smokeless tobacco. She reports that she does not currently use alcohol. She reports that she does not use drugs.  Allergies: Allergies  Allergen Reactions   Penicillins Hives    Childhood Reaction  Received Ancef on 03-20-2022 without issue    Family History:  Family History  Problem Relation Age of Onset   Testicular cancer Brother      Current Outpatient Medications:    carboxymethylcellulose (REFRESH PLUS) 0.5 % SOLN, Place 1 drop into both eyes 3 (three) times daily as needed (dry eyes)., Disp: , Rfl:    Cholecalciferol (SM VITAMIN D3) 100 MCG (4000 UT) CAPS, Take 1 capsule by mouth daily. DV3 + Immune Support, Disp: , Rfl:    hydrochlorothiazide (HYDRODIURIL) 25 MG tablet, Take 1 tablet (25 mg total) by mouth daily., Disp: 90 tablet, Rfl: 1   levothyroxine (SYNTHROID) 175 MCG tablet, Take 1 tablet (175 mcg total) by mouth daily with breakfast., Disp: 90 tablet, Rfl: 1   NON FORMULARY, Take 1 capsule by mouth daily. Doterra  Food Nutrient Complex, Disp: , Rfl:    olmesartan (BENICAR) 5 MG tablet, Take 2 tablets (10 mg total) by mouth daily., Disp: 180 tablet, Rfl: 1   tamoxifen (NOLVADEX) 20 MG tablet, Take 1 tablet (20 mg total) by mouth daily., Disp: 90 tablet, Rfl: 1  Review of Systems:  Constitutional: Denies fever, chills, diaphoresis, appetite change and  fatigue.  HEENT: Denies photophobia, eye pain, redness, hearing loss, ear pain, congestion, sore throat, rhinorrhea, sneezing, mouth sores, trouble swallowing, neck pain, neck stiffness and tinnitus.   Respiratory: Denies SOB, DOE, cough, chest tightness,  and wheezing.   Cardiovascular: Denies chest pain, palpitations and leg swelling.  Gastrointestinal: Denies nausea, vomiting, abdominal pain, diarrhea, constipation, blood in stool and abdominal distention.  Genitourinary: Denies dysuria, urgency, frequency, hematuria, flank pain and difficulty urinating.  Endocrine: Denies: hot or cold intolerance,  sweats, changes in hair or nails, polyuria, polydipsia. Musculoskeletal: Denies myalgias, back pain, joint swelling, arthralgias and gait problem.  Skin: Denies pallor, rash and wound.  Neurological: Denies dizziness, seizures, syncope, weakness, light-headedness, numbness and headaches.  Hematological: Denies adenopathy. Easy bruising, personal or family bleeding history  Psychiatric/Behavioral: Denies suicidal ideation, mood changes, confusion, nervousness, sleep disturbance and agitation    Physical Exam: Vitals:   04/19/22 0802  BP: 130/80  Pulse: 60  Temp: 97.7 F (36.5 C)  TempSrc: Oral  SpO2: 98%  Weight: 211 lb 1.6 oz (95.8 kg)    Body mass index is 35.68 kg/m.   Constitutional: NAD, calm, comfortable Eyes: PERRL, lids and conjunctivae normal, wears corrective lenses ENMT: Mucous membranes are moist.  Respiratory: clear to auscultation bilaterally, no wheezing, no crackles. Normal respiratory effort. No accessory muscle use.  Cardiovascular: Regular rate and rhythm, no murmurs / rubs / gallops. No extremity edema.  Neurologic: Grossly intact and nonfocal Psychiatric: Normal judgment and insight. Alert and oriented x 3. Normal mood.    Impression and Plan:  IGT (impaired glucose tolerance)  - Plan: POCT glycosylated hemoglobin (Hb A1C) -A1c remains stable at 5.5, continue lifestyle changes.  Malignant neoplasm of lower-outer quadrant of left breast of female, estrogen receptor positive ([name]) -New diagnosis since I last saw her, she had breast conservation surgery, she is scheduled for radiation at the end of the month.  She has not yet started tamoxifen, she will rediscuss with oncology.  Morbid obesity ([name]) -Discussed healthy lifestyle, including increased physical activity and better food choices to promote weight loss.  Other specified hypothyroidism -Last TSH was within range on 175 mcg of levothyroxine.  Essential hypertension -Well-controlled on  hydrochlorothiazide and olmesartan.  Pure hypercholesterolemia -Not on statins, last LDL was 97.  GAD (generalized anxiety disorder) [name] Office Visit from 10/20/2021 in [place] at [place]  PHQ-9 Total Score 0      -Mood is stable, not on medication    Time spent:34 minutes reviewing chart, interviewing and examining patient and formulating plan of care.    Lelon Frohlich, MD [facility]

## 2022-04-20 ENCOUNTER — Other Ambulatory Visit: Payer: Self-pay

## 2022-04-20 ENCOUNTER — Ambulatory Visit
Admission: RE | Admit: 2022-04-20 | Discharge: 2022-04-20 | Disposition: A | Discharge status: Home / Self Care | Payer: Medicare PPO | Source: Ambulatory Visit | Attending: Radiation Oncology | Admitting: Radiation Oncology

## 2022-04-20 DIAGNOSIS — Z510 Encounter for antineoplastic radiation therapy: Secondary | ICD-10-CM | POA: Insufficient documentation

## 2022-04-20 DIAGNOSIS — C50512 Malignant neoplasm of lower-outer quadrant of left female breast: Secondary | ICD-10-CM | POA: Diagnosis not present

## 2022-04-20 DIAGNOSIS — Z170 Estrogen receptor positive status [ER+]: Secondary | ICD-10-CM | POA: Diagnosis not present

## 2022-04-25 DIAGNOSIS — Z510 Encounter for antineoplastic radiation therapy: Secondary | ICD-10-CM | POA: Diagnosis not present

## 2022-04-25 DIAGNOSIS — C50512 Malignant neoplasm of lower-outer quadrant of left female breast: Secondary | ICD-10-CM | POA: Diagnosis not present

## 2022-04-25 DIAGNOSIS — Z170 Estrogen receptor positive status [ER+]: Secondary | ICD-10-CM | POA: Diagnosis not present

## 2022-05-01 ENCOUNTER — Other Ambulatory Visit: Payer: Self-pay | Admitting: Internal Medicine

## 2022-05-01 DIAGNOSIS — E038 Other specified hypothyroidism: Secondary | ICD-10-CM

## 2022-05-01 DIAGNOSIS — I10 Essential (primary) hypertension: Secondary | ICD-10-CM

## 2022-05-03 ENCOUNTER — Ambulatory Visit
Admission: RE | Admit: 2022-05-03 | Discharge: 2022-05-03 | Disposition: A | Discharge status: Home / Self Care | Payer: Medicare PPO | Source: Ambulatory Visit | Attending: Radiation Oncology | Admitting: Radiation Oncology

## 2022-05-03 ENCOUNTER — Other Ambulatory Visit: Payer: Self-pay

## 2022-05-03 DIAGNOSIS — Z510 Encounter for antineoplastic radiation therapy: Secondary | ICD-10-CM | POA: Diagnosis not present

## 2022-05-03 DIAGNOSIS — Z170 Estrogen receptor positive status [ER+]: Secondary | ICD-10-CM | POA: Diagnosis not present

## 2022-05-03 DIAGNOSIS — C50512 Malignant neoplasm of lower-outer quadrant of left female breast: Secondary | ICD-10-CM | POA: Diagnosis not present

## 2022-05-03 LAB — RAD ONC ARIA SESSION SUMMARY
Course Elapsed Days: 0
Plan Fractions Treated to Date: 1
Plan Prescribed Dose Per Fraction: 2.66 Gy
Plan Total Fractions Prescribed: 16
Plan Total Prescribed Dose: 42.56 Gy
Reference Point Dosage Given to Date: 2.66 Gy
Reference Point Session Dosage Given: 2.66 Gy
Session Number: 1

## 2022-05-04 ENCOUNTER — Other Ambulatory Visit: Payer: Self-pay

## 2022-05-04 ENCOUNTER — Ambulatory Visit
Admission: RE | Admit: 2022-05-04 | Discharge: 2022-05-04 | Disposition: A | Discharge status: Home / Self Care | Payer: Medicare PPO | Source: Ambulatory Visit | Attending: Radiation Oncology | Admitting: Radiation Oncology

## 2022-05-04 DIAGNOSIS — C50512 Malignant neoplasm of lower-outer quadrant of left female breast: Secondary | ICD-10-CM | POA: Diagnosis not present

## 2022-05-04 DIAGNOSIS — Z170 Estrogen receptor positive status [ER+]: Secondary | ICD-10-CM | POA: Diagnosis not present

## 2022-05-04 DIAGNOSIS — Z510 Encounter for antineoplastic radiation therapy: Secondary | ICD-10-CM | POA: Diagnosis not present

## 2022-05-04 LAB — RAD ONC ARIA SESSION SUMMARY
Course Elapsed Days: 1
Plan Fractions Treated to Date: 2
Plan Prescribed Dose Per Fraction: 2.66 Gy
Plan Total Fractions Prescribed: 16
Plan Total Prescribed Dose: 42.56 Gy
Reference Point Dosage Given to Date: 5.32 Gy
Reference Point Session Dosage Given: 2.66 Gy
Session Number: 2

## 2022-05-04 MED ORDER — ALRA NON-METALLIC DEODORANT (RAD-ONC)
1.0000 | Freq: Once | TOPICAL | Status: AC
  Administered 2022-05-04: 1 via TOPICAL

## 2022-05-04 MED ORDER — RADIAPLEXRX EX GEL: Freq: Once | CUTANEOUS | Status: AC

## 2022-05-04 NOTE — Progress Notes (Signed)
Pt here for patient teaching.  Pt given Radiation and You booklet, skin care instructions, alra deodorant and Radiaplex.    Reviewed areas of pertinence such as fatigue, hair loss, skin changes, breast tenderness, and breast swelling. Pt able to give teach back of to pat skin and use unscented/gentle soap,apply Radiaplex bid, avoid applying anything to skin within 4 hours of treatment, avoid wearing an under wire bra, and to use an electric razor if they must shave. Pt verbalizes understanding of information given and will contact nursing with any questions or concerns.    Tanajah Boulter M. Brantleigh Mifflin RN, BSN  

## 2022-05-07 ENCOUNTER — Ambulatory Visit
Admission: RE | Admit: 2022-05-07 | Discharge: 2022-05-07 | Disposition: A | Discharge status: Home / Self Care | Payer: Medicare PPO | Source: Ambulatory Visit | Attending: Radiation Oncology | Admitting: Radiation Oncology

## 2022-05-07 ENCOUNTER — Other Ambulatory Visit: Payer: Self-pay

## 2022-05-07 DIAGNOSIS — Z170 Estrogen receptor positive status [ER+]: Secondary | ICD-10-CM | POA: Insufficient documentation

## 2022-05-07 DIAGNOSIS — Z510 Encounter for antineoplastic radiation therapy: Secondary | ICD-10-CM | POA: Insufficient documentation

## 2022-05-07 DIAGNOSIS — C50512 Malignant neoplasm of lower-outer quadrant of left female breast: Secondary | ICD-10-CM | POA: Diagnosis not present

## 2022-05-07 LAB — RAD ONC ARIA SESSION SUMMARY
Course Elapsed Days: 4
Plan Fractions Treated to Date: 3
Plan Prescribed Dose Per Fraction: 2.66 Gy
Plan Total Fractions Prescribed: 16
Plan Total Prescribed Dose: 42.56 Gy
Reference Point Dosage Given to Date: 7.98 Gy
Reference Point Session Dosage Given: 2.66 Gy
Session Number: 3

## 2022-05-09 ENCOUNTER — Other Ambulatory Visit: Payer: Self-pay

## 2022-05-09 ENCOUNTER — Ambulatory Visit
Admission: RE | Admit: 2022-05-09 | Discharge: 2022-05-09 | Disposition: A | Discharge status: Home / Self Care | Payer: Medicare PPO | Source: Ambulatory Visit | Attending: Radiation Oncology | Admitting: Radiation Oncology

## 2022-05-09 DIAGNOSIS — Z170 Estrogen receptor positive status [ER+]: Secondary | ICD-10-CM | POA: Diagnosis not present

## 2022-05-09 DIAGNOSIS — Z510 Encounter for antineoplastic radiation therapy: Secondary | ICD-10-CM | POA: Diagnosis not present

## 2022-05-09 DIAGNOSIS — C50512 Malignant neoplasm of lower-outer quadrant of left female breast: Secondary | ICD-10-CM | POA: Diagnosis not present

## 2022-05-09 LAB — RAD ONC ARIA SESSION SUMMARY
Course Elapsed Days: 6
Plan Fractions Treated to Date: 4
Plan Prescribed Dose Per Fraction: 2.66 Gy
Plan Total Fractions Prescribed: 16
Plan Total Prescribed Dose: 42.56 Gy
Reference Point Dosage Given to Date: 10.64 Gy
Reference Point Session Dosage Given: 2.66 Gy
Session Number: 4

## 2022-05-10 ENCOUNTER — Ambulatory Visit
Admission: RE | Admit: 2022-05-10 | Discharge: 2022-05-10 | Disposition: A | Discharge status: Home / Self Care | Payer: Medicare PPO | Source: Ambulatory Visit | Attending: Radiation Oncology | Admitting: Radiation Oncology

## 2022-05-10 ENCOUNTER — Other Ambulatory Visit: Payer: Self-pay

## 2022-05-10 DIAGNOSIS — Z170 Estrogen receptor positive status [ER+]: Secondary | ICD-10-CM | POA: Diagnosis not present

## 2022-05-10 DIAGNOSIS — C50512 Malignant neoplasm of lower-outer quadrant of left female breast: Secondary | ICD-10-CM | POA: Diagnosis not present

## 2022-05-10 DIAGNOSIS — Z510 Encounter for antineoplastic radiation therapy: Secondary | ICD-10-CM | POA: Diagnosis not present

## 2022-05-10 LAB — RAD ONC ARIA SESSION SUMMARY
Course Elapsed Days: 7
Plan Fractions Treated to Date: 5
Plan Prescribed Dose Per Fraction: 2.66 Gy
Plan Total Fractions Prescribed: 16
Plan Total Prescribed Dose: 42.56 Gy
Reference Point Dosage Given to Date: 13.3 Gy
Reference Point Session Dosage Given: 2.66 Gy
Session Number: 5

## 2022-05-11 ENCOUNTER — Ambulatory Visit
Admission: RE | Admit: 2022-05-11 | Discharge: 2022-05-11 | Disposition: A | Discharge status: Home / Self Care | Payer: Medicare PPO | Source: Ambulatory Visit | Attending: Radiation Oncology | Admitting: Radiation Oncology

## 2022-05-11 ENCOUNTER — Other Ambulatory Visit: Payer: Self-pay

## 2022-05-11 ENCOUNTER — Telehealth: Payer: Self-pay | Admitting: *Deleted

## 2022-05-11 DIAGNOSIS — Z510 Encounter for antineoplastic radiation therapy: Secondary | ICD-10-CM | POA: Diagnosis not present

## 2022-05-11 DIAGNOSIS — C50512 Malignant neoplasm of lower-outer quadrant of left female breast: Secondary | ICD-10-CM | POA: Diagnosis not present

## 2022-05-11 DIAGNOSIS — Z170 Estrogen receptor positive status [ER+]: Secondary | ICD-10-CM | POA: Diagnosis not present

## 2022-05-11 LAB — RAD ONC ARIA SESSION SUMMARY
Course Elapsed Days: 8
Plan Fractions Treated to Date: 6
Plan Prescribed Dose Per Fraction: 2.66 Gy
Plan Total Fractions Prescribed: 16
Plan Total Prescribed Dose: 42.56 Gy
Reference Point Dosage Given to Date: 15.96 Gy
Reference Point Session Dosage Given: 2.66 Gy
Session Number: 6

## 2022-05-11 NOTE — Telephone Encounter (Signed)
Per pt discussing with Rad Onc nurse concerns about starting antiestrogen therapy post radiation- pt stated she would like to be seen before starting it in August.  This RN sent a scheduling request per above and left pt a message on her identified VM

## 2022-05-14 ENCOUNTER — Other Ambulatory Visit: Payer: Self-pay

## 2022-05-14 ENCOUNTER — Ambulatory Visit
Admission: RE | Admit: 2022-05-14 | Discharge: 2022-05-14 | Disposition: A | Discharge status: Home / Self Care | Payer: Medicare PPO | Source: Ambulatory Visit | Attending: Radiation Oncology | Admitting: Radiation Oncology

## 2022-05-14 DIAGNOSIS — Z170 Estrogen receptor positive status [ER+]: Secondary | ICD-10-CM | POA: Diagnosis not present

## 2022-05-14 DIAGNOSIS — C50512 Malignant neoplasm of lower-outer quadrant of left female breast: Secondary | ICD-10-CM | POA: Diagnosis not present

## 2022-05-14 DIAGNOSIS — Z510 Encounter for antineoplastic radiation therapy: Secondary | ICD-10-CM | POA: Diagnosis not present

## 2022-05-14 LAB — RAD ONC ARIA SESSION SUMMARY
Course Elapsed Days: 11
Plan Fractions Treated to Date: 7
Plan Prescribed Dose Per Fraction: 2.66 Gy
Plan Total Fractions Prescribed: 16
Plan Total Prescribed Dose: 42.56 Gy
Reference Point Dosage Given to Date: 18.62 Gy
Reference Point Session Dosage Given: 2.66 Gy
Session Number: 7

## 2022-05-15 ENCOUNTER — Other Ambulatory Visit: Payer: Self-pay

## 2022-05-15 ENCOUNTER — Ambulatory Visit
Admission: RE | Admit: 2022-05-15 | Discharge: 2022-05-15 | Disposition: A | Discharge status: Home / Self Care | Payer: Medicare PPO | Source: Ambulatory Visit | Attending: Radiation Oncology | Admitting: Radiation Oncology

## 2022-05-15 DIAGNOSIS — C50512 Malignant neoplasm of lower-outer quadrant of left female breast: Secondary | ICD-10-CM | POA: Diagnosis not present

## 2022-05-15 DIAGNOSIS — Z510 Encounter for antineoplastic radiation therapy: Secondary | ICD-10-CM | POA: Diagnosis not present

## 2022-05-15 DIAGNOSIS — Z170 Estrogen receptor positive status [ER+]: Secondary | ICD-10-CM | POA: Diagnosis not present

## 2022-05-15 LAB — RAD ONC ARIA SESSION SUMMARY
Course Elapsed Days: 12
Plan Fractions Treated to Date: 8
Plan Prescribed Dose Per Fraction: 2.66 Gy
Plan Total Fractions Prescribed: 16
Plan Total Prescribed Dose: 42.56 Gy
Reference Point Dosage Given to Date: 21.28 Gy
Reference Point Session Dosage Given: 2.66 Gy
Session Number: 8

## 2022-05-16 ENCOUNTER — Other Ambulatory Visit: Payer: Self-pay

## 2022-05-16 ENCOUNTER — Ambulatory Visit
Admission: RE | Admit: 2022-05-16 | Discharge: 2022-05-16 | Disposition: A | Discharge status: Home / Self Care | Payer: Medicare PPO | Source: Ambulatory Visit | Attending: Radiation Oncology | Admitting: Radiation Oncology

## 2022-05-16 DIAGNOSIS — Z170 Estrogen receptor positive status [ER+]: Secondary | ICD-10-CM | POA: Diagnosis not present

## 2022-05-16 DIAGNOSIS — Z510 Encounter for antineoplastic radiation therapy: Secondary | ICD-10-CM | POA: Diagnosis not present

## 2022-05-16 DIAGNOSIS — C50512 Malignant neoplasm of lower-outer quadrant of left female breast: Secondary | ICD-10-CM | POA: Diagnosis not present

## 2022-05-16 LAB — RAD ONC ARIA SESSION SUMMARY
Course Elapsed Days: 13
Plan Fractions Treated to Date: 9
Plan Prescribed Dose Per Fraction: 2.66 Gy
Plan Total Fractions Prescribed: 16
Plan Total Prescribed Dose: 42.56 Gy
Reference Point Dosage Given to Date: 23.94 Gy
Reference Point Session Dosage Given: 2.66 Gy
Session Number: 9

## 2022-05-17 ENCOUNTER — Ambulatory Visit
Admission: RE | Admit: 2022-05-17 | Discharge: 2022-05-17 | Disposition: A | Discharge status: Home / Self Care | Payer: Medicare PPO | Source: Ambulatory Visit | Attending: Radiation Oncology | Admitting: Radiation Oncology

## 2022-05-17 ENCOUNTER — Telehealth: Payer: Self-pay | Admitting: Hematology and Oncology

## 2022-05-17 ENCOUNTER — Other Ambulatory Visit: Payer: Self-pay

## 2022-05-17 DIAGNOSIS — C50512 Malignant neoplasm of lower-outer quadrant of left female breast: Secondary | ICD-10-CM | POA: Diagnosis not present

## 2022-05-17 DIAGNOSIS — Z170 Estrogen receptor positive status [ER+]: Secondary | ICD-10-CM | POA: Diagnosis not present

## 2022-05-17 DIAGNOSIS — Z510 Encounter for antineoplastic radiation therapy: Secondary | ICD-10-CM | POA: Diagnosis not present

## 2022-05-17 LAB — RAD ONC ARIA SESSION SUMMARY
Course Elapsed Days: 14
Plan Fractions Treated to Date: 10
Plan Prescribed Dose Per Fraction: 2.66 Gy
Plan Total Fractions Prescribed: 16
Plan Total Prescribed Dose: 42.56 Gy
Reference Point Dosage Given to Date: 26.6 Gy
Reference Point Session Dosage Given: 2.66 Gy
Session Number: 10

## 2022-05-17 NOTE — Telephone Encounter (Signed)
.  Called patient to schedule appointment per 7/12 inbasket, patient is aware of date and time.   

## 2022-05-18 ENCOUNTER — Ambulatory Visit
Admission: RE | Admit: 2022-05-18 | Discharge: 2022-05-18 | Disposition: A | Discharge status: Home / Self Care | Payer: Medicare PPO | Source: Ambulatory Visit | Attending: Radiation Oncology | Admitting: Radiation Oncology

## 2022-05-18 ENCOUNTER — Other Ambulatory Visit: Payer: Self-pay

## 2022-05-18 DIAGNOSIS — Z510 Encounter for antineoplastic radiation therapy: Secondary | ICD-10-CM | POA: Diagnosis not present

## 2022-05-18 DIAGNOSIS — Z170 Estrogen receptor positive status [ER+]: Secondary | ICD-10-CM | POA: Diagnosis not present

## 2022-05-18 DIAGNOSIS — C50512 Malignant neoplasm of lower-outer quadrant of left female breast: Secondary | ICD-10-CM | POA: Diagnosis not present

## 2022-05-18 LAB — RAD ONC ARIA SESSION SUMMARY
Course Elapsed Days: 15
Plan Fractions Treated to Date: 11
Plan Prescribed Dose Per Fraction: 2.66 Gy
Plan Total Fractions Prescribed: 16
Plan Total Prescribed Dose: 42.56 Gy
Reference Point Dosage Given to Date: 29.26 Gy
Reference Point Session Dosage Given: 2.66 Gy
Session Number: 11

## 2022-05-21 ENCOUNTER — Other Ambulatory Visit: Payer: Self-pay

## 2022-05-21 ENCOUNTER — Ambulatory Visit
Admission: RE | Admit: 2022-05-21 | Discharge: 2022-05-21 | Disposition: A | Discharge status: Home / Self Care | Payer: Medicare PPO | Source: Ambulatory Visit | Attending: Radiation Oncology | Admitting: Radiation Oncology

## 2022-05-21 DIAGNOSIS — Z510 Encounter for antineoplastic radiation therapy: Secondary | ICD-10-CM | POA: Diagnosis not present

## 2022-05-21 DIAGNOSIS — Z170 Estrogen receptor positive status [ER+]: Secondary | ICD-10-CM | POA: Diagnosis not present

## 2022-05-21 DIAGNOSIS — C50512 Malignant neoplasm of lower-outer quadrant of left female breast: Secondary | ICD-10-CM | POA: Diagnosis not present

## 2022-05-21 LAB — RAD ONC ARIA SESSION SUMMARY
Course Elapsed Days: 18
Plan Fractions Treated to Date: 12
Plan Prescribed Dose Per Fraction: 2.66 Gy
Plan Total Fractions Prescribed: 16
Plan Total Prescribed Dose: 42.56 Gy
Reference Point Dosage Given to Date: 31.92 Gy
Reference Point Session Dosage Given: 2.66 Gy
Session Number: 12

## 2022-05-22 ENCOUNTER — Ambulatory Visit
Admission: RE | Admit: 2022-05-22 | Discharge: 2022-05-22 | Disposition: A | Discharge status: Home / Self Care | Payer: Medicare PPO | Source: Ambulatory Visit | Attending: Radiation Oncology | Admitting: Radiation Oncology

## 2022-05-22 ENCOUNTER — Other Ambulatory Visit: Payer: Self-pay

## 2022-05-22 DIAGNOSIS — C50512 Malignant neoplasm of lower-outer quadrant of left female breast: Secondary | ICD-10-CM | POA: Diagnosis not present

## 2022-05-22 DIAGNOSIS — Z170 Estrogen receptor positive status [ER+]: Secondary | ICD-10-CM | POA: Diagnosis not present

## 2022-05-22 DIAGNOSIS — Z510 Encounter for antineoplastic radiation therapy: Secondary | ICD-10-CM | POA: Diagnosis not present

## 2022-05-22 LAB — RAD ONC ARIA SESSION SUMMARY
Course Elapsed Days: 19
Plan Fractions Treated to Date: 13
Plan Prescribed Dose Per Fraction: 2.66 Gy
Plan Total Fractions Prescribed: 16
Plan Total Prescribed Dose: 42.56 Gy
Reference Point Dosage Given to Date: 34.58 Gy
Reference Point Session Dosage Given: 2.66 Gy
Session Number: 13

## 2022-05-23 ENCOUNTER — Other Ambulatory Visit: Payer: Self-pay

## 2022-05-23 ENCOUNTER — Ambulatory Visit
Admission: RE | Admit: 2022-05-23 | Discharge: 2022-05-23 | Disposition: A | Discharge status: Home / Self Care | Payer: Medicare PPO | Source: Ambulatory Visit | Attending: Radiation Oncology | Admitting: Radiation Oncology

## 2022-05-23 DIAGNOSIS — C50512 Malignant neoplasm of lower-outer quadrant of left female breast: Secondary | ICD-10-CM | POA: Diagnosis not present

## 2022-05-23 DIAGNOSIS — Z170 Estrogen receptor positive status [ER+]: Secondary | ICD-10-CM | POA: Diagnosis not present

## 2022-05-23 DIAGNOSIS — Z510 Encounter for antineoplastic radiation therapy: Secondary | ICD-10-CM | POA: Diagnosis not present

## 2022-05-23 LAB — RAD ONC ARIA SESSION SUMMARY
Course Elapsed Days: 20
Plan Fractions Treated to Date: 14
Plan Prescribed Dose Per Fraction: 2.66 Gy
Plan Total Fractions Prescribed: 16
Plan Total Prescribed Dose: 42.56 Gy
Reference Point Dosage Given to Date: 37.24 Gy
Reference Point Session Dosage Given: 2.66 Gy
Session Number: 14

## 2022-05-24 ENCOUNTER — Other Ambulatory Visit: Payer: Self-pay

## 2022-05-24 ENCOUNTER — Ambulatory Visit
Admission: RE | Admit: 2022-05-24 | Discharge: 2022-05-24 | Disposition: A | Discharge status: Home / Self Care | Payer: Medicare PPO | Source: Ambulatory Visit | Attending: Radiation Oncology | Admitting: Radiation Oncology

## 2022-05-24 DIAGNOSIS — Z510 Encounter for antineoplastic radiation therapy: Secondary | ICD-10-CM | POA: Diagnosis not present

## 2022-05-24 DIAGNOSIS — Z170 Estrogen receptor positive status [ER+]: Secondary | ICD-10-CM | POA: Diagnosis not present

## 2022-05-24 DIAGNOSIS — C50512 Malignant neoplasm of lower-outer quadrant of left female breast: Secondary | ICD-10-CM | POA: Diagnosis not present

## 2022-05-24 LAB — RAD ONC ARIA SESSION SUMMARY
Course Elapsed Days: 21
Plan Fractions Treated to Date: 15
Plan Prescribed Dose Per Fraction: 2.66 Gy
Plan Total Fractions Prescribed: 16
Plan Total Prescribed Dose: 42.56 Gy
Reference Point Dosage Given to Date: 39.9 Gy
Reference Point Session Dosage Given: 2.66 Gy
Session Number: 15

## 2022-05-25 ENCOUNTER — Other Ambulatory Visit: Payer: Self-pay

## 2022-05-25 ENCOUNTER — Ambulatory Visit
Admission: RE | Admit: 2022-05-25 | Discharge: 2022-05-25 | Disposition: A | Discharge status: Home / Self Care | Payer: Medicare PPO | Source: Ambulatory Visit | Attending: Radiation Oncology | Admitting: Radiation Oncology

## 2022-05-25 ENCOUNTER — Ambulatory Visit: Payer: Medicare PPO | Admitting: Radiation Oncology

## 2022-05-25 DIAGNOSIS — C50512 Malignant neoplasm of lower-outer quadrant of left female breast: Secondary | ICD-10-CM | POA: Diagnosis not present

## 2022-05-25 DIAGNOSIS — Z170 Estrogen receptor positive status [ER+]: Secondary | ICD-10-CM | POA: Diagnosis not present

## 2022-05-25 DIAGNOSIS — Z510 Encounter for antineoplastic radiation therapy: Secondary | ICD-10-CM | POA: Diagnosis not present

## 2022-05-25 LAB — RAD ONC ARIA SESSION SUMMARY
Course Elapsed Days: 22
Plan Fractions Treated to Date: 16
Plan Prescribed Dose Per Fraction: 2.66 Gy
Plan Total Fractions Prescribed: 16
Plan Total Prescribed Dose: 42.56 Gy
Reference Point Dosage Given to Date: 42.56 Gy
Reference Point Session Dosage Given: 2.66 Gy
Session Number: 16

## 2022-05-28 ENCOUNTER — Other Ambulatory Visit: Payer: Self-pay

## 2022-05-28 ENCOUNTER — Ambulatory Visit
Admission: RE | Admit: 2022-05-28 | Discharge: 2022-05-28 | Disposition: A | Discharge status: Home / Self Care | Payer: Medicare PPO | Source: Ambulatory Visit | Attending: Radiation Oncology | Admitting: Radiation Oncology

## 2022-05-28 DIAGNOSIS — C50512 Malignant neoplasm of lower-outer quadrant of left female breast: Secondary | ICD-10-CM | POA: Diagnosis not present

## 2022-05-28 DIAGNOSIS — Z170 Estrogen receptor positive status [ER+]: Secondary | ICD-10-CM | POA: Diagnosis not present

## 2022-05-28 DIAGNOSIS — Z510 Encounter for antineoplastic radiation therapy: Secondary | ICD-10-CM | POA: Diagnosis not present

## 2022-05-28 LAB — RAD ONC ARIA SESSION SUMMARY
Course Elapsed Days: 25
Plan Fractions Treated to Date: 1
Plan Prescribed Dose Per Fraction: 2.5 Gy
Plan Total Fractions Prescribed: 4
Plan Total Prescribed Dose: 10 Gy
Reference Point Dosage Given to Date: 45.06 Gy
Reference Point Session Dosage Given: 2.5 Gy
Session Number: 17

## 2022-05-29 ENCOUNTER — Ambulatory Visit
Admission: RE | Admit: 2022-05-29 | Discharge: 2022-05-29 | Disposition: A | Discharge status: Home / Self Care | Payer: Medicare PPO | Source: Ambulatory Visit | Attending: Radiation Oncology | Admitting: Radiation Oncology

## 2022-05-29 ENCOUNTER — Other Ambulatory Visit: Payer: Self-pay

## 2022-05-29 DIAGNOSIS — Z510 Encounter for antineoplastic radiation therapy: Secondary | ICD-10-CM | POA: Diagnosis not present

## 2022-05-29 DIAGNOSIS — C50512 Malignant neoplasm of lower-outer quadrant of left female breast: Secondary | ICD-10-CM | POA: Diagnosis not present

## 2022-05-29 DIAGNOSIS — Z170 Estrogen receptor positive status [ER+]: Secondary | ICD-10-CM | POA: Diagnosis not present

## 2022-05-29 LAB — RAD ONC ARIA SESSION SUMMARY
Course Elapsed Days: 26
Plan Fractions Treated to Date: 2
Plan Prescribed Dose Per Fraction: 2.5 Gy
Plan Total Fractions Prescribed: 4
Plan Total Prescribed Dose: 10 Gy
Reference Point Dosage Given to Date: 47.56 Gy
Reference Point Session Dosage Given: 2.5 Gy
Session Number: 18

## 2022-05-30 ENCOUNTER — Other Ambulatory Visit: Payer: Self-pay

## 2022-05-30 ENCOUNTER — Ambulatory Visit
Admission: RE | Admit: 2022-05-30 | Discharge: 2022-05-30 | Disposition: A | Discharge status: Home / Self Care | Payer: Medicare PPO | Source: Ambulatory Visit | Attending: Radiation Oncology | Admitting: Radiation Oncology

## 2022-05-30 DIAGNOSIS — Z510 Encounter for antineoplastic radiation therapy: Secondary | ICD-10-CM | POA: Diagnosis not present

## 2022-05-30 DIAGNOSIS — Z170 Estrogen receptor positive status [ER+]: Secondary | ICD-10-CM | POA: Diagnosis not present

## 2022-05-30 DIAGNOSIS — C50512 Malignant neoplasm of lower-outer quadrant of left female breast: Secondary | ICD-10-CM | POA: Diagnosis not present

## 2022-05-30 LAB — RAD ONC ARIA SESSION SUMMARY
Course Elapsed Days: 27
Plan Fractions Treated to Date: 3
Plan Prescribed Dose Per Fraction: 2.5 Gy
Plan Total Fractions Prescribed: 4
Plan Total Prescribed Dose: 10 Gy
Reference Point Dosage Given to Date: 50.06 Gy
Reference Point Session Dosage Given: 2.5 Gy
Session Number: 19

## 2022-05-31 ENCOUNTER — Encounter: Payer: Self-pay | Admitting: *Deleted

## 2022-05-31 ENCOUNTER — Ambulatory Visit
Admission: RE | Admit: 2022-05-31 | Discharge: 2022-05-31 | Disposition: A | Discharge status: Home / Self Care | Payer: Medicare PPO | Source: Ambulatory Visit | Attending: Radiation Oncology | Admitting: Radiation Oncology

## 2022-05-31 ENCOUNTER — Other Ambulatory Visit: Payer: Self-pay

## 2022-05-31 ENCOUNTER — Encounter: Payer: Self-pay | Admitting: Radiation Oncology

## 2022-05-31 DIAGNOSIS — Z510 Encounter for antineoplastic radiation therapy: Secondary | ICD-10-CM | POA: Diagnosis not present

## 2022-05-31 DIAGNOSIS — Z170 Estrogen receptor positive status [ER+]: Secondary | ICD-10-CM | POA: Diagnosis not present

## 2022-05-31 DIAGNOSIS — C50512 Malignant neoplasm of lower-outer quadrant of left female breast: Secondary | ICD-10-CM | POA: Diagnosis not present

## 2022-05-31 LAB — RAD ONC ARIA SESSION SUMMARY
Course Elapsed Days: 28
Plan Fractions Treated to Date: 4
Plan Prescribed Dose Per Fraction: 2.5 Gy
Plan Total Fractions Prescribed: 4
Plan Total Prescribed Dose: 10 Gy
Reference Point Dosage Given to Date: 52.56 Gy
Reference Point Session Dosage Given: 2.5 Gy
Session Number: 20

## 2022-06-13 NOTE — Progress Notes (Signed)
                                                                                                                                                             Patient Name: ARWYN BESAW MRN: [id number] DOB: 07-24-1951 Referring Physician: Donnie Mesa (Profile Not Attached) Date of Service: 05/31/2022 [facility], [place]                                                        End Of Treatment Note  Diagnoses: C50.512-Malignant neoplasm of lower-outer quadrant of left female breast  Cancer Staging: Intermediate grade, ER/PR positive DCIS of the left breast.  Intent: Curative  Radiation Treatment Dates: 05/03/2022 through 05/31/2022 Site Technique Total Dose (Gy) Dose per Fx (Gy) Completed Fx Beam Energies  Breast, Left: Breast_L 3D 42.56/42.56 2.66 16/16 10XFFF  Breast, Left: Breast_L_Bst 3D 10/10 2.5 4/4 6X, 10X   Narrative: The patient tolerated radiation therapy relatively well. She developed fatigue and anticipated skin changes in the treatment field.   Plan: The patient will receive a call in about one month from the radiation oncology department. He will continue follow up with Dr. Chryl Heck as well.   ________________________________________________    Carola Rhine, [facility]

## 2022-06-14 ENCOUNTER — Encounter: Payer: Self-pay | Admitting: Hematology and Oncology

## 2022-06-14 ENCOUNTER — Other Ambulatory Visit: Payer: Self-pay

## 2022-06-14 ENCOUNTER — Inpatient Hospital Stay: Payer: Medicare PPO | Attending: Hematology and Oncology | Admitting: Hematology and Oncology

## 2022-06-14 DIAGNOSIS — Z79811 Long term (current) use of aromatase inhibitors: Secondary | ICD-10-CM | POA: Insufficient documentation

## 2022-06-14 DIAGNOSIS — Z79899 Other long term (current) drug therapy: Secondary | ICD-10-CM | POA: Diagnosis not present

## 2022-06-14 DIAGNOSIS — C50512 Malignant neoplasm of lower-outer quadrant of left female breast: Secondary | ICD-10-CM | POA: Insufficient documentation

## 2022-06-14 DIAGNOSIS — Z923 Personal history of irradiation: Secondary | ICD-10-CM | POA: Diagnosis not present

## 2022-06-14 DIAGNOSIS — Z170 Estrogen receptor positive status [ER+]: Secondary | ICD-10-CM | POA: Insufficient documentation

## 2022-06-14 MED ORDER — ANASTROZOLE 1 MG PO TABS: 1.0000 mg | ORAL_TABLET | Freq: Every day | ORAL | 3 refills | Status: DC

## 2022-06-14 NOTE — Progress Notes (Signed)
[facility] NOTE  Patient Care Team: Isaac Bliss, Rayford Halsted, MD as PCP - General (Internal Medicine) Mauro Kaufmann, RN as Oncology Nurse Navigator Rockwell Germany, RN as Oncology Nurse Navigator Benay Pike, MD as Consulting Physician (Hematology and Oncology)  CHIEF COMPLAINTS/PURPOSE OF CONSULTATION:  Newly diagnosed breast cancer  HISTORY OF PRESENTING ILLNESS:  Karina Allen 71 y.o. female is here because of recent diagnosis of left breast DCIS  I reviewed her records extensively and collaborated the history with the patient.  SUMMARY OF ONCOLOGIC HISTORY: Oncology History  Malignant neoplasm of lower-outer quadrant of left breast of female, estrogen receptor positive ([place])  02/07/2022 Mammogram   Screening mammogram from February 07, 2022 showed calcifications in the left breast.  Diagnostic mammogram confirmed a group of indeterminate microcalcifications in the lateral lower left breast measuring 1 x 0.4 x 8.8 cm.    03/05/2022 Pathology Results   Pathology from the left breast showed DCIS with predominantly cribriform growth pattern and scattered foci of intraluminal microcalcification, grade 2, ER 100% positive strong staining, PR 70% positive moderate staining and Ki-67 of 2%   03/12/2022 Initial Diagnosis   Malignant neoplasm of lower-outer quadrant of left breast of female, estrogen receptor positive ([place]).    03/20/2022 Surgery   She had left breast lumpectomy which showed focus of residual ductal carcinoma in situ with calcifications, intermediate grade, 2.5 mm.  DCIS is 0.1 cm from the inked medial margin    - 05/31/2022 Radiation Therapy   Completed adjuvant radiation.    Birth control: she used long time ago in 1980's, used it for about less than 5 yrs No use of HRT. Husband died 3 yrs ago, had lupus She has 2 kids, one daughter and one son.  Interval History  She completed adjuvant radiation.  She is healing well, except for some  skin tenderness. Rest of the pertinent 10 point ROS reviewed and negative  MEDICAL HISTORY:  Past Medical History:  Diagnosis Date   Arthritis    osteo   Breast cancer ([place]) 03/20/2022   Depression    Epiretinal membrane 02/05/2017   Sees Dr. Valetta Close and Dr. Iona Hansen   Essential hypertension 04/27/2014   FH: migraines    GAD (generalized anxiety disorder) 08/04/2015   Hepatitis B    reports dx and tx as a young child, reports no issues since and retested as adult and told did not have hepatitis, she is unsure of type of hep she had   Thyroid disease     SURGICAL HISTORY: Past Surgical History:  Procedure Laterality Date   APPENDECTOMY     BREAST LUMPECTOMY WITH RADIOACTIVE SEED LOCALIZATION Left 03/20/2022   Procedure: LEFT BREAST LUMPECTOMY WITH RADIOACTIVE SEED LOCALIZATION;  Surgeon: Donnie Mesa, MD;  Location: [facility];  Service: General;  Laterality: Left;   CATARACT EXTRACTION     CHOLECYSTECTOMY     ELBOW SURGERY     shattered elbow   endometrial oblation     FOOT SURGERY     RETINAL DETACHMENT SURGERY     REVERSE SHOULDER ARTHROPLASTY Left 09/20/2021   Procedure: LEFT REVERSE SHOULDER ARTHROPLASTY;  Surgeon: Hiram Gash, MD;  Location: [facility];  Service: Orthopedics;  Laterality: Left;   TONSILLECTOMY AND ADENOIDECTOMY     TUBAL LIGATION      SOCIAL HISTORY: Social History   Socioeconomic History   Marital status: Widowed    Spouse name: Not on file   Number of children: Not  on file   Years of education: Not on file   Highest education level: Master's degree (e.g., MA, MS, MEng, MEd, MSW, MBA)  Occupational History   Not on file  Tobacco Use   Smoking status: Never   Smokeless tobacco: Never  Substance and Sexual Activity   Alcohol use: Not Currently    Comment: stopped 5 years ago per patient   Drug use: No   Sexual activity: Not on file  Other Topics Concern   Not on file  Social History Narrative   Work or School: Psychologist, occupational at  [employer], 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, [place] Situation: Widowed      Spiritual Beliefs: Christian      Lifestyle: some walking; working on diet      Social Determinants of Health   Financial Resource Strain: Fertile  (04/16/2022)   Overall Financial Resource Strain (CARDIA)    Difficulty of Paying Living Expenses: Not hard at all  Food Insecurity: No Food Insecurity (04/16/2022)   Hunger Vital Sign    Worried About Running Out of Food in the Last Year: Never true    [place] in the Last Year: Never true  Transportation Needs: No Transportation Needs (04/16/2022)   PRAPARE - Hydrologist (Medical): No    Lack of Transportation (Non-Medical): No  Physical Activity: Insufficiently Active (04/16/2022)   Exercise Vital Sign    Days of Exercise per Week: 3 days    Minutes of Exercise per Session: 40 min  Stress: No Stress Concern Present (04/16/2022)   [place]    Feeling of Stress : Not at all  Social Connections: Moderately Integrated (04/16/2022)   Social Connection and Isolation Panel [NHANES]    Frequency of Communication with Friends and Family: More than three times a week    Frequency of Social Gatherings with Friends and Family: More than three times a week    Attends Religious Services: More than 4 times per year    Active Member of [employer] or Organizations: Yes    Attends Archivist Meetings: More than 4 times per year    Marital Status: Widowed  Human resources officer Violence: Not on file    FAMILY HISTORY: Family History  Problem Relation Age of Onset   Testicular cancer Brother     ALLERGIES:  is allergic to penicillins.  MEDICATIONS:  Current Outpatient Medications  Medication Sig Dispense Refill   anastrozole (ARIMIDEX) 1 MG tablet Take 1 tablet (1 mg total) by mouth daily. 90 tablet 3   carboxymethylcellulose (REFRESH PLUS) 0.5 % SOLN Place 1 drop  into both eyes 3 (three) times daily as needed (dry eyes).     Cholecalciferol (SM VITAMIN D3) 100 MCG (4000 UT) CAPS Take 1 capsule by mouth daily. DV3 + Immune Support     hydrochlorothiazide (HYDRODIURIL) 25 MG tablet TAKE ONE TABLET BY MOUTH DAILY 90 tablet 1   NON FORMULARY Take 1 capsule by mouth daily. Doterra  Food Nutrient Complex     olmesartan (BENICAR) 5 MG tablet TAKE TWO TABLETS BY MOUTH DAILY 180 tablet 1   SYNTHROID 175 MCG tablet TAKE ONE TABLET BY MOUTH EVERY MORNING BEFORE BREAKFAST 90 tablet 1   tamoxifen (NOLVADEX) 20 MG tablet Take 1 tablet (20 mg total) by mouth daily. 90 tablet 1   No current facility-administered medications for this visit.    REVIEW OF SYSTEMS:  Constitutional: Denies fevers, chills or abnormal night sweats Eyes: Denies blurriness of vision, double vision or watery eyes Ears, nose, mouth, throat, and face: Denies mucositis or sore throat Respiratory: Denies cough, dyspnea or wheezes Cardiovascular: Denies palpitation, chest discomfort or lower extremity swelling Gastrointestinal:  Denies nausea, heartburn or change in bowel habits Skin: Denies abnormal skin rashes Lymphatics: Denies new lymphadenopathy or easy bruising Neurological:Denies numbness, tingling or new weaknesses Behavioral/Psych: Mood is stable, no new changes  Breast: Denies any palpable lumps or discharge All other systems were reviewed with the patient and are negative.  PHYSICAL EXAMINATION: ECOG PERFORMANCE STATUS: 0 - Asymptomatic  Vitals:   06/14/22 0908  BP: 135/64  Pulse: 75  Temp: 98 F (36.7 C)  SpO2: 100%   Filed Weights   06/14/22 0908  Weight: 213 lb 11.2 oz (96.9 kg)    GENERAL:alert, no distress and comfortable Breast: Left breast erythema, post radiation changes   LABORATORY DATA:  I have reviewed the data as listed Lab Results  Component Value Date   WBC 9.7 03/16/2022   HGB 12.5 03/16/2022   HCT 36.9 03/16/2022   MCV 86.0 03/16/2022   PLT  289 03/16/2022   Lab Results  Component Value Date   NA 138 03/16/2022   K 3.5 03/16/2022   CL 105 03/16/2022   CO2 27 03/16/2022    RADIOGRAPHIC STUDIES: I have personally reviewed the radiological reports and agreed with the findings in the report.  ASSESSMENT AND PLAN:    Malignant neoplasm of lower-outer quadrant of left breast of female, estrogen receptor positive ([place]) This is a very pleasant 71 year old female patient with DCIS of left breast, ER/PR strongly positive status post left breast lumpectomy referred to medical oncology for additional recommendations.  We have discussed the following details about DCIS  Pathology review: I discussed with the patient the difference between DCIS and invasive breast cancer. It is considered a precancerous lesion. DCIS is classified as a Stage 0 breast cancer. It is generally detected through mammograms as calcifications. We discussed the significance of grades and its impact on prognosis. We also discussed the importance of ER and PR receptors and their implications to adjuvant treatment options. Prognosis of DCIS dependence on grade and degree of comedo necrosis. It is anticipated that if not treated, 20-30% of DCIS can develop into invasive breast cancer.  Recommendation: 1. Breast conserving surgery 2. Followed by adjuvant radiation therapy 3. Followed by antiestrogen therapy with tamoxifen/aromatase inhibitors based on menopausal status 5 years  She has already completed left breast lumpectomy, closest margin is 1 mm. She completed adjuvant radiation 05/31/2022. She was leaning towards aromatase inhibitors now because she was worried about blood clots and uterine cancer from tamoxifen.   Aromatase inhibitors counseling: We have discussed the mechanism of action of aromatase inhibitors today.  We have discussed adverse effects including but not limited to menopausal symptoms, increased risk of osteoporosis and fractures, cardiovascular  events, arthralgias and myalgias.  We do believe that the benefits far outweigh the risks.  Plan treatment duration of 5 years. She will be getting a new bone density scan with her orthopedic team.   She was recommended to bring a copy of the report for [place].   In the interim I have discussed about continuing weightbearing exercises as tolerated, vitamin D supplementation. All her questions were answered to the best of my knowledge.  Total time spent: 30 minutes including history, review of records, counseling and coordination of care All questions were answered.  The patient knows to call the clinic with any problems, questions or concerns.    Benay Pike, MD 06/14/22

## 2022-06-14 NOTE — Assessment & Plan Note (Addendum)
This is a very pleasant 71 year old female patient with DCIS of left breast, ER/PR strongly positive status post left breast lumpectomy referred to medical oncology for additional recommendations.  We have discussed the following details about DCIS  Pathology review: I discussed with the patient the difference between DCIS and invasive breast cancer. It is considered a precancerous lesion. DCIS is classified as a Stage 0 breast cancer. It is generally detected through mammograms as calcifications. We discussed the significance of grades and its impact on prognosis. We also discussed the importance of ER and PR receptors and their implications to adjuvant treatment options. Prognosis of DCIS dependence on grade and degree of comedo necrosis. It is anticipated that if not treated, 20-30% of DCIS can develop into invasive breast cancer.  Recommendation: 1. Breast conserving surgery 2. Followed by adjuvant radiation therapy 3. Followed by antiestrogen therapy with tamoxifen/aromatase inhibitors based on menopausal status 5 years  She has already completed left breast lumpectomy, closest margin is 1 mm. She completed adjuvant radiation 05/31/2022. She was leaning towards aromatase inhibitors now because she was worried about blood clots and uterine cancer from tamoxifen.   Aromatase inhibitors counseling: We have discussed the mechanism of action of aromatase inhibitors today.  We have discussed adverse effects including but not limited to menopausal symptoms, increased risk of osteoporosis and fractures, cardiovascular events, arthralgias and myalgias.  We do believe that the benefits far outweigh the risks.  Plan treatment duration of 5 years. She will be getting a new bone density scan with her orthopedic team.   She was recommended to bring a copy of the report for [place].   In the interim I have discussed about continuing weightbearing exercises as tolerated, vitamin D supplementation. All her questions  were answered to the best of my knowledge.

## 2022-06-21 ENCOUNTER — Ambulatory Visit: Payer: Medicare PPO | Admitting: Hematology and Oncology

## 2022-06-25 ENCOUNTER — Encounter: Payer: Self-pay | Admitting: Internal Medicine

## 2022-06-25 DIAGNOSIS — Z13820 Encounter for screening for osteoporosis: Secondary | ICD-10-CM

## 2022-06-25 NOTE — Telephone Encounter (Signed)
Okay to order?

## 2022-09-06 ENCOUNTER — Inpatient Hospital Stay: Payer: Medicare PPO | Admitting: Adult Health

## 2022-09-06 ENCOUNTER — Ambulatory Visit: Payer: Medicare PPO | Admitting: Hematology and Oncology

## 2022-09-13 ENCOUNTER — Inpatient Hospital Stay: Payer: Medicare PPO | Attending: Hematology and Oncology | Admitting: Adult Health

## 2022-09-13 ENCOUNTER — Encounter: Payer: Self-pay | Admitting: Adult Health

## 2022-09-13 VITALS — BP 119/65 | HR 80 | Temp 97.8°F | Resp 16 | Ht 64.0 in | Wt 222.0 lb

## 2022-09-13 DIAGNOSIS — C50512 Malignant neoplasm of lower-outer quadrant of left female breast: Secondary | ICD-10-CM | POA: Diagnosis not present

## 2022-09-13 DIAGNOSIS — Z79899 Other long term (current) drug therapy: Secondary | ICD-10-CM | POA: Diagnosis not present

## 2022-09-13 DIAGNOSIS — Z170 Estrogen receptor positive status [ER+]: Secondary | ICD-10-CM | POA: Diagnosis not present

## 2022-09-13 DIAGNOSIS — Z79811 Long term (current) use of aromatase inhibitors: Secondary | ICD-10-CM | POA: Insufficient documentation

## 2022-09-13 DIAGNOSIS — Z923 Personal history of irradiation: Secondary | ICD-10-CM | POA: Insufficient documentation

## 2022-09-13 DIAGNOSIS — R232 Flushing: Secondary | ICD-10-CM | POA: Insufficient documentation

## 2022-09-13 DIAGNOSIS — I10 Essential (primary) hypertension: Secondary | ICD-10-CM | POA: Insufficient documentation

## 2022-09-13 DIAGNOSIS — R5383 Other fatigue: Secondary | ICD-10-CM | POA: Diagnosis not present

## 2022-09-13 NOTE — Progress Notes (Signed)
SURVIVORSHIP VISIT:  BRIEF ONCOLOGIC HISTORY:  Oncology History  Malignant neoplasm of lower-outer quadrant of left breast of female, estrogen receptor positive ([place])  02/07/2022 Mammogram   Screening mammogram from February 07, 2022 showed calcifications in the left breast.  Diagnostic mammogram confirmed a group of indeterminate microcalcifications in the lateral lower left breast measuring 1 x 0.4 x 8.8 cm.    03/05/2022 Pathology Results   Pathology from the left breast showed DCIS with predominantly cribriform growth pattern and scattered foci of intraluminal microcalcification, grade 2, ER 100% positive strong staining, PR 70% positive moderate staining and Ki-67 of 2%   03/12/2022 Initial Diagnosis   Malignant neoplasm of lower-outer quadrant of left breast of female, estrogen receptor positive ([place]).    03/20/2022 Surgery   She had left breast lumpectomy which showed focus of residual ductal carcinoma in situ with calcifications, intermediate grade, 2.5 mm.  DCIS is 0.1 cm from the inked medial margin   03/20/2022 Cancer Staging   Staging form: Breast, AJCC 8th Edition - Pathologic stage from 03/20/2022: Stage 0 (pTis (DCIS), pN0, cM0, ER+, PR+) - Signed by Gardenia Phlegm, NP on 09/13/2022   05/03/2022 - 05/31/2022 Radiation Therapy   Site Technique Total Dose (Gy) Dose per Fx (Gy) Completed Fx Beam Energies  Breast, Left: Breast_L 3D 42.56/42.56 2.66 16/16 10XFFF  Breast, Left: Breast_L_Bst 3D 10/10 2.5 4/4 6X, 10X     06/2022 -  Anti-estrogen oral therapy   Tamoxifen x 5 years     INTERVAL HISTORY:  Karina Allen to review her survivorship care plan detailing her treatment course for breast cancer, as well as monitoring long-term side effects of that treatment, education regarding health maintenance, screening, and overall wellness and health promotion.     Overall, Karina Allen reports feeling quite well.  She is taking Anastrozole daily with good tolerance.  She does experience hot  flashes and occasional fatigue.    REVIEW OF SYSTEMS:  Review of Systems  Constitutional:  Positive for fatigue. Negative for appetite change, chills, fever and unexpected weight change.  HENT:   Negative for hearing loss, lump/mass and trouble swallowing.   Eyes:  Negative for eye problems and icterus.  Respiratory:  Negative for chest tightness, cough and shortness of breath.   Cardiovascular:  Negative for chest pain, leg swelling and palpitations.  Gastrointestinal:  Negative for abdominal distention, abdominal pain, constipation, diarrhea, nausea and vomiting.  Endocrine: Positive for hot flashes.  Genitourinary:  Negative for difficulty urinating.   Musculoskeletal:  Negative for arthralgias.  Skin:  Negative for itching and rash.  Neurological:  Negative for dizziness, extremity weakness, headaches and numbness.  Hematological:  Negative for adenopathy. Does not bruise/bleed easily.  Psychiatric/Behavioral:  Negative for depression. The patient is not nervous/anxious.    Breast: Denies any new nodularity, masses, tenderness, nipple changes, or nipple discharge.    ONCOLOGY TREATMENT TEAM:  1. Surgeon:  Dr. Georgette Dover at [facility] 2. Medical Oncologist: Dr. Chryl Heck  3. Radiation Oncologist: Dr. Lisbeth Renshaw    PAST MEDICAL/SURGICAL HISTORY:  Past Medical History:  Diagnosis Date   Arthritis    osteo   Breast cancer ([place]) 03/20/2022   Depression    Epiretinal membrane 02/05/2017   Sees Dr. Valetta Close and Dr. Iona Hansen   Essential hypertension 04/27/2014   FH: migraines    GAD (generalized anxiety disorder) 08/04/2015   Hepatitis B    reports dx and tx as a young child, reports no issues since and retested as adult and  told did not have hepatitis, she is unsure of type of hep she had   Thyroid disease    Past Surgical History:  Procedure Laterality Date   APPENDECTOMY     BREAST LUMPECTOMY WITH RADIOACTIVE SEED LOCALIZATION Left 03/20/2022   Procedure: LEFT BREAST  LUMPECTOMY WITH RADIOACTIVE SEED LOCALIZATION;  Surgeon: Donnie Mesa, MD;  Location: [facility];  Service: General;  Laterality: Left;   CATARACT EXTRACTION     CHOLECYSTECTOMY     ELBOW SURGERY     shattered elbow   endometrial oblation     FOOT SURGERY     RETINAL DETACHMENT SURGERY     REVERSE SHOULDER ARTHROPLASTY Left 09/20/2021   Procedure: LEFT REVERSE SHOULDER ARTHROPLASTY;  Surgeon: Hiram Gash, MD;  Location: [facility];  Service: Orthopedics;  Laterality: Left;   TONSILLECTOMY AND ADENOIDECTOMY     TUBAL LIGATION       ALLERGIES:  Allergies  Allergen Reactions   Penicillins Hives    Childhood Reaction  Received Ancef on 03-20-2022 without issue     CURRENT MEDICATIONS:  Outpatient Encounter Medications as of 09/13/2022  Medication Sig   anastrozole (ARIMIDEX) 1 MG tablet Take 1 tablet (1 mg total) by mouth daily.   carboxymethylcellulose (REFRESH PLUS) 0.5 % SOLN Place 1 drop into both eyes 3 (three) times daily as needed (dry eyes).   Cholecalciferol (SM VITAMIN D3) 100 MCG (4000 UT) CAPS Take 1 capsule by mouth daily. DV3 + Immune Support   hydrochlorothiazide (HYDRODIURIL) 25 MG tablet TAKE ONE TABLET BY MOUTH DAILY   NON FORMULARY Take 1 capsule by mouth daily. Doterra  Food Nutrient Complex   olmesartan (BENICAR) 5 MG tablet TAKE TWO TABLETS BY MOUTH DAILY   SYNTHROID 175 MCG tablet TAKE ONE TABLET BY MOUTH EVERY MORNING BEFORE BREAKFAST   [DISCONTINUED] tamoxifen (NOLVADEX) 20 MG tablet Take 1 tablet (20 mg total) by mouth daily.   No facility-administered encounter medications on file as of 09/13/2022.     ONCOLOGIC FAMILY HISTORY:  Family History  Problem Relation Age of Onset   Testicular cancer Brother      SOCIAL HISTORY:  Social History   Socioeconomic History   Marital status: Widowed    Spouse name: Not on file   Number of children: Not on file   Years of education: Not on file   Highest education level: Master's degree  (e.g., MA, MS, MEng, MEd, MSW, MBA)  Occupational History   Not on file  Tobacco Use   Smoking status: Never   Smokeless tobacco: Never  Substance and Sexual Activity   Alcohol use: Not Currently    Comment: stopped 5 years ago per patient   Drug use: No   Sexual activity: Not on file  Other Topics Concern   Not on file  Social History Narrative   Work or School: Psychologist, occupational at [employer], 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, [place] Situation: Widowed      Spiritual Beliefs: Christian      Lifestyle: some walking; working on diet      Social Determinants of Health   Financial Resource Strain: Lilly  (04/16/2022)   Overall Financial Resource Strain (CARDIA)    Difficulty of Paying Living Expenses: Not hard at all  Food Insecurity: No Food Insecurity (04/16/2022)   Hunger Vital Sign    Worried About Running Out of Food in the Last Year: Never true    [place] in the Last Year: Never  true  Transportation Needs: No Transportation Needs (04/16/2022)   PRAPARE - Hydrologist (Medical): No    Lack of Transportation (Non-Medical): No  Physical Activity: Insufficiently Active (04/16/2022)   Exercise Vital Sign    Days of Exercise per Week: 3 days    Minutes of Exercise per Session: 40 min  Stress: No Stress Concern Present (04/16/2022)   [place]    Feeling of Stress : Not at all  Social Connections: Moderately Integrated (04/16/2022)   Social Connection and Isolation Panel [NHANES]    Frequency of Communication with Friends and Family: More than three times a week    Frequency of Social Gatherings with Friends and Family: More than three times a week    Attends Religious Services: More than 4 times per year    Active Member of [employer] or Organizations: Yes    Attends Archivist Meetings: More than 4 times per year    Marital Status: Widowed  Human resources officer Violence: Not on  file     OBSERVATIONS/OBJECTIVE:  BP 119/65 (BP Location: Left Arm, Patient Position: Sitting)   Pulse 80   Temp 97.8 F (36.6 C) (Temporal)   Resp 16   Ht _0  (1.626 m)   Wt 222 lb (100.7 kg)   SpO2 98%   BMI 38.11 kg/m  GENERAL: Patient is a well appearing female in no acute distress HEENT:  Sclerae anicteric.  Oropharynx clear and moist. No ulcerations or evidence of oropharyngeal candidiasis. Neck is supple.  NODES:  No cervical, supraclavicular, or axillary lymphadenopathy palpated.  BREAST EXAM:  left breast s/p lumpectomy and radiation, no sign of local recurrence, right breast benign LUNGS:  Clear to auscultation bilaterally.  No wheezes or rhonchi. HEART:  Regular rate and rhythm. No murmur appreciated. ABDOMEN:  Soft, nontender.  Positive, normoactive bowel sounds. No organomegaly palpated. MSK:  No focal spinal tenderness to palpation. Full range of motion bilaterally in the upper extremities. EXTREMITIES:  No peripheral edema.   SKIN:  Clear with no obvious rashes or skin changes. No nail dyscrasia. NEURO:  Nonfocal. Well oriented.  Appropriate affect.   LABORATORY DATA:  None for this visit.  DIAGNOSTIC IMAGING:  None for this visit.      ASSESSMENT AND PLAN:  Ms.. Allen is a pleasant 71 y.o. female with Stage 0 left breast DCIS, ER+/PR+, diagnosed in 03/2022, treated with lumpectomy, adjuvant radiation therapy, and anti-estrogen therapy with Anastrozole beginning in 06/2022.  She presents to the Survivorship Clinic for our initial meeting and routine follow-up post-completion of treatment for breast cancer.    1. Stage 0 left breast cancer:  Karina Allen is continuing to recover from definitive treatment for breast cancer. She will follow-up with her medical oncologist, Dr. Chryl Heck in 3 months with history and physical exam per surveillance protocol.  She will continue her anti-estrogen therapy with Tamoxifen. Thus far, she is tolerating the Tamoxifen well, with  minimal side effects.  Her mammogram is due 01/2023; orders placed today.  Today, a comprehensive survivorship care plan and treatment summary was reviewed with the patient today detailing her breast cancer diagnosis, treatment course, potential late/long-term effects of treatment, appropriate follow-up care with recommendations for the future, and patient education resources.  A copy of this summary, along with a letter will be sent to the patient's primary care provider via mail/fax/In Basket message after today's visit.    2. Bone health: She will  undergo bone density testing in February 2024 to evaluate her bone strength.  In the meantime, she was given education on specific activities to promote bone health.  3. Cancer screening:  Due to Karina Allen's history and her age, she should receive screening for skin cancers, colon cancer.  The information and recommendations are listed on the patient's comprehensive care plan/treatment summary and were reviewed in detail with the patient.    4. Health maintenance and wellness promotion: Karina Allen was encouraged to consume 5-7 servings of fruits and vegetables per day. We reviewed the "Nutrition Rainbow" handout.  She was also encouraged to engage in moderate to vigorous exercise for 30 minutes per day most days of the week. We discussed the [employer] fitness program, which is designed for cancer survivors to help them become more physically fit after cancer treatments.  She was instructed to limit her alcohol consumption and continue to abstain from tobacco use.     5. Support services/counseling: It is not uncommon for this period of the patient's cancer care trajectory to be one of many emotions and stressors.   She was given information regarding our available services and encouraged to contact me with any questions or for help enrolling in any of our support group/programs.    Follow up instructions:    -Return to cancer center in 6 months for f/u  with Dr. Chryl Heck  -Mammogram due in 11/2022 -Bone density 12/2022 -She is welcome to return back to the Survivorship Clinic at any time; no additional follow-up needed at this time.  -Consider referral back to survivorship as a long-term survivor for continued surveillance  The patient was provided an opportunity to ask questions and all were answered. The patient agreed with the plan and demonstrated an understanding of the instructions.   Total encounter time 40 minutes*in face-to-face visit time, chart review, lab review, care coordination, order entry, and documentation of the encounter time.    Wilber Bihari, NP 09/13/22 12:44 PM Medical Oncology and Hematology [facility] [place], [place] [address] Tel. [phone]    Fax. [phone]  *Total Encounter Time as defined by the Centers for Medicare and Medicaid Services includes, in addition to the face-to-face time of a patient visit (documented in the note above) non-face-to-face time: obtaining and reviewing outside history, ordering and reviewing medications, tests or procedures, care coordination (communications with other health care professionals or caregivers) and documentation in the medical record.

## 2022-10-17 ENCOUNTER — Telehealth (INDEPENDENT_AMBULATORY_CARE_PROVIDER_SITE_OTHER): Payer: Medicare PPO | Admitting: Family Medicine

## 2022-10-17 ENCOUNTER — Encounter: Payer: Self-pay | Admitting: Family Medicine

## 2022-10-17 ENCOUNTER — Telehealth: Payer: Self-pay | Admitting: Internal Medicine

## 2022-10-17 DIAGNOSIS — U071 COVID-19: Secondary | ICD-10-CM

## 2022-10-17 NOTE — Telephone Encounter (Signed)
Pt scheduled for a VV with Dr. Sarajane Jews this afternoon, after much convincing.  Pt prefers a phone call.

## 2022-10-17 NOTE — Telephone Encounter (Signed)
Left detailed message on machine for patient.  She will need to schedule a virtual visit for a prescription.

## 2022-10-17 NOTE — Progress Notes (Signed)
Subjective:    Patient ID: Karina Allen, female    DOB: 03/03/1951, 71 y.o.   MRN: [id number]  HPI Virtual Visit via Video Note  I connected with the patient on 10/17/22 at  2:45 PM EST by a video enabled telemedicine application and verified that I am speaking with the correct person using two identifiers.  Location patient: home Location provider:work or home office Persons participating in the virtual visit: patient, provider  I discussed the limitations of evaluation and management by telemedicine and the availability of in person appointments. The patient expressed understanding and agreed to proceed.   HPI: Here for a Covid infection. About 4 days ago she began to have ST, headache, a fever to 101.4 degrees, and a dry cough. No SOB. She tested positive for Covid 3 days ago. She has been drinking fluids. Today most of the symptoms have resolved except for a ST.    ROS: See pertinent positives and negatives per HPI.  Past Medical History:  Diagnosis Date   Arthritis    osteo   Breast cancer ([place]) 03/20/2022   Depression    Epiretinal membrane 02/05/2017   Sees Dr. Valetta Close and Dr. Iona Hansen   Essential hypertension 04/27/2014   FH: migraines    GAD (generalized anxiety disorder) 08/04/2015   Hepatitis B    reports dx and tx as a young child, reports no issues since and retested as adult and told did not have hepatitis, she is unsure of type of hep she had   Thyroid disease     Past Surgical History:  Procedure Laterality Date   APPENDECTOMY     BREAST LUMPECTOMY WITH RADIOACTIVE SEED LOCALIZATION Left 03/20/2022   Procedure: LEFT BREAST LUMPECTOMY WITH RADIOACTIVE SEED LOCALIZATION;  Surgeon: Donnie Mesa, MD;  Location: [facility];  Service: General;  Laterality: Left;   CATARACT EXTRACTION     CHOLECYSTECTOMY     ELBOW SURGERY     shattered elbow   endometrial oblation     FOOT SURGERY     RETINAL DETACHMENT SURGERY     REVERSE SHOULDER ARTHROPLASTY Left 09/20/2021    Procedure: LEFT REVERSE SHOULDER ARTHROPLASTY;  Surgeon: Hiram Gash, MD;  Location: [facility];  Service: Orthopedics;  Laterality: Left;   TONSILLECTOMY AND ADENOIDECTOMY     TUBAL LIGATION      Family History  Problem Relation Age of Onset   Testicular cancer Brother      Current Outpatient Medications:    anastrozole (ARIMIDEX) 1 MG tablet, Take 1 tablet (1 mg total) by mouth daily., Disp: 90 tablet, Rfl: 3   carboxymethylcellulose (REFRESH PLUS) 0.5 % SOLN, Place 1 drop into both eyes 3 (three) times daily as needed (dry eyes)., Disp: , Rfl:    Cholecalciferol (SM VITAMIN D3) 100 MCG (4000 UT) CAPS, Take 1 capsule by mouth daily. DV3 + Immune Support, Disp: , Rfl:    hydrochlorothiazide (HYDRODIURIL) 25 MG tablet, TAKE ONE TABLET BY MOUTH DAILY, Disp: 90 tablet, Rfl: 1   NON FORMULARY, Take 1 capsule by mouth daily. Doterra  Food Nutrient Complex, Disp: , Rfl:    olmesartan (BENICAR) 5 MG tablet, TAKE TWO TABLETS BY MOUTH DAILY, Disp: 180 tablet, Rfl: 1   SYNTHROID 175 MCG tablet, TAKE ONE TABLET BY MOUTH EVERY MORNING BEFORE BREAKFAST, Disp: 90 tablet, Rfl: 1  EXAM:  VITALS per patient if applicable:  GENERAL: alert, oriented, appears well and in no acute distress  HEENT: atraumatic, conjunttiva clear, no obvious abnormalities  on inspection of external nose and ears  NECK: normal movements of the head and neck  LUNGS: on inspection no signs of respiratory distress, breathing rate appears normal, no obvious gross SOB, gasping or wheezing  CV: no obvious cyanosis  MS: moves all visible extremities without noticeable abnormality  PSYCH/NEURO: pleasant and cooperative, no obvious depression or anxiety, speech and thought processing grossly intact  ASSESSMENT AND PLAN: Covid infection. She seems to be getting over this. I suggested taking Ibuprofen and using throat lozenges as needed. [name], MD  Discussed the following assessment and plan:  No  diagnosis found.     I discussed the assessment and treatment plan with the patient. The patient was provided an opportunity to ask questions and all were answered. The patient agreed with the plan and demonstrated an understanding of the instructions.   The patient was advised to call back or seek an in-person evaluation if the symptoms worsen or if the condition fails to improve as anticipated.      Review of Systems     Objective:   Physical Exam        Assessment & Plan:

## 2022-10-17 NOTE — Telephone Encounter (Signed)
Pt just tested positive for Covid and would like to know if MD could please send something to treat sore throat and headaches?  LOV:  04/19/22  Pt was offered an OV and said MD knows she only comes in every 6 mos.    Pt was offered a VV and said she has been dealing with a bad headache and will have a VV, only if MD needs her to.  Please advise.  [facility] [id number] - [place], [address] Phone: [phone]  Fax: [phone]

## 2022-10-22 ENCOUNTER — Encounter: Payer: Medicare PPO | Admitting: Internal Medicine

## 2022-11-03 ENCOUNTER — Other Ambulatory Visit: Payer: Self-pay | Admitting: Internal Medicine

## 2022-11-03 DIAGNOSIS — E038 Other specified hypothyroidism: Secondary | ICD-10-CM

## 2022-11-03 DIAGNOSIS — I10 Essential (primary) hypertension: Secondary | ICD-10-CM

## 2022-12-13 ENCOUNTER — Ambulatory Visit
Admission: RE | Admit: 2022-12-13 | Discharge: 2022-12-13 | Disposition: A | Discharge status: Home / Self Care | Payer: Medicare PPO | Source: Ambulatory Visit | Attending: Internal Medicine | Admitting: Internal Medicine

## 2022-12-13 DIAGNOSIS — Z780 Asymptomatic menopausal state: Secondary | ICD-10-CM

## 2022-12-13 DIAGNOSIS — M85851 Other specified disorders of bone density and structure, right thigh: Secondary | ICD-10-CM | POA: Diagnosis not present

## 2022-12-24 ENCOUNTER — Encounter: Payer: Self-pay | Admitting: Internal Medicine

## 2022-12-25 ENCOUNTER — Other Ambulatory Visit: Payer: Self-pay | Admitting: Internal Medicine

## 2022-12-25 ENCOUNTER — Ambulatory Visit (INDEPENDENT_AMBULATORY_CARE_PROVIDER_SITE_OTHER): Payer: Medicare PPO | Admitting: Internal Medicine

## 2022-12-25 ENCOUNTER — Encounter: Payer: Self-pay | Admitting: Internal Medicine

## 2022-12-25 VITALS — BP 110/80 | HR 76 | Temp 97.9°F | Ht 64.0 in | Wt 215.3 lb

## 2022-12-25 DIAGNOSIS — E038 Other specified hypothyroidism: Secondary | ICD-10-CM | POA: Diagnosis not present

## 2022-12-25 DIAGNOSIS — R7302 Impaired glucose tolerance (oral): Secondary | ICD-10-CM

## 2022-12-25 DIAGNOSIS — I10 Essential (primary) hypertension: Secondary | ICD-10-CM

## 2022-12-25 DIAGNOSIS — Z0000 Encounter for general adult medical examination without abnormal findings: Secondary | ICD-10-CM

## 2022-12-25 DIAGNOSIS — E876 Hypokalemia: Secondary | ICD-10-CM

## 2022-12-25 DIAGNOSIS — E7800 Pure hypercholesterolemia, unspecified: Secondary | ICD-10-CM | POA: Diagnosis not present

## 2022-12-25 DIAGNOSIS — Z1211 Encounter for screening for malignant neoplasm of colon: Secondary | ICD-10-CM

## 2022-12-25 LAB — LIPID PANEL
Cholesterol: 150 mg/dL (ref 0–200)
HDL: 42.6 mg/dL (ref >39.00)
LDL Cholesterol: 78 mg/dL (ref 0–99)
NonHDL: 107.31
Total CHOL/HDL Ratio: 4
Triglycerides: 146 mg/dL (ref 0.0–149.0)
VLDL: 29.2 mg/dL (ref 0.0–40.0)

## 2022-12-25 LAB — CBC WITH DIFFERENTIAL/PLATELET
Basophils Absolute: 0.1 10*3/uL (ref 0.0–0.1)
Basophils Relative: 1 % (ref 0.0–3.0)
Eosinophils Absolute: 0.2 10*3/uL (ref 0.0–0.7)
Eosinophils Relative: 2.3 % (ref 0.0–5.0)
HCT: 38.5 % (ref 36.0–46.0)
Hemoglobin: 13.2 g/dL (ref 12.0–15.0)
Lymphocytes Relative: 22.3 % (ref 12.0–46.0)
Lymphs Abs: 1.6 10*3/uL (ref 0.7–4.0)
MCHC: 34.3 g/dL (ref 30.0–36.0)
MCV: 83.5 fl (ref 78.0–100.0)
Monocytes Absolute: 0.5 10*3/uL (ref 0.1–1.0)
Monocytes Relative: 6.3 % (ref 3.0–12.0)
Neutro Abs: 4.9 10*3/uL (ref 1.4–7.7)
Neutrophils Relative %: 68.1 % (ref 43.0–77.0)
Platelets: 283 10*3/uL (ref 150.0–400.0)
RBC: 4.61 Mil/uL (ref 3.87–5.11)
RDW: 13.8 % (ref 11.5–15.5)
WBC: 7.2 10*3/uL (ref 4.0–10.5)

## 2022-12-25 LAB — COMPREHENSIVE METABOLIC PANEL
ALT: 17 U/L (ref 0–35)
AST: 18 U/L (ref 0–37)
Albumin: 4 g/dL (ref 3.5–5.2)
Alkaline Phosphatase: 83 U/L (ref 39–117)
BUN: 22 mg/dL (ref 6–23)
CO2: 29 mEq/L (ref 19–32)
Calcium: 9.8 mg/dL (ref 8.4–10.5)
Chloride: 101 mEq/L (ref 96–112)
Creatinine, Ser: 0.73 mg/dL (ref 0.40–1.20)
GFR: 83.14 mL/min (ref >60.00)
Glucose, Bld: 106 mg/dL — ABNORMAL HIGH (ref 70–99)
Potassium: 3.3 mEq/L — ABNORMAL LOW (ref 3.5–5.1)
Sodium: 139 mEq/L (ref 135–145)
Total Bilirubin: 0.8 mg/dL (ref 0.2–1.2)
Total Protein: 6.9 g/dL (ref 6.0–8.3)

## 2022-12-25 LAB — TSH: TSH: 1.04 u[IU]/mL (ref 0.35–5.50)

## 2022-12-25 LAB — VITAMIN B12: Vitamin B-12: 308 pg/mL (ref 211–911)

## 2022-12-25 LAB — HEMOGLOBIN A1C: Hgb A1c MFr Bld: 5.8 % (ref 4.6–6.5)

## 2022-12-25 MED ORDER — POTASSIUM CHLORIDE CRYS ER 20 MEQ PO TBCR: 20.0000 meq | EXTENDED_RELEASE_TABLET | Freq: Two times a day (BID) | ORAL | 0 refills | Status: DC

## 2022-12-25 NOTE — Progress Notes (Signed)
Established Patient Office Visit     CC/Reason for Visit: Annual preventive exam and subsequent Medicare wellness visit  HPI: Karina Allen is a 72 y.o. female who is coming in today for the above mentioned reasons. Past Medical History is significant for: Hypertension, hyperlipidemia, impaired glucose tolerance, morbid obesity, left DCIS.  She is doing well today and has no acute concerns or complaints.  She has routine eye and dental care, no perceived hearing difficulty, she has been line dancing twice a week.  She is overdue for all age-appropriate vaccinations.  She is overdue for colon cancer screening.   Past Medical/Surgical History: Past Medical History:  Diagnosis Date   Arthritis    osteo   Breast cancer ([place]) 03/20/2022   Depression    Epiretinal membrane 02/05/2017   Sees Dr. Valetta Close and Dr. Iona Hansen   Essential hypertension 04/27/2014   FH: migraines    GAD (generalized anxiety disorder) 08/04/2015   Hepatitis B    reports dx and tx as a young child, reports no issues since and retested as adult and told did not have hepatitis, she is unsure of type of hep she had   Thyroid disease     Past Surgical History:  Procedure Laterality Date   APPENDECTOMY     BREAST LUMPECTOMY WITH RADIOACTIVE SEED LOCALIZATION Left 03/20/2022   Procedure: LEFT BREAST LUMPECTOMY WITH RADIOACTIVE SEED LOCALIZATION;  Surgeon: Donnie Mesa, MD;  Location: [facility];  Service: General;  Laterality: Left;   CATARACT EXTRACTION     CHOLECYSTECTOMY     ELBOW SURGERY     shattered elbow   endometrial oblation     FOOT SURGERY     RETINAL DETACHMENT SURGERY     REVERSE SHOULDER ARTHROPLASTY Left 09/20/2021   Procedure: LEFT REVERSE SHOULDER ARTHROPLASTY;  Surgeon: Hiram Gash, MD;  Location: [facility];  Service: Orthopedics;  Laterality: Left;   TONSILLECTOMY AND ADENOIDECTOMY     TUBAL LIGATION      Social History:  reports that she has never smoked. She has never used  smokeless tobacco. She reports that she does not currently use alcohol. She reports that she does not use drugs.  Allergies: Allergies  Allergen Reactions   Penicillins Hives    Childhood Reaction  Received Ancef on 03-20-2022 without issue    Family History:  Family History  Problem Relation Age of Onset   Testicular cancer Brother      Current Outpatient Medications:    anastrozole (ARIMIDEX) 1 MG tablet, Take 1 tablet (1 mg total) by mouth daily., Disp: 90 tablet, Rfl: 3   carboxymethylcellulose (REFRESH PLUS) 0.5 % SOLN, Place 1 drop into both eyes 3 (three) times daily as needed (dry eyes)., Disp: , Rfl:    Cholecalciferol (SM VITAMIN D3) 100 MCG (4000 UT) CAPS, Take 1 capsule by mouth daily. DV3 + Immune Support, Disp: , Rfl:    hydrochlorothiazide (HYDRODIURIL) 25 MG tablet, TAKE 1 TABLET BY MOUTH DAILY, Disp: 90 tablet, Rfl: 0   levothyroxine (SYNTHROID) 175 MCG tablet, TAKE ONE TABLET BY MOUTH EVERY MORNING BEFORE BREAKFAST, Disp: 90 tablet, Rfl: 0   NON FORMULARY, Take 1 capsule by mouth daily. Doterra  Food Nutrient Complex, Disp: , Rfl:    olmesartan (BENICAR) 5 MG tablet, TAKE TWO TABLETS BY MOUTH DAILY, Disp: 180 tablet, Rfl: 0  Review of Systems:  Negative unless indicated in HPI.   Physical Exam: Vitals:   12/25/22 0706  BP: 110/80  Pulse:  76  Temp: 97.9 F (36.6 C)  TempSrc: Oral  SpO2: 100%  Weight: 215 lb 4.8 oz (97.7 kg)  Height: 5' 4"$  (1.626 m)    Body mass index is 36.96 kg/m.   Physical Exam Vitals reviewed.  Constitutional:      General: She is not in acute distress.    Appearance: Normal appearance. She is not ill-appearing, toxic-appearing or diaphoretic.  HENT:     Head: Normocephalic.     Right Ear: Tympanic membrane, ear canal and external ear normal. There is no impacted cerumen.     Left Ear: Tympanic membrane, ear canal and external ear normal. There is no impacted cerumen.     Nose: Nose normal.     Mouth/Throat:     Mouth:  Mucous membranes are moist.     Pharynx: Oropharynx is clear. No oropharyngeal exudate or posterior oropharyngeal erythema.  Eyes:     General: No scleral icterus.       Right eye: No discharge.        Left eye: No discharge.     Conjunctiva/sclera: Conjunctivae normal.     Pupils: Pupils are unequal.     Left eye: Pupil is not reactive.     Comments: Left chronically dilated pupil from injury during childhood.  Neck:     Vascular: No carotid bruit.  Cardiovascular:     Rate and Rhythm: Normal rate and regular rhythm.     Pulses: Normal pulses.     Heart sounds: Normal heart sounds.  Pulmonary:     Effort: Pulmonary effort is normal. No respiratory distress.     Breath sounds: Normal breath sounds.  Abdominal:     General: Abdomen is flat. Bowel sounds are normal.     Palpations: Abdomen is soft.  Musculoskeletal:        General: Normal range of motion.     Cervical back: Normal range of motion.  Skin:    General: Skin is warm and dry.     Capillary Refill: Capillary refill takes less than 2 seconds.  Neurological:     General: No focal deficit present.     Mental Status: She is alert and oriented to person, place, and time. Mental status is at baseline.  Psychiatric:        Mood and Affect: Mood normal.        Behavior: Behavior normal.        Thought Content: Thought content normal.        Judgment: Judgment normal.      Subsequent Medicare wellness visit   1. Risk factors, based on past  M,S,F - Cardiac Risk Factors include: advanced age (>89mn, >>70women)   2.  Physical activities: Dietary issues and exercise activities discussed:  Current Exercise Habits: Structured exercise class, Type of exercise: walking;Other - see comments, Time (Minutes): 60, Frequency (Times/Week): 4, Weekly Exercise (Minutes/Week): 240, Intensity: Moderate, Exercise limited by: orthopedic condition(s)   3.  Depression/mood:  [place] [facility] [place] [facility]        4.  ADL's:    12/25/2022    6:51 AM 12/24/2022    4:54 PM  In your present state of health, do you have any difficulty performing the following activities:  Hearing? 0 0  Vision? 0 0  Difficulty concentrating or making decisions? 0 0  Walking or climbing stairs? 0 1  Dressing or bathing? 0 0  Doing errands, shopping? 0 0  Preparing Food and eating ? N N  Using the Toilet? N N  In the past six months, have you accidently leaked urine? Y Y  Do you have problems with loss of bowel control? N N  Managing your Medications? N N  Managing your Finances? N N  Housekeeping or managing your Housekeeping? N N     5.  Fall risk:     04/16/2022    3:13 PM 04/19/2022    8:40 AM 04/19/2022    8:41 AM 12/25/2022    6:51 AM 12/25/2022    7:15 AM  Fall Risk  Falls in the past year? 0 0 0 0 0  Was there an injury with Fall?  0 0  0  Fall Risk Category Calculator  0 0  0  Fall Risk Category (Retired)  Low Low    (RETIRED) Patient Fall Risk Level  Low fall risk Low fall risk    Patient at Risk for Falls Due to  No Fall Risks No Fall Risks  No Fall Risks  Fall risk Follow up  Falls evaluation completed Falls evaluation completed  Falls evaluation completed     6.  Home safety: No problems identified   7.  Height weight, and visual acuity: height and weight as above, vision/hearing: Vision Screening   Right eye Left eye Both eyes  Without correction     With correction 20/20 20/20 20/20 $     8.  Counseling: Counseling given: Not Answered    9. Lab orders based on risk factors: Laboratory update will be reviewed   10. Cognitive assessment:        12/24/2022    4:58 PM  6CIT Screen  What Year? 0 points  What month? 0 points  What time? 0 points  Count back from 20 0 points  Months in reverse 0 points  Repeat phrase 0 points  Total Score 0 points     11. Screening: Patient provided with a written and personalized 5-10 year  screening schedule in the AVS. Health Maintenance  Topic Date Due   DTaP/Tdap/Td vaccine (2 - Td or Tdap) 11/05/2022   COVID-19 Vaccine (1) 01/10/2023*   Zoster (Shingles) Vaccine (1 of 2) 03/25/2023*   Colon Cancer Screening  12/26/2023*   Flu Shot  08/03/2025*   Mammogram  02/28/2023   Medicare Annual Wellness Visit  12/26/2023   DEXA scan (bone density measurement)  12/13/2024   Pneumonia Vaccine  Completed   Hepatitis C Screening: USPSTF Recommendation to screen - Ages 18-79 yo.  Completed   HPV Vaccine  Aged Out  *Topic was postponed. The date shown is not the original due date.    12. Provider List Update: Patient Care Team    Relationship Specialty Notifications Start End  Isaac Bliss, Rayford Halsted, MD PCP - General Internal Medicine  03/09/19   Benay Pike, MD Consulting Physician Hematology and Oncology  03/27/22   Kyung Rudd, MD Consulting Physician Radiation Oncology  09/11/22   Donnie Mesa, MD Consulting Physician General Surgery  09/11/22      13. Advance Directives: Does Patient Have a Medical Advance Directive?: Yes Type of Advance Directive: Healthcare Power of Attorney, Living will, Out of facility DNR (pink MOST or yellow form) Does patient want to make changes to medical advance directive?: No - Patient declined Copy of [place] in Chart?: No - copy requested  14. Opioids: Patient is not on any opioid prescriptions and  has no risk factors for a substance use disorder.   15.   Goals      Weight (lb) < 200 lb (90.7 kg)         I have personally reviewed and noted the following in the patient's chart:   Medical and social history Use of alcohol, tobacco or illicit drugs  Current medications and supplements Functional ability and status Nutritional status Physical activity Advanced directives List of other physicians Hospitalizations, surgeries, and ER visits in previous 12 months Vitals Screenings to include cognitive,  depression, and falls Referrals and appointments  In addition, I have reviewed and discussed with patient certain preventive protocols, quality metrics, and best practice recommendations. A written personalized care plan for preventive services as well as general preventive health recommendations were provided to patient.  Impression and Plan:  IGT (impaired glucose tolerance) - Plan: Hemoglobin A1c  Other specified hypothyroidism - Plan: TSH, Vitamin B12, VITAMIN D 25 Hydroxy (Vit-D Deficiency, Fractures)  Essential hypertension - Plan: Comprehensive metabolic panel, CBC with Differential/Platelet  Pure hypercholesterolemia - Plan: Lipid panel  Encounter for preventive health examination  Screening for malignant neoplasm of colon - Plan: Cologuard  -Recommend routine eye and dental care. -Healthy lifestyle discussed in detail. -Labs to be updated today. -Prostate cancer screening: Not applicable Health Maintenance  Topic Date Due   DTaP/Tdap/Td vaccine (2 - Td or Tdap) 11/05/2022   COVID-19 Vaccine (1) 01/10/2023*   Zoster (Shingles) Vaccine (1 of 2) 03/25/2023*   Colon Cancer Screening  12/26/2023*   Flu Shot  08/03/2025*   Mammogram  02/28/2023   Medicare Annual Wellness Visit  12/26/2023   DEXA scan (bone density measurement)  12/13/2024   Pneumonia Vaccine  Completed   Hepatitis C Screening: USPSTF Recommendation to screen - Ages 1-79 yo.  Completed   HPV Vaccine  Aged Out  *Topic was postponed. The date shown is not the original due date.   -Cologuard requested today. -Declines Pap smear due to age. -Overdue for COVID, flu, shingles, RSV, Tdap.  She declines all today, she will consider updating Tdap at pharmacy.       Lelon Frohlich, MD [facility]

## 2022-12-27 ENCOUNTER — Other Ambulatory Visit: Payer: Self-pay | Admitting: Internal Medicine

## 2022-12-27 DIAGNOSIS — E876 Hypokalemia: Secondary | ICD-10-CM

## 2022-12-31 DIAGNOSIS — Z1211 Encounter for screening for malignant neoplasm of colon: Secondary | ICD-10-CM | POA: Diagnosis not present

## 2023-01-03 DIAGNOSIS — M25511 Pain in right shoulder: Secondary | ICD-10-CM | POA: Diagnosis not present

## 2023-01-04 ENCOUNTER — Other Ambulatory Visit: Payer: Self-pay | Admitting: Orthopaedic Surgery

## 2023-01-04 DIAGNOSIS — Z01818 Encounter for other preprocedural examination: Secondary | ICD-10-CM

## 2023-01-04 NOTE — Progress Notes (Signed)
Received fax from [facility] requesting MD signature for surgical clearance. Faxed signed form and most recent office note to [phone] and [phone] with receipt confirmations. Called Pt and relayed Dr. Rob Hickman instruction to continue with anastrozole and only hold meds if she needs to be NPO for surgery. Pt verbalized understanding.

## 2023-01-09 LAB — COLOGUARD
COLOGUARD: NEGATIVE
Cologuard: NEGATIVE

## 2023-01-10 ENCOUNTER — Encounter: Payer: Self-pay | Admitting: Internal Medicine

## 2023-01-10 ENCOUNTER — Other Ambulatory Visit: Payer: Self-pay | Admitting: Internal Medicine

## 2023-01-10 DIAGNOSIS — E876 Hypokalemia: Secondary | ICD-10-CM

## 2023-01-14 ENCOUNTER — Other Ambulatory Visit (INDEPENDENT_AMBULATORY_CARE_PROVIDER_SITE_OTHER): Payer: Medicare PPO

## 2023-01-14 DIAGNOSIS — E876 Hypokalemia: Secondary | ICD-10-CM

## 2023-01-14 LAB — POTASSIUM: Potassium: 3.8 mEq/L (ref 3.5–5.1)

## 2023-01-30 ENCOUNTER — Ambulatory Visit
Admission: RE | Admit: 2023-01-30 | Discharge: 2023-01-30 | Disposition: A | Discharge status: Home / Self Care | Payer: Medicare PPO | Source: Ambulatory Visit | Attending: Orthopaedic Surgery | Admitting: Orthopaedic Surgery

## 2023-01-30 DIAGNOSIS — M19011 Primary osteoarthritis, right shoulder: Secondary | ICD-10-CM | POA: Diagnosis not present

## 2023-01-30 DIAGNOSIS — Z01818 Encounter for other preprocedural examination: Secondary | ICD-10-CM

## 2023-01-31 ENCOUNTER — Other Ambulatory Visit: Payer: Self-pay | Admitting: Internal Medicine

## 2023-01-31 DIAGNOSIS — E038 Other specified hypothyroidism: Secondary | ICD-10-CM

## 2023-01-31 DIAGNOSIS — I10 Essential (primary) hypertension: Secondary | ICD-10-CM

## 2023-02-06 ENCOUNTER — Telehealth: Payer: Self-pay | Admitting: Internal Medicine

## 2023-02-06 NOTE — Telephone Encounter (Signed)
Patient calling to check on surgical clearance that she says was sent a month ago

## 2023-02-06 NOTE — Telephone Encounter (Signed)
surgical clearance form refaxed, confirmed, and patient is aware.

## 2023-02-08 DIAGNOSIS — Z961 Presence of intraocular lens: Secondary | ICD-10-CM | POA: Diagnosis not present

## 2023-02-08 DIAGNOSIS — H524 Presbyopia: Secondary | ICD-10-CM | POA: Diagnosis not present

## 2023-02-20 DIAGNOSIS — M19011 Primary osteoarthritis, right shoulder: Secondary | ICD-10-CM | POA: Diagnosis not present

## 2023-02-20 DIAGNOSIS — G8918 Other acute postprocedural pain: Secondary | ICD-10-CM | POA: Diagnosis not present

## 2023-02-25 DIAGNOSIS — M25611 Stiffness of right shoulder, not elsewhere classified: Secondary | ICD-10-CM | POA: Diagnosis not present

## 2023-02-25 DIAGNOSIS — M6281 Muscle weakness (generalized): Secondary | ICD-10-CM | POA: Diagnosis not present

## 2023-02-25 DIAGNOSIS — Z96611 Presence of right artificial shoulder joint: Secondary | ICD-10-CM | POA: Diagnosis not present

## 2023-02-25 DIAGNOSIS — M19011 Primary osteoarthritis, right shoulder: Secondary | ICD-10-CM | POA: Diagnosis not present

## 2023-02-26 ENCOUNTER — Telehealth: Payer: Self-pay | Admitting: Hematology and Oncology

## 2023-03-01 DIAGNOSIS — M19011 Primary osteoarthritis, right shoulder: Secondary | ICD-10-CM | POA: Diagnosis not present

## 2023-03-01 DIAGNOSIS — M6281 Muscle weakness (generalized): Secondary | ICD-10-CM | POA: Diagnosis not present

## 2023-03-01 DIAGNOSIS — Z96611 Presence of right artificial shoulder joint: Secondary | ICD-10-CM | POA: Diagnosis not present

## 2023-03-01 DIAGNOSIS — M25611 Stiffness of right shoulder, not elsewhere classified: Secondary | ICD-10-CM | POA: Diagnosis not present

## 2023-03-04 DIAGNOSIS — Z96611 Presence of right artificial shoulder joint: Secondary | ICD-10-CM | POA: Diagnosis not present

## 2023-03-04 DIAGNOSIS — M25611 Stiffness of right shoulder, not elsewhere classified: Secondary | ICD-10-CM | POA: Diagnosis not present

## 2023-03-04 DIAGNOSIS — M6281 Muscle weakness (generalized): Secondary | ICD-10-CM | POA: Diagnosis not present

## 2023-03-04 DIAGNOSIS — M19011 Primary osteoarthritis, right shoulder: Secondary | ICD-10-CM | POA: Diagnosis not present

## 2023-03-06 DIAGNOSIS — M25611 Stiffness of right shoulder, not elsewhere classified: Secondary | ICD-10-CM | POA: Diagnosis not present

## 2023-03-06 DIAGNOSIS — M19011 Primary osteoarthritis, right shoulder: Secondary | ICD-10-CM | POA: Diagnosis not present

## 2023-03-06 DIAGNOSIS — M6281 Muscle weakness (generalized): Secondary | ICD-10-CM | POA: Diagnosis not present

## 2023-03-06 DIAGNOSIS — Z96611 Presence of right artificial shoulder joint: Secondary | ICD-10-CM | POA: Diagnosis not present

## 2023-03-13 DIAGNOSIS — M25611 Stiffness of right shoulder, not elsewhere classified: Secondary | ICD-10-CM | POA: Diagnosis not present

## 2023-03-13 DIAGNOSIS — Z96611 Presence of right artificial shoulder joint: Secondary | ICD-10-CM | POA: Diagnosis not present

## 2023-03-13 DIAGNOSIS — M6281 Muscle weakness (generalized): Secondary | ICD-10-CM | POA: Diagnosis not present

## 2023-03-13 DIAGNOSIS — M19011 Primary osteoarthritis, right shoulder: Secondary | ICD-10-CM | POA: Diagnosis not present

## 2023-03-14 ENCOUNTER — Inpatient Hospital Stay: Payer: Medicare PPO | Admitting: Hematology and Oncology

## 2023-03-15 DIAGNOSIS — M6281 Muscle weakness (generalized): Secondary | ICD-10-CM | POA: Diagnosis not present

## 2023-03-15 DIAGNOSIS — M19011 Primary osteoarthritis, right shoulder: Secondary | ICD-10-CM | POA: Diagnosis not present

## 2023-03-15 DIAGNOSIS — Z96611 Presence of right artificial shoulder joint: Secondary | ICD-10-CM | POA: Diagnosis not present

## 2023-03-15 DIAGNOSIS — M25611 Stiffness of right shoulder, not elsewhere classified: Secondary | ICD-10-CM | POA: Diagnosis not present

## 2023-03-19 DIAGNOSIS — M19011 Primary osteoarthritis, right shoulder: Secondary | ICD-10-CM | POA: Diagnosis not present

## 2023-03-19 DIAGNOSIS — Z96611 Presence of right artificial shoulder joint: Secondary | ICD-10-CM | POA: Diagnosis not present

## 2023-03-19 DIAGNOSIS — M6281 Muscle weakness (generalized): Secondary | ICD-10-CM | POA: Diagnosis not present

## 2023-03-19 DIAGNOSIS — M25611 Stiffness of right shoulder, not elsewhere classified: Secondary | ICD-10-CM | POA: Diagnosis not present

## 2023-03-22 DIAGNOSIS — Z96611 Presence of right artificial shoulder joint: Secondary | ICD-10-CM | POA: Diagnosis not present

## 2023-03-22 DIAGNOSIS — M25611 Stiffness of right shoulder, not elsewhere classified: Secondary | ICD-10-CM | POA: Diagnosis not present

## 2023-03-22 DIAGNOSIS — M6281 Muscle weakness (generalized): Secondary | ICD-10-CM | POA: Diagnosis not present

## 2023-03-22 DIAGNOSIS — M19011 Primary osteoarthritis, right shoulder: Secondary | ICD-10-CM | POA: Diagnosis not present

## 2023-03-26 DIAGNOSIS — M6281 Muscle weakness (generalized): Secondary | ICD-10-CM | POA: Diagnosis not present

## 2023-03-26 DIAGNOSIS — M19011 Primary osteoarthritis, right shoulder: Secondary | ICD-10-CM | POA: Diagnosis not present

## 2023-03-26 DIAGNOSIS — M25611 Stiffness of right shoulder, not elsewhere classified: Secondary | ICD-10-CM | POA: Diagnosis not present

## 2023-03-26 DIAGNOSIS — Z96611 Presence of right artificial shoulder joint: Secondary | ICD-10-CM | POA: Diagnosis not present

## 2023-03-29 DIAGNOSIS — M6281 Muscle weakness (generalized): Secondary | ICD-10-CM | POA: Diagnosis not present

## 2023-03-29 DIAGNOSIS — Z96611 Presence of right artificial shoulder joint: Secondary | ICD-10-CM | POA: Diagnosis not present

## 2023-03-29 DIAGNOSIS — M19011 Primary osteoarthritis, right shoulder: Secondary | ICD-10-CM | POA: Diagnosis not present

## 2023-03-29 DIAGNOSIS — M25611 Stiffness of right shoulder, not elsewhere classified: Secondary | ICD-10-CM | POA: Diagnosis not present

## 2023-04-03 DIAGNOSIS — M6281 Muscle weakness (generalized): Secondary | ICD-10-CM | POA: Diagnosis not present

## 2023-04-03 DIAGNOSIS — Z96611 Presence of right artificial shoulder joint: Secondary | ICD-10-CM | POA: Diagnosis not present

## 2023-04-03 DIAGNOSIS — M25611 Stiffness of right shoulder, not elsewhere classified: Secondary | ICD-10-CM | POA: Diagnosis not present

## 2023-04-03 DIAGNOSIS — M19011 Primary osteoarthritis, right shoulder: Secondary | ICD-10-CM | POA: Diagnosis not present

## 2023-04-04 ENCOUNTER — Inpatient Hospital Stay: Payer: Medicare PPO | Attending: Hematology and Oncology | Admitting: Hematology and Oncology

## 2023-04-04 VITALS — BP 126/56 | HR 74 | Temp 97.2°F | Resp 18 | Ht 64.0 in | Wt 217.1 lb

## 2023-04-04 DIAGNOSIS — Z79899 Other long term (current) drug therapy: Secondary | ICD-10-CM | POA: Insufficient documentation

## 2023-04-04 DIAGNOSIS — D0512 Intraductal carcinoma in situ of left breast: Secondary | ICD-10-CM | POA: Insufficient documentation

## 2023-04-04 DIAGNOSIS — Z79811 Long term (current) use of aromatase inhibitors: Secondary | ICD-10-CM | POA: Diagnosis not present

## 2023-04-04 DIAGNOSIS — Z170 Estrogen receptor positive status [ER+]: Secondary | ICD-10-CM | POA: Insufficient documentation

## 2023-04-04 DIAGNOSIS — C50512 Malignant neoplasm of lower-outer quadrant of left female breast: Secondary | ICD-10-CM

## 2023-04-04 DIAGNOSIS — M8580 Other specified disorders of bone density and structure, unspecified site: Secondary | ICD-10-CM | POA: Insufficient documentation

## 2023-04-04 NOTE — Progress Notes (Signed)
BRIEF ONCOLOGIC HISTORY:  Oncology History  Malignant neoplasm of lower-outer quadrant of left breast of female, estrogen receptor positive (HCC)  02/07/2022 Mammogram   Screening mammogram from February 07, 2022 showed calcifications in the left breast.  Diagnostic mammogram confirmed a group of indeterminate microcalcifications in the lateral lower left breast measuring 1 x 0.4 x 8.8 cm.    03/05/2022 Pathology Results   Pathology from the left breast showed DCIS with predominantly cribriform growth pattern and scattered foci of intraluminal microcalcification, grade 2, ER 100% positive strong staining, PR 70% positive moderate staining and Ki-67 of 2%   03/12/2022 Initial Diagnosis   Malignant neoplasm of lower-outer quadrant of left breast of female, estrogen receptor positive (HCC).    03/20/2022 Surgery   She had left breast lumpectomy which showed focus of residual ductal carcinoma in situ with calcifications, intermediate grade, 2.5 mm.  DCIS is 0.1 cm from the inked medial margin   03/20/2022 Cancer Staging   Staging form: Breast, AJCC 8th Edition - Pathologic stage from 03/20/2022: Stage 0 (pTis (DCIS), pN0, cM0, ER+, PR+) - Signed by Loa Socks, NP on 09/13/2022   05/03/2022 - 05/31/2022 Radiation Therapy   Site Technique Total Dose (Gy) Dose per Fx (Gy) Completed Fx Beam Energies  Breast, Left: Breast_L 3D 42.56/42.56 2.66 16/16 10XFFF  Breast, Left: Breast_L_Bst 3D 10/10 2.5 4/4 6X, 10X    06/2022 -  Anti-estrogen oral therapy   Anastrozole daily     INTERVAL HISTORY:   Overall, Karina Allen reports feeling quite well.  She is taking Anastrozole daily with good tolerance.  Since her last visit here, she had right shoulder replacement and is working with physical therapy.  She is doing quite well.  She had to reschedule her mammogram because of limited range of motion in the right shoulder.  She is otherwise trying to get back to walking on a daily basis.  She is taking  calcium and vitamin D supplementation.  She is not interested in pursuing bisphosphonates.  Rest of the pertinent 10 point ROS reviewed and negative   REVIEW OF SYSTEMS:  Review of Systems  Constitutional:  Positive for fatigue. Negative for appetite change, chills, fever and unexpected weight change.  HENT:   Negative for hearing loss, lump/mass and trouble swallowing.   Eyes:  Negative for eye problems and icterus.  Respiratory:  Negative for chest tightness, cough and shortness of breath.   Cardiovascular:  Negative for chest pain, leg swelling and palpitations.  Gastrointestinal:  Negative for abdominal distention, abdominal pain, constipation, diarrhea, nausea and vomiting.  Endocrine: Positive for hot flashes.  Genitourinary:  Negative for difficulty urinating.   Musculoskeletal:  Negative for arthralgias.  Skin:  Negative for itching and rash.  Neurological:  Negative for dizziness, extremity weakness, headaches and numbness.  Hematological:  Negative for adenopathy. Does not bruise/bleed easily.  Psychiatric/Behavioral:  Negative for depression. The patient is not nervous/anxious.    Breast: Denies any new nodularity, masses, tenderness, nipple changes, or nipple discharge.    ONCOLOGY TREATMENT TEAM:  1. Surgeon:  Dr. Corliss Skains at [facility] 2. Medical Oncologist: Dr. Al Pimple  3. Radiation Oncologist: Dr. Mitzi Hansen    PAST MEDICAL/SURGICAL HISTORY:  Past Medical History:  Diagnosis Date   Arthritis    osteo   Breast cancer (HCC) 03/20/2022   Depression    Epiretinal membrane 02/05/2017   Sees Dr. Cathey Endow and Dr. Lita Mains   Essential hypertension 04/27/2014   FH: migraines    GAD (generalized  anxiety disorder) 08/04/2015   Hepatitis B    reports dx and tx as a young child, reports no issues since and retested as adult and told did not have hepatitis, she is unsure of type of hep she had   Thyroid disease    Past Surgical History:  Procedure Laterality Date    APPENDECTOMY     BREAST LUMPECTOMY WITH RADIOACTIVE SEED LOCALIZATION Left 03/20/2022   Procedure: LEFT BREAST LUMPECTOMY WITH RADIOACTIVE SEED LOCALIZATION;  Surgeon: Manus Rudd, MD;  Location[facility];  Service: General;  Laterality: Left;   CATARACT EXTRACTION     CHOLECYSTECTOMY     ELBOW SURGERY     shattered elbow   endometrial oblation     FOOT SURGERY     RETINAL DETACHMENT SURGERY     REVERSE SHOULDER ARTHROPLASTY Left 09/20/2021   Procedure: LEFT REVERSE SHOULDER ARTHROPLASTY;  Surgeon: Bjorn Pippin, MD;  Location: [facility];  Service: Orthopedics;  Laterality: Left;   TONSILLECTOMY AND ADENOIDECTOMY     TUBAL LIGATION       ALLERGIES:  Allergies  Allergen Reactions   Penicillins Hives    Childhood Reaction  Received Ancef on 03-20-2022 without issue     CURRENT MEDICATIONS:  Outpatient Encounter Medications as of 04/04/2023  Medication Sig   potassium chloride SA (KLOR-CON M) 20 MEQ tablet Take 1 tablet (20 mEq total) by mouth 2 (two) times daily for 5 days.   anastrozole (ARIMIDEX) 1 MG tablet Take 1 tablet (1 mg total) by mouth daily.   carboxymethylcellulose (REFRESH PLUS) 0.5 % SOLN Place 1 drop into both eyes 3 (three) times daily as needed (dry eyes).   Cholecalciferol (SM VITAMIN D3) 100 MCG (4000 UT) CAPS Take 1 capsule by mouth daily. DV3 + Immune Support   hydrochlorothiazide (HYDRODIURIL) 25 MG tablet TAKE 1 TABLET BY MOUTH DAILY   levothyroxine (SYNTHROID) 175 MCG tablet TAKE ONE TABLET BY MOUTH EVERY MORNING BEFORE BREAKFAST   NON FORMULARY Take 1 capsule by mouth daily. Doterra  Food Nutrient Complex   olmesartan (BENICAR) 5 MG tablet TAKE 2 TABLETS BY MOUTH DAILY   No facility-administered encounter medications on file as of 04/04/2023.     ONCOLOGIC FAMILY HISTORY:  Family History  Problem Relation Age of Onset   Testicular cancer Brother      SOCIAL HISTORY:  Social History   Socioeconomic History   Marital status:  Widowed    Spouse name: Not on file   Number of children: Not on file   Years of education: Not on file   Highest education level: Master's degree (e.g., MA, MS, MEng, MEd, MSW, MBA)  Occupational History   Not on file  Tobacco Use   Smoking status: Never   Smokeless tobacco: Never  Substance and Sexual Activity   Alcohol use: Not Currently    Comment: stopped 5 years ago per patient   Drug use: No   Sexual activity: Not on file  Other Topics Concern   Not on file  Social History Narrative   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[employer], 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, DAR      Home Situation: Widowed      Spiritual Beliefs: Christian      Lifestyle: some walking; working on diet      Social Determinants of Health   Financial Resource Strain: Low Risk  (12/24/2022)   Overall Financial Resource Strain (CARDIA)    Difficulty of Paying Living Expenses: Not hard at all  Food Insecurity:  No Food Insecurity (12/24/2022)   Hunger Vital Sign    Worried About Running Out of Food in the Last Year: Never true    Ran Out of Food in the Last Year: Never true  Transportation Needs: No Transportation Needs (12/24/2022)   PRAPARE - Administrator, Civil Service (Medical): No    Lack of Transportation (Non-Medical): No  Physical Activity: Sufficiently Active (12/24/2022)   Exercise Vital Sign    Days of Exercise per Week: 4 days    Minutes of Exercise per Session: 60 min  Stress: No Stress Concern Present (12/24/2022)   [employer] of Occupational Health - Occupational Stress Questionnaire    Feeling of Stress : Not at all  Social Connections: Moderately Integrated (12/24/2022)   Social Connection and Isolation Panel [NHANES]    Frequency of Communication with Friends and Family: More than three times a week    Frequency of Social Gatherings with Friends and Family: More than three times a week    Attends Religious Services: More than 4 times per year    Active Member of [employer] or  Organizations: Yes    Attends Banker Meetings: More than 4 times per year    Marital Status: Widowed  Intimate Partner Violence: Not At Risk (12/24/2022)   Humiliation, Afraid, Rape, and Kick questionnaire    Fear of Current or Ex-Partner: No    Emotionally Abused: No    Physically Abused: No    Sexually Abused: No     OBSERVATIONS/OBJECTIVE:  BP (!) 126/56 (BP Location: Left Arm, Patient Position: Sitting) Comment: MD notfied.  Pulse 74   Temp (!) 97.2 F (36.2 C) (Temporal)   Resp 18   Ht 5\' 4"  (1.626 m)   Wt 217 lb 2 oz (98.5 kg)   SpO2 98%   BMI 37.27 kg/m  GENERAL: Patient is a well appearing female in no acute distress Breast: Bilateral breasts inspected and palpated.  No masses or regional adenopathy. No lower extremity edema  LABORATORY DATA:  None for this visit.  DIAGNOSTIC IMAGING:  None for this visit.      ASSESSMENT AND PLAN:  Karina Allen is a pleasant 72 y.o. female with Stage 0 left breast DCIS, ER+/PR+, diagnosed in 03/2022, treated with lumpectomy, adjuvant radiation therapy, and anti-estrogen therapy with Anastrozole beginning in 06/2022.  Patient is tolerating anastrozole extremely well.  She has a rare hot flash here and there.  She otherwise denies any new health complaints.  She had right shoulder replacement and is working with physical therapy and is recovering as expected.  She had to reschedule her mammogram which is now scheduled for mid June.  She denies any breast changes.  On physical exam there are no concerns in bilateral breasts. BMD showed osteopenia.  I encouraged her to continue walking, vitamin D and calcium supplementation.  We have discussed about bisphosphonates and osteopenia when used concomitantly with aromatase inhibitors.  She is not interested in pursuing this at this time.  All her questions were answered to the best my knowledge.  She will return to clinic in 6 months or sooner as needed.  Total encounter time 20  minutes*in face-to-face visit time, chart review, lab review, care coordination, order entry, and documentation of the encounter time.    *Total Encounter Time as defined by the Centers for Medicare and Medicaid Services includes, in addition to the face-to-face time of a patient visit (documented in the note above) non-face-to-face time: obtaining and reviewing  outside history, ordering and reviewing medications, tests or procedures, care coordination (communications with other health care professionals or caregivers) and documentation in the medical record.

## 2023-04-05 DIAGNOSIS — M19011 Primary osteoarthritis, right shoulder: Secondary | ICD-10-CM | POA: Diagnosis not present

## 2023-04-05 DIAGNOSIS — M6281 Muscle weakness (generalized): Secondary | ICD-10-CM | POA: Diagnosis not present

## 2023-04-05 DIAGNOSIS — Z96611 Presence of right artificial shoulder joint: Secondary | ICD-10-CM | POA: Diagnosis not present

## 2023-04-05 DIAGNOSIS — M25611 Stiffness of right shoulder, not elsewhere classified: Secondary | ICD-10-CM | POA: Diagnosis not present

## 2023-04-09 DIAGNOSIS — M6281 Muscle weakness (generalized): Secondary | ICD-10-CM | POA: Diagnosis not present

## 2023-04-09 DIAGNOSIS — M19011 Primary osteoarthritis, right shoulder: Secondary | ICD-10-CM | POA: Diagnosis not present

## 2023-04-09 DIAGNOSIS — M25611 Stiffness of right shoulder, not elsewhere classified: Secondary | ICD-10-CM | POA: Diagnosis not present

## 2023-04-09 DIAGNOSIS — Z96611 Presence of right artificial shoulder joint: Secondary | ICD-10-CM | POA: Diagnosis not present

## 2023-04-23 DIAGNOSIS — M19011 Primary osteoarthritis, right shoulder: Secondary | ICD-10-CM | POA: Diagnosis not present

## 2023-04-23 DIAGNOSIS — Z96611 Presence of right artificial shoulder joint: Secondary | ICD-10-CM | POA: Diagnosis not present

## 2023-04-23 DIAGNOSIS — M6281 Muscle weakness (generalized): Secondary | ICD-10-CM | POA: Diagnosis not present

## 2023-04-23 DIAGNOSIS — M25611 Stiffness of right shoulder, not elsewhere classified: Secondary | ICD-10-CM | POA: Diagnosis not present

## 2023-04-24 ENCOUNTER — Ambulatory Visit
Admission: RE | Admit: 2023-04-24 | Discharge: 2023-04-24 | Disposition: A | Discharge status: Home / Self Care | Payer: Medicare PPO | Source: Ambulatory Visit | Attending: Adult Health | Admitting: Adult Health

## 2023-04-24 DIAGNOSIS — Z853 Personal history of malignant neoplasm of breast: Secondary | ICD-10-CM | POA: Diagnosis not present

## 2023-04-24 DIAGNOSIS — C50512 Malignant neoplasm of lower-outer quadrant of left female breast: Secondary | ICD-10-CM

## 2023-04-24 HISTORY — DX: Personal history of irradiation: Z92.3

## 2023-04-26 DIAGNOSIS — M19011 Primary osteoarthritis, right shoulder: Secondary | ICD-10-CM | POA: Diagnosis not present

## 2023-04-26 DIAGNOSIS — M25611 Stiffness of right shoulder, not elsewhere classified: Secondary | ICD-10-CM | POA: Diagnosis not present

## 2023-04-26 DIAGNOSIS — M6281 Muscle weakness (generalized): Secondary | ICD-10-CM | POA: Diagnosis not present

## 2023-04-26 DIAGNOSIS — Z96611 Presence of right artificial shoulder joint: Secondary | ICD-10-CM | POA: Diagnosis not present

## 2023-04-30 DIAGNOSIS — M25611 Stiffness of right shoulder, not elsewhere classified: Secondary | ICD-10-CM | POA: Diagnosis not present

## 2023-04-30 DIAGNOSIS — M6281 Muscle weakness (generalized): Secondary | ICD-10-CM | POA: Diagnosis not present

## 2023-04-30 DIAGNOSIS — Z96611 Presence of right artificial shoulder joint: Secondary | ICD-10-CM | POA: Diagnosis not present

## 2023-04-30 DIAGNOSIS — M19011 Primary osteoarthritis, right shoulder: Secondary | ICD-10-CM | POA: Diagnosis not present

## 2023-05-03 DIAGNOSIS — Z96611 Presence of right artificial shoulder joint: Secondary | ICD-10-CM | POA: Diagnosis not present

## 2023-05-03 DIAGNOSIS — M25611 Stiffness of right shoulder, not elsewhere classified: Secondary | ICD-10-CM | POA: Diagnosis not present

## 2023-05-03 DIAGNOSIS — M19011 Primary osteoarthritis, right shoulder: Secondary | ICD-10-CM | POA: Diagnosis not present

## 2023-05-03 DIAGNOSIS — M6281 Muscle weakness (generalized): Secondary | ICD-10-CM | POA: Diagnosis not present

## 2023-05-07 DIAGNOSIS — M25611 Stiffness of right shoulder, not elsewhere classified: Secondary | ICD-10-CM | POA: Diagnosis not present

## 2023-05-07 DIAGNOSIS — Z96611 Presence of right artificial shoulder joint: Secondary | ICD-10-CM | POA: Diagnosis not present

## 2023-05-07 DIAGNOSIS — M19011 Primary osteoarthritis, right shoulder: Secondary | ICD-10-CM | POA: Diagnosis not present

## 2023-05-07 DIAGNOSIS — M6281 Muscle weakness (generalized): Secondary | ICD-10-CM | POA: Diagnosis not present

## 2023-05-15 DIAGNOSIS — M25611 Stiffness of right shoulder, not elsewhere classified: Secondary | ICD-10-CM | POA: Diagnosis not present

## 2023-05-15 DIAGNOSIS — M19011 Primary osteoarthritis, right shoulder: Secondary | ICD-10-CM | POA: Diagnosis not present

## 2023-05-15 DIAGNOSIS — M6281 Muscle weakness (generalized): Secondary | ICD-10-CM | POA: Diagnosis not present

## 2023-05-15 DIAGNOSIS — Z96611 Presence of right artificial shoulder joint: Secondary | ICD-10-CM | POA: Diagnosis not present

## 2023-05-17 DIAGNOSIS — M19011 Primary osteoarthritis, right shoulder: Secondary | ICD-10-CM | POA: Diagnosis not present

## 2023-05-21 DIAGNOSIS — M25611 Stiffness of right shoulder, not elsewhere classified: Secondary | ICD-10-CM | POA: Diagnosis not present

## 2023-05-21 DIAGNOSIS — Z96611 Presence of right artificial shoulder joint: Secondary | ICD-10-CM | POA: Diagnosis not present

## 2023-05-21 DIAGNOSIS — M19011 Primary osteoarthritis, right shoulder: Secondary | ICD-10-CM | POA: Diagnosis not present

## 2023-05-21 DIAGNOSIS — M6281 Muscle weakness (generalized): Secondary | ICD-10-CM | POA: Diagnosis not present

## 2023-05-28 DIAGNOSIS — M25611 Stiffness of right shoulder, not elsewhere classified: Secondary | ICD-10-CM | POA: Diagnosis not present

## 2023-05-28 DIAGNOSIS — M19011 Primary osteoarthritis, right shoulder: Secondary | ICD-10-CM | POA: Diagnosis not present

## 2023-05-28 DIAGNOSIS — Z96611 Presence of right artificial shoulder joint: Secondary | ICD-10-CM | POA: Diagnosis not present

## 2023-05-28 DIAGNOSIS — M6281 Muscle weakness (generalized): Secondary | ICD-10-CM | POA: Diagnosis not present

## 2023-05-30 DIAGNOSIS — L570 Actinic keratosis: Secondary | ICD-10-CM | POA: Diagnosis not present

## 2023-06-04 DIAGNOSIS — Z96611 Presence of right artificial shoulder joint: Secondary | ICD-10-CM | POA: Diagnosis not present

## 2023-06-04 DIAGNOSIS — M25611 Stiffness of right shoulder, not elsewhere classified: Secondary | ICD-10-CM | POA: Diagnosis not present

## 2023-06-04 DIAGNOSIS — M19011 Primary osteoarthritis, right shoulder: Secondary | ICD-10-CM | POA: Diagnosis not present

## 2023-06-04 DIAGNOSIS — M6281 Muscle weakness (generalized): Secondary | ICD-10-CM | POA: Diagnosis not present

## 2023-06-05 ENCOUNTER — Encounter (INDEPENDENT_AMBULATORY_CARE_PROVIDER_SITE_OTHER): Payer: Self-pay

## 2023-06-11 DIAGNOSIS — Z96611 Presence of right artificial shoulder joint: Secondary | ICD-10-CM | POA: Diagnosis not present

## 2023-06-11 DIAGNOSIS — M6281 Muscle weakness (generalized): Secondary | ICD-10-CM | POA: Diagnosis not present

## 2023-06-11 DIAGNOSIS — M25611 Stiffness of right shoulder, not elsewhere classified: Secondary | ICD-10-CM | POA: Diagnosis not present

## 2023-06-11 DIAGNOSIS — M19011 Primary osteoarthritis, right shoulder: Secondary | ICD-10-CM | POA: Diagnosis not present

## 2023-06-17 ENCOUNTER — Other Ambulatory Visit: Payer: Self-pay | Admitting: *Deleted

## 2023-06-17 MED ORDER — ANASTROZOLE 1 MG PO TABS: 1.0000 mg | ORAL_TABLET | Freq: Every day | ORAL | 3 refills | Status: DC

## 2023-06-18 DIAGNOSIS — Z96611 Presence of right artificial shoulder joint: Secondary | ICD-10-CM | POA: Diagnosis not present

## 2023-06-18 DIAGNOSIS — M6281 Muscle weakness (generalized): Secondary | ICD-10-CM | POA: Diagnosis not present

## 2023-06-18 DIAGNOSIS — M25611 Stiffness of right shoulder, not elsewhere classified: Secondary | ICD-10-CM | POA: Diagnosis not present

## 2023-06-18 DIAGNOSIS — M19011 Primary osteoarthritis, right shoulder: Secondary | ICD-10-CM | POA: Diagnosis not present

## 2023-06-27 ENCOUNTER — Ambulatory Visit: Payer: Medicare PPO | Admitting: Internal Medicine

## 2023-07-11 DIAGNOSIS — L821 Other seborrheic keratosis: Secondary | ICD-10-CM | POA: Diagnosis not present

## 2023-07-11 DIAGNOSIS — Z7189 Other specified counseling: Secondary | ICD-10-CM | POA: Diagnosis not present

## 2023-07-11 DIAGNOSIS — D225 Melanocytic nevi of trunk: Secondary | ICD-10-CM | POA: Diagnosis not present

## 2023-07-11 DIAGNOSIS — L814 Other melanin hyperpigmentation: Secondary | ICD-10-CM | POA: Diagnosis not present

## 2023-08-02 ENCOUNTER — Other Ambulatory Visit: Payer: Self-pay | Admitting: Internal Medicine

## 2023-08-02 DIAGNOSIS — E038 Other specified hypothyroidism: Secondary | ICD-10-CM

## 2023-08-02 DIAGNOSIS — I10 Essential (primary) hypertension: Secondary | ICD-10-CM

## 2023-08-05 ENCOUNTER — Ambulatory Visit: Payer: Medicare PPO | Admitting: Internal Medicine

## 2023-08-05 DIAGNOSIS — R7302 Impaired glucose tolerance (oral): Secondary | ICD-10-CM

## 2023-08-15 ENCOUNTER — Ambulatory Visit: Payer: Medicare PPO | Admitting: Internal Medicine

## 2023-08-21 ENCOUNTER — Ambulatory Visit: Payer: Medicare PPO | Admitting: Internal Medicine

## 2023-08-21 ENCOUNTER — Encounter: Payer: Self-pay | Admitting: Internal Medicine

## 2023-08-21 VITALS — BP 120/80 | HR 67 | Temp 97.8°F | Wt 213.6 lb

## 2023-08-21 DIAGNOSIS — R7302 Impaired glucose tolerance (oral): Secondary | ICD-10-CM

## 2023-08-21 DIAGNOSIS — I10 Essential (primary) hypertension: Secondary | ICD-10-CM | POA: Diagnosis not present

## 2023-08-21 DIAGNOSIS — Z170 Estrogen receptor positive status [ER+]: Secondary | ICD-10-CM | POA: Diagnosis not present

## 2023-08-21 DIAGNOSIS — E7800 Pure hypercholesterolemia, unspecified: Secondary | ICD-10-CM

## 2023-08-21 DIAGNOSIS — E038 Other specified hypothyroidism: Secondary | ICD-10-CM | POA: Diagnosis not present

## 2023-08-21 DIAGNOSIS — C50512 Malignant neoplasm of lower-outer quadrant of left female breast: Secondary | ICD-10-CM | POA: Diagnosis not present

## 2023-08-21 LAB — POCT GLYCOSYLATED HEMOGLOBIN (HGB A1C): Hemoglobin A1C: 5.6 % (ref 4.0–5.6)

## 2023-08-21 NOTE — Progress Notes (Signed)
Established Patient Office Visit     CC/Reason for Visit: Follow-up chronic conditions  HPI: Karina Allen is a 72 y.o. female who is coming in today for the above mentioned reasons. Past Medical History is significant for: Hypertension, hyperlipidemia, impaired glucose tolerance, morbid obesity, left DCIS.  She has been doing well.  She declines all vaccines.  Cancer screening is up-to-date.   Past Medical/Surgical History: Past Medical History:  Diagnosis Date   Arthritis    osteo   Breast cancer (HCC) 03/20/2022   Depression    Epiretinal membrane 02/05/2017   Sees Dr. Cathey Endow and Dr. Lita Mains   Essential hypertension 04/27/2014   FH: migraines    GAD (generalized anxiety disorder) 08/04/2015   Hepatitis B    reports dx and tx as a young child, reports no issues since and retested as adult and told did not have hepatitis, she is unsure of type of hep she had   Personal history of radiation therapy    Thyroid disease     Past Surgical History:  Procedure Laterality Date   APPENDECTOMY     BREAST LUMPECTOMY WITH RADIOACTIVE SEED LOCALIZATION Left 03/20/2022   Procedure: LEFT BREAST LUMPECTOMY WITH RADIOACTIVE SEED LOCALIZATION;  Surgeon: Manus Rudd, MD;  Location: [facility] OR;  Service: General;  Laterality: Left;   CATARACT EXTRACTION     CHOLECYSTECTOMY     ELBOW SURGERY     shattered elbow   endometrial oblation     FOOT SURGERY     RETINAL DETACHMENT SURGERY     REVERSE SHOULDER ARTHROPLASTY Left 09/20/2021   Procedure: LEFT REVERSE SHOULDER ARTHROPLASTY;  Surgeon: Bjorn Pippin, MD;  Location: [facility];  Service: Orthopedics;  Laterality: Left;   TONSILLECTOMY AND ADENOIDECTOMY     TUBAL LIGATION      Social History:  reports that she has never smoked. She has never used smokeless tobacco. She reports that she does not currently use alcohol. She reports that she does not use drugs.  Allergies: Allergies  Allergen Reactions   Penicillins  Hives    Childhood Reaction  Received Ancef on 03-20-2022 without issue    Family History:  Family History  Problem Relation Age of Onset   Testicular cancer Brother      Current Outpatient Medications:    anastrozole (ARIMIDEX) 1 MG tablet, Take 1 tablet (1 mg total) by mouth daily., Disp: 90 tablet, Rfl: 3   carboxymethylcellulose (REFRESH PLUS) 0.5 % SOLN, Place 1 drop into both eyes 3 (three) times daily as needed (dry eyes)., Disp: , Rfl:    Cholecalciferol (SM VITAMIN D3) 100 MCG (4000 UT) CAPS, Take 1 capsule by mouth daily. DV3 + Immune Support, Disp: , Rfl:    hydrochlorothiazide (HYDRODIURIL) 25 MG tablet, TAKE 1 TABLET BY MOUTH DAILY, Disp: 90 tablet, Rfl: 1   NON FORMULARY, Take 1 capsule by mouth daily. Doterra  Food Nutrient Complex, Disp: , Rfl:    olmesartan (BENICAR) 5 MG tablet, TAKE 2 TABLETS BY MOUTH DAILY, Disp: 180 tablet, Rfl: 1   SYNTHROID 175 MCG tablet, TAKE ONE TABLET BY MOUTH EVERY MORNING BEFORE BREAKFAST, Disp: 90 tablet, Rfl: 1  Review of Systems:  Negative unless indicated in HPI.   Physical Exam: Vitals:   08/21/23 0932  BP: 120/80  Pulse: 67  Temp: 97.8 F (36.6 C)  TempSrc: Oral  SpO2: 99%  Weight: 213 lb 9.6 oz (96.9 kg)    Body mass index is 36.66 kg/m.  Physical Exam Vitals reviewed.  Constitutional:      Appearance: Normal appearance.  HENT:     Head: Normocephalic and atraumatic.  Eyes:     Conjunctiva/sclera: Conjunctivae normal.     Pupils: Pupils are equal, round, and reactive to light.  Cardiovascular:     Rate and Rhythm: Normal rate and regular rhythm.  Pulmonary:     Effort: Pulmonary effort is normal.     Breath sounds: Normal breath sounds.  Skin:    General: Skin is warm and dry.  Neurological:     General: No focal deficit present.     Mental Status: She is alert and oriented to person, place, and time.  Psychiatric:        Mood and Affect: Mood normal.        Behavior: Behavior normal.        Thought  Content: Thought content normal.        Judgment: Judgment normal.      Impression and Plan:  IGT (impaired glucose tolerance) Assessment & Plan: Stable with an A1c of 5.6.  Orders: -     POCT glycosylated hemoglobin (Hb A1C)  Essential hypertension Assessment & Plan: Well-controlled on current.   Other specified hypothyroidism Assessment & Plan: Recent TSH within range on 175 mcg of levothyroxine.   Pure hypercholesterolemia Assessment & Plan: At goal, not on cholesterol medication.   Morbid obesity (HCC) Assessment & Plan: -Discussed healthy lifestyle, including increased physical activity and better food choices to promote weight loss.    Malignant neoplasm of lower-outer quadrant of left breast of female, estrogen receptor positive (HCC) Assessment & Plan: Followed by oncology, on anastrozole.      Time spent:33 minutes reviewing chart, interviewing and examining patient and formulating plan of care.     Chaya Jan, MD [facility]

## 2023-08-21 NOTE — Assessment & Plan Note (Signed)
Stable with an A1c of 5.6.

## 2023-08-21 NOTE — Assessment & Plan Note (Signed)
Recent TSH within range on 175 mcg of levothyroxine.

## 2023-08-21 NOTE — Assessment & Plan Note (Signed)
Discussed healthy lifestyle, including increased physical activity and better food choices to promote weight loss.

## 2023-08-21 NOTE — Assessment & Plan Note (Signed)
At goal, not on cholesterol medication.

## 2023-08-21 NOTE — Assessment & Plan Note (Signed)
Well controlled on current

## 2023-08-21 NOTE — Assessment & Plan Note (Signed)
Followed by oncology, on anastrozole.

## 2023-09-03 DIAGNOSIS — H1131 Conjunctival hemorrhage, right eye: Secondary | ICD-10-CM | POA: Diagnosis not present

## 2023-10-07 ENCOUNTER — Inpatient Hospital Stay: Payer: Medicare PPO | Attending: Hematology and Oncology | Admitting: Hematology and Oncology

## 2023-10-07 VITALS — BP 135/55 | HR 62 | Temp 97.6°F | Resp 16 | Wt 206.1 lb

## 2023-10-07 DIAGNOSIS — Z923 Personal history of irradiation: Secondary | ICD-10-CM | POA: Insufficient documentation

## 2023-10-07 DIAGNOSIS — Z170 Estrogen receptor positive status [ER+]: Secondary | ICD-10-CM | POA: Insufficient documentation

## 2023-10-07 DIAGNOSIS — Z79811 Long term (current) use of aromatase inhibitors: Secondary | ICD-10-CM | POA: Diagnosis not present

## 2023-10-07 DIAGNOSIS — C50512 Malignant neoplasm of lower-outer quadrant of left female breast: Secondary | ICD-10-CM | POA: Diagnosis not present

## 2023-10-07 NOTE — Progress Notes (Signed)
BRIEF ONCOLOGIC HISTORY:  Oncology History  Malignant neoplasm of lower-outer quadrant of left breast of female, estrogen receptor positive (HCC)  02/07/2022 Mammogram   Screening mammogram from February 07, 2022 showed calcifications in the left breast.  Diagnostic mammogram confirmed a group of indeterminate microcalcifications in the lateral lower left breast measuring 1 x 0.4 x 8.8 cm.    03/05/2022 Pathology Results   Pathology from the left breast showed DCIS with predominantly cribriform growth pattern and scattered foci of intraluminal microcalcification, grade 2, ER 100% positive strong staining, PR 70% positive moderate staining and Ki-67 of 2%   03/12/2022 Initial Diagnosis   Malignant neoplasm of lower-outer quadrant of left breast of female, estrogen receptor positive (HCC).    03/20/2022 Surgery   She had left breast lumpectomy which showed focus of residual ductal carcinoma in situ with calcifications, intermediate grade, 2.5 mm.  DCIS is 0.1 cm from the inked medial margin   03/20/2022 Cancer Staging   Staging form: Breast, AJCC 8th Edition - Pathologic stage from 03/20/2022: Stage 0 (pTis (DCIS), pN0, cM0, ER+, PR+) - Signed by Loa Socks, NP on 09/13/2022   05/03/2022 - 05/31/2022 Radiation Therapy   Site Technique Total Dose (Gy) Dose per Fx (Gy) Completed Fx Beam Energies  Breast, Left: Breast_L 3D 42.56/42.56 2.66 16/16 10XFFF  Breast, Left: Breast_L_Bst 3D 10/10 2.5 4/4 6X, 10X     06/2022 -  Anti-estrogen oral therapy   Anastrozole daily     INTERVAL HISTORY:   The patient, with a history of breast cancer, presents for a routine follow-up. She reports no issues with her current regimen of anastrozole, vitamin D, and calcium. She has been able to maintain a healthy lifestyle, walking about two miles a day and has even lost some weight. She has not noticed any changes in her breast. She has a concern about a recent blood pressure reading where the diastolic  number was 55, which she has never seen that low before. She also mentions that she has completed physical therapy for her shoulder, although it is not completely back to normal yet. She has good movement in it, but reaching behind is still a challenge. Rest of the pertinent 10  point ROS reviewed and neg.  ONCOLOGY TREATMENT TEAM:  1. Surgeon:  Dr. Corliss Skains at [facility] 2. Medical Oncologist: Dr. Al Pimple  3. Radiation Oncologist: Dr. Mitzi Hansen    PAST MEDICAL/SURGICAL HISTORY:  Past Medical History:  Diagnosis Date   Arthritis    osteo   Breast cancer (HCC) 03/20/2022   Depression    Epiretinal membrane 02/05/2017   Sees Dr. Cathey Endow and Dr. Lita Mains   Essential hypertension 04/27/2014   FH: migraines    GAD (generalized anxiety disorder) 08/04/2015   Hepatitis B    reports dx and tx as a young child, reports no issues since and retested as adult and told did not have hepatitis, she is unsure of type of hep she had   Personal history of radiation therapy    Thyroid disease    Past Surgical History:  Procedure Laterality Date   APPENDECTOMY     BREAST LUMPECTOMY WITH RADIOACTIVE SEED LOCALIZATION Left 03/20/2022   Procedure: LEFT BREAST LUMPECTOMY WITH RADIOACTIVE SEED LOCALIZATION;  Surgeon: Manus Rudd, MD;  Location: [facility] OR;  Service: General;  Laterality: Left;   CATARACT EXTRACTION     CHOLECYSTECTOMY     ELBOW SURGERY     shattered elbow   endometrial oblation     FOOT  SURGERY     RETINAL DETACHMENT SURGERY     REVERSE SHOULDER ARTHROPLASTY Left 09/20/2021   Procedure: LEFT REVERSE SHOULDER ARTHROPLASTY;  Surgeon: Bjorn Pippin, MD;  Location: [facility];  Service: Orthopedics;  Laterality: Left;   TONSILLECTOMY AND ADENOIDECTOMY     TUBAL LIGATION       ALLERGIES:  Allergies  Allergen Reactions   Penicillins Hives    Childhood Reaction  Received Ancef on 03-20-2022 without issue     CURRENT MEDICATIONS:  Outpatient Encounter Medications as  of 10/07/2023  Medication Sig   anastrozole (ARIMIDEX) 1 MG tablet Take 1 tablet (1 mg total) by mouth daily.   carboxymethylcellulose (REFRESH PLUS) 0.5 % SOLN Place 1 drop into both eyes 3 (three) times daily as needed (dry eyes).   Cholecalciferol (SM VITAMIN D3) 100 MCG (4000 UT) CAPS Take 1 capsule by mouth daily. DV3 + Immune Support   hydrochlorothiazide (HYDRODIURIL) 25 MG tablet TAKE 1 TABLET BY MOUTH DAILY   NON FORMULARY Take 1 capsule by mouth daily. Doterra  Food Nutrient Complex   olmesartan (BENICAR) 5 MG tablet TAKE 2 TABLETS BY MOUTH DAILY   SYNTHROID 175 MCG tablet TAKE ONE TABLET BY MOUTH EVERY MORNING BEFORE BREAKFAST   No facility-administered encounter medications on file as of 10/07/2023.     ONCOLOGIC FAMILY HISTORY:  Family History  Problem Relation Age of Onset   Testicular cancer Brother      SOCIAL HISTORY:  Social History   Socioeconomic History   Marital status: Widowed    Spouse name: Not on file   Number of children: Not on file   Years of education: Not on file   Highest education level: Master's degree (e.g., MA, MS, MEng, MEd, MSW, MBA)  Occupational History   Not on file  Tobacco Use   Smoking status: Never   Smokeless tobacco: Never  Substance and Sexual Activity   Alcohol use: Not Currently    Comment: stopped 5 years ago per patient   Drug use: No   Sexual activity: Not on file  Other Topics Concern   Not on file  Social History Narrative   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[employer], 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, DAR      Home Situation: Widowed      Spiritual Beliefs: Christian      Lifestyle: some walking; working on diet      Social Determinants of Health   Financial Resource Strain: Low Risk  (08/11/2023)   Overall Financial Resource Strain (CARDIA)    Difficulty of Paying Living Expenses: Not hard at all  Food Insecurity: No Food Insecurity (08/11/2023)   Hunger Vital Sign    Worried About Running Out of Food in the Last Year:  Never true    Ran Out of Food in the Last Year: Never true  Transportation Needs: No Transportation Needs (08/11/2023)   PRAPARE - Administrator, Civil Service (Medical): No    Lack of Transportation (Non-Medical): No  Physical Activity: Sufficiently Active (08/11/2023)   Exercise Vital Sign    Days of Exercise per Week: 3 days    Minutes of Exercise per Session: 50 min  Stress: No Stress Concern Present (08/11/2023)   [employer] of Occupational Health - Occupational Stress Questionnaire    Feeling of Stress : Not at all  Social Connections: Moderately Integrated (08/11/2023)   Social Connection and Isolation Panel [NHANES]    Frequency of Communication with Friends and Family: More than three  times a week    Frequency of Social Gatherings with Friends and Family: More than three times a week    Attends Religious Services: More than 4 times per year    Active Member of Clubs or Organizations: Yes    Attends Banker Meetings: More than 4 times per year    Marital Status: Widowed  Intimate Partner Violence: Not At Risk (12/24/2022)   Humiliation, Afraid, Rape, and Kick questionnaire    Fear of Current or Ex-Partner: No    Emotionally Abused: No    Physically Abused: No    Sexually Abused: No     OBSERVATIONS/OBJECTIVE:  There were no vitals taken for this visit. GENERAL: Patient is a well appearing female in no acute distress Breast: Bilateral breasts inspected and palpated.  No masses or regional adenopathy. No lower extremity edema  LABORATORY DATA:  None for this visit.  DIAGNOSTIC IMAGING:  None for this visit.      ASSESSMENT AND PLAN:  Ms.. Penix is a pleasant 72 y.o. female with Stage 0 left breast DCIS, ER+/PR+, diagnosed in 03/2022, treated with lumpectomy, adjuvant radiation therapy, and anti-estrogen therapy with Anastrozole beginning in 06/2022.  Patient is tolerating anastrozole extremely well. She otherwise denies any new health  complaints.    Breast Cancer No reported changes in the breast. Adherence to Anastrozole without reported side effects. -Continue Anastrozole as prescribed. No concerns on exam. -Order mammogram for June 2025.  Hypertension Patient reported a single low diastolic blood pressure reading. No symptoms reported. -Advise patient to monitor blood pressure at home and report any consistently low readings.  Shoulder Rehabilitation Completed physical therapy with good range of motion, but not yet 100%. -Continue home exercises as tolerated.  General Health Maintenance -Continue daily walking regimen. -Continue Vitamin D and Calcium supplementation. -Follow-up in summer 2025.   Total encounter time 20 minutes*in face-to-face visit time, chart review, lab review, care coordination, order entry, and documentation of the encounter time.    *Total Encounter Time as defined by the Centers for Medicare and Medicaid Services includes, in addition to the face-to-face time of a patient visit (documented in the note above) non-face-to-face time: obtaining and reviewing outside history, ordering and reviewing medications, tests or procedures, care coordination (communications with other health care professionals or caregivers) and documentation in the medical record.

## 2023-12-31 ENCOUNTER — Encounter: Payer: Medicare PPO | Admitting: Family Medicine

## 2024-01-02 ENCOUNTER — Encounter: Payer: Self-pay | Admitting: Family Medicine

## 2024-01-02 ENCOUNTER — Ambulatory Visit: Payer: Medicare PPO | Admitting: Family Medicine

## 2024-01-02 DIAGNOSIS — Z0000 Encounter for general adult medical examination without abnormal findings: Secondary | ICD-10-CM

## 2024-01-02 NOTE — Progress Notes (Signed)
 PATIENT CHECK-IN and HEALTH RISK ASSESSMENT QUESTIONNAIRE:  -completed by phone/video for upcoming Medicare Preventive Visit  -PLEASE SELECT "NOT IN PERSON" for the method of visit.   Pre-Visit Check-in: 1)Vitals (height, wt, BP, etc) - record in vitals section for visit on day of visit Request home vitals (wt, BP, etc.) and enter into vitals, THEN update Vital Signs SmartPhrase below at the top of the HPI. See below.  2)Review and Update Medications, Allergies PMH, Surgeries, Social history in Epic 3)Hospitalizations in the last year with date/reason? No  4)Review and Update Care Team (patient's specialists) in Epic 5) Complete PHQ9 in Epic  6) Complete Fall Screening in Epic 7)Review all Health Maintenance Due and order under PCP if not done.  Medicare Wellness Patient Questionnaire:  Answer theses question about your habits: How often do you have a drink containing alcohol?No How many drinks containing alcohol do you have on a typical day when you are drinking?N/A How often do you have six or more drinks on one occasion?N/A Have you ever smoked?No Quit date if applicable? N/A  How many packs a day do/did you smoke? N/A Do you use smokeless tobacco?No Do you use an illicit drugs?No On average, how many days per week do you engage in moderate to strenuous exercise (like a brisk walk)?5 days, swimming Monday and Friday, line dancing daily, walking, also doing some hand weights and exercises from PT - and some balance exercises On average, how many minutes do you engage in exercise at this level?40 mins  Are you sexually active? No Number of partners? Typical breakfast: Cereal  Typical lunch: Varies  Typical dinner: Varies  Typical snacks: N/A   Beverages: Soda, Tea and water -goes to church, active in community and with family  Answer theses question about your everyday activities: Can you perform most household chores?Yes Are you deaf or have significant trouble hearing?No Do  you feel that you have a problem with memory?No Do you feel safe at home? Yes  Last dentist visit? 4 months ago  8. Do you have any difficulty performing your everyday activities?No Are you having any difficulty walking, taking medications on your own, and or difficulty managing daily home needs?No Do you have difficulty walking or climbing stairs?No Do you have difficulty dressing or bathing?No Do you have difficulty doing errands alone such as visiting a doctor's office or shopping?No Do you currently have any difficulty preparing food and eating?No Do you currently have any difficulty using the toilet?No Do you have any difficulty managing your finances?No Do you have any difficulties with housekeeping of managing your housekeeping?No   Do you have Advanced Directives in place (Living Will, Healthcare Power or Attorney)?  Yes   Last eye Exam and location? Dr. [facility]    Do you currently use prescribed or non-prescribed narcotic or opioid pain medications?No  Do you have a history or close family history of breast, ovarian, tubal or peritoneal cancer or a family member with BRCA (breast cancer susceptibility 1 and 2) gene mutations? No    ----------------------------------------------------------------------------------------------------------------------------------------------------------------------------------------------------------------------  Because this visit was a virtual/telehealth visit, some criteria may be missing or patient reported. Any vitals not documented were not able to be obtained and vitals that have been documented are patient reported.    MEDICARE ANNUAL PREVENTIVE VISIT WITH PROVIDER: (Welcome to Medicare, initial annual wellness or annual wellness exam)  Virtual Visit via Video Note  I connected with Karina Allen on 01/02/24 by phone and verified that I am speaking  with the correct person using two identifiers. She declined  video visit and prefers phone visit.   Location patient: home Location provider:work or home office Persons participating in the virtual visit: patient, provider  Concerns and/or follow up today: reports is doing white well.    See HM section in Epic for other details of completed HM.    ROS: negative for report of fevers, unintentional weight loss, vision changes, hearing loss or change, chest pain, sob, hemoptysis, melena, hematochezia, hematuria, falls, bleeding or bruising, thoughts of suicide or self harm, memory loss  Patient-completed extensive health risk assessment - reviewed and discussed with the patient: See Health Risk Assessment completed with patient prior to the visit either above or in recent phone note. This was reviewed in detailed with the patient today and appropriate recommendations, orders and referrals were placed as needed per Summary below and patient instructions.   Review of Medical History: -PMH, PSH, Family History and current specialty and care providers reviewed and updated and listed below   Patient Care Team: Philip Aspen, Limmie Patricia, MD as PCP - General (Internal Medicine) Rachel Moulds, MD as Consulting Physician (Hematology and Oncology) Dorothy Puffer, MD as Consulting Physician (Radiation Oncology) Manus Rudd, MD as Consulting Physician (General Surgery)   Past Medical History:  Diagnosis Date   Arthritis    osteo   Breast cancer (HCC) 03/20/2022   Depression    Epiretinal membrane 02/05/2017   Sees Dr. Cathey Endow and Dr. Lita Mains   Essential hypertension 04/27/2014   FH: migraines    GAD (generalized anxiety disorder) 08/04/2015   Hepatitis B    reports dx and tx as a young child, reports no issues since and retested as adult and told did not have hepatitis, she is unsure of type of hep she had   Personal history of radiation therapy    Thyroid disease     Past Surgical History:  Procedure Laterality Date   APPENDECTOMY     BREAST  LUMPECTOMY WITH RADIOACTIVE SEED LOCALIZATION Left 03/20/2022   Procedure: LEFT BREAST LUMPECTOMY WITH RADIOACTIVE SEED LOCALIZATION;  Surgeon: Manus Rudd, MD;  Location: [facility] OR;  Service: General;  Laterality: Left;   CATARACT EXTRACTION     CHOLECYSTECTOMY     ELBOW SURGERY     shattered elbow   endometrial oblation     FOOT SURGERY     RETINAL DETACHMENT SURGERY     REVERSE SHOULDER ARTHROPLASTY Left 09/20/2021   Procedure: LEFT REVERSE SHOULDER ARTHROPLASTY;  Surgeon: Bjorn Pippin, MD;  Location: [facility];  Service: Orthopedics;  Laterality: Left;   TONSILLECTOMY AND ADENOIDECTOMY     TUBAL LIGATION      Social History   Socioeconomic History   Marital status: Widowed    Spouse name: Not on file   Number of children: Not on file   Years of education: Not on file   Highest education level: Master's degree (e.g., MA, MS, MEng, MEd, MSW, MBA)  Occupational History   Not on file  Tobacco Use   Smoking status: Never   Smokeless tobacco: Never  Substance and Sexual Activity   Alcohol use: Not Currently    Comment: stopped 5 years ago per patient   Drug use: No   Sexual activity: Not on file  Other Topics Concern   Not on file  Social History Narrative   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[employer], 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, DAR      Home Situation: Widowed  Spiritual Beliefs: Christian      Lifestyle: some walking; working on diet      Social Drivers of Health   Financial Resource Strain: Low Risk  (01/01/2024)   Overall Financial Resource Strain (CARDIA)    Difficulty of Paying Living Expenses: Not hard at all  Food Insecurity: No Food Insecurity (01/01/2024)   Hunger Vital Sign    Worried About Running Out of Food in the Last Year: Never true    Ran Out of Food in the Last Year: Never true  Transportation Needs: No Transportation Needs (01/01/2024)   PRAPARE - Administrator, Civil Service (Medical): No    Lack of Transportation  (Non-Medical): No  Physical Activity: Sufficiently Active (01/01/2024)   Exercise Vital Sign    Days of Exercise per Week: 5 days    Minutes of Exercise per Session: 40 min  Stress: No Stress Concern Present (01/01/2024)   [employer] of Occupational Health - Occupational Stress Questionnaire    Feeling of Stress : Not at all  Social Connections: Moderately Integrated (01/01/2024)   Social Connection and Isolation Panel [NHANES]    Frequency of Communication with Friends and Family: More than three times a week    Frequency of Social Gatherings with Friends and Family: More than three times a week    Attends Religious Services: More than 4 times per year    Active Member of [employer] or Organizations: Yes    Attends Banker Meetings: More than 4 times per year    Marital Status: Widowed  Intimate Partner Violence: Not At Risk (12/24/2022)   Humiliation, Afraid, Rape, and Kick questionnaire    Fear of Current or Ex-Partner: No    Emotionally Abused: No    Physically Abused: No    Sexually Abused: No    Family History  Problem Relation Age of Onset   Testicular cancer Brother     Current Outpatient Medications on File Prior to Visit  Medication Sig Dispense Refill   anastrozole (ARIMIDEX) 1 MG tablet Take 1 tablet (1 mg total) by mouth daily. 90 tablet 3   carboxymethylcellulose (REFRESH PLUS) 0.5 % SOLN Place 1 drop into both eyes 3 (three) times daily as needed (dry eyes).     Cholecalciferol (SM VITAMIN D3) 100 MCG (4000 UT) CAPS Take 1 capsule by mouth daily. DV3 + Immune Support     hydrochlorothiazide (HYDRODIURIL) 25 MG tablet TAKE 1 TABLET BY MOUTH DAILY 90 tablet 1   NON FORMULARY Take 1 capsule by mouth daily. Doterra  Food Nutrient Complex     olmesartan (BENICAR) 5 MG tablet TAKE 2 TABLETS BY MOUTH DAILY 180 tablet 1   SYNTHROID 175 MCG tablet TAKE ONE TABLET BY MOUTH EVERY MORNING BEFORE BREAKFAST 90 tablet 1   No current facility-administered medications  on file prior to visit.    Allergies  Allergen Reactions   Penicillins Hives    Childhood Reaction  Received Ancef on 03-20-2022 without issue       Physical Exam Vitals requested from patient and listed below if patient had equipment and was able to obtain at home for this virtual visit: There were no vitals filed for this visit. Estimated body mass index is 35.38 kg/m as calculated from the following:   Height as of 04/04/23: 5\' 4"  (1.626 m).   Weight as of 10/07/23: 206 lb 1.6 oz (93.5 kg).  EKG (optional): deferred due to virtual visit  GENERAL: alert, oriented, no acute distress detected,  full vision exam deferred due to pandemic and/or virtual encounter  PSYCH/NEURO: pleasant and cooperative, no obvious depression or anxiety, speech and thought processing grossly intact, Cognitive function grossly intact  Flowsheet Row Office Visit from 01/02/2024 in [facility] [place]  PHQ-9 Total Score 0           01/02/2024   11:23 AM 08/21/2023    9:36 AM 12/25/2022    7:16 AM 12/24/2022    4:52 PM 04/19/2022    8:39 AM  Depression screen PHQ 2/9  Decreased Interest 0 0 0 0 0  Down, Depressed, Hopeless 0 0 0 0 0  PHQ - 2 Score 0 0 0 0 0  Altered sleeping 0 0 0  0  Tired, decreased energy 0 0 0  0  Change in appetite 0 0 1  0  Feeling bad or failure about yourself  0 0 0  0  Trouble concentrating 0 0 0  0  Moving slowly or fidgety/restless 0 0 0  0  Suicidal thoughts 0 0 0  0  PHQ-9 Score 0 0 1  0  Difficult doing work/chores Not difficult at all Not difficult at all Not difficult at all  Not difficult at all       12/25/2022    7:15 AM 08/01/2023    6:58 PM 08/21/2023    9:36 AM 01/01/2024    8:30 PM 01/02/2024   11:24 AM  Fall Risk  Falls in the past year? 0 0 0 0 0  Was there an injury with Fall? 0  0 0 0  Fall Risk Category Calculator 0  0 0  0  Patient at Risk for Falls Due to No Fall Risks    No Fall Risks  Fall risk Follow up Falls  evaluation completed  Falls evaluation completed  Falls evaluation completed     Patient-reported     SUMMARY AND PLAN:  Encounter for Medicare annual wellness exam   Discussed applicable health maintenance/preventive health measures and advised and referred or ordered per patient preferences: -has refused vaccines and colonoscopy in the past - but had cologuard - updated chart -declined the vaccines Health Maintenance  Topic Date Due   COVID-19 Vaccine (1) 01/18/2024 (Originally 04/02/1956)   Zoster Vaccines- Shingrix (1 of 2) 03/31/2024 (Originally 04[birth year]03/1970)   DTaP/Tdap/Td (2 - Td or Tdap) 01/01/2025 (Originally 11/05/2022)   INFLUENZA VACCINE  08/03/2025 (Originally 06/06/2023)   MAMMOGRAM  04/23/2024   DEXA SCAN  12/13/2024   Medicare Annual Wellness (AWV)  01/01/2025   Fecal DNA (Cologuard)  01/08/2026   Pneumonia Vaccine 9+ Years old  Completed   Hepatitis C Screening  Completed   HPV VACCINES  Aged Out   Colonoscopy  Discontinued      Education and counseling on the following was provided based on the above review of health and a plan/checklist for the patient, along with additional information discussed, was provided for the patient in the patient instructions :  -Advised and counseled on a healthy lifestyle - including the importance of a healthy diet, regular physical activity, social connections  -Reviewed patient's current diet. Advised and counseled on a whole foods based healthy diet. A summary of a healthy diet was provided in the Patient Instructions. Discussed lowering processed foods/carbs and sweets and sweetened veggies.  -reviewed patient's current physical activity level and discussed exercise guidelines for adults. Discussed community resources and ideas for safe exercise at home to assist in meeting exercise guideline recommendations  in a safe and healthy way. Congratulated on the variety and amount of exercise and encouraged to continue.  -Advise yearly  dental visits at minimum and regular eye exams   Follow up: see patient instructions     Patient Instructions  I really enjoyed getting to talk with you today! I am available on Tuesdays and Thursdays for virtual visits if you have any questions or concerns, or if I can be of any further assistance.   CHECKLIST FROM ANNUAL WELLNESS VISIT:  -Follow up (please call to schedule if not scheduled after visit):   -yearly for annual wellness visit with primary care office  Here is a list of your preventive care/health maintenance measures and the plan for each if any are due:  PLAN For any measures below that may be due:   Health Maintenance  Topic Date Due   COVID-19 Vaccine (1) 01/18/2024 (Originally 04/02/1956)   Zoster Vaccines- Shingrix (1 of 2) 03/31/2024 (Originally 04/03/1970)   DTaP/Tdap/Td (2 - Td or Tdap) 01/01/2025 (Originally 11/05/2022)   INFLUENZA VACCINE  08/03/2025 (Originally 06/06/2023)   MAMMOGRAM  04/23/2024   DEXA SCAN  12/13/2024   Medicare Annual Wellness (AWV)  01/01/2025   Fecal DNA (Cologuard)  01/08/2026   Pneumonia Vaccine 64+ Years old  Completed   Hepatitis C Screening  Completed   HPV VACCINES  Aged Out   Colonoscopy  Discontinued    -See a dentist at least yearly  -Get your eyes checked and then per your eye specialist's recommendations  -Other issues addressed today:   -I have included below further information regarding a healthy whole foods based diet, physical activity guidelines for adults, stress management and opportunities for social connections. I hope you find this information useful.   -----------------------------------------------------------------------------------------------------------------------------------------------------------------------------------------------------------------------------------------------------------    NUTRITION: -eat real food: lots of colorful vegetables (half the plate) and fruits -5-7 servings of  vegetables and fruits per day (fresh or steamed is best), exp. 2 servings of vegetables with lunch and dinner and 2 servings of fruit per day. Berries and greens such as kale and collards are great choices.  -consume on a regular basis:  fresh fruits, fresh veggies, fish, nuts, seeds, healthy oils (such as olive oil, avocado oil), whole grains (make sure for bread/pasta/crackers/etc., that the first ingredient on label contains the word "whole"), legumes. -can eat small amounts of dairy and lean meat (no larger than the palm of your hand), but avoid processed meats such as ham, bacon, lunch meat, etc. -drink water -try to avoid fast food and pre-packaged foods, processed meat, ultra processed foods/beverages (donuts, candy, etc.) -most experts advise limiting sodium to < 2300mg  per day, should limit further is any chronic conditions such as high blood pressure, heart disease, diabetes, etc. The American Heart Association advised that < 1500mg  is is ideal -try to avoid foods/beverages that contain any ingredients with names you do not recognize  -try to avoid foods/beverages  with added sugar or sweeteners/sweets  -try to avoid sweet drinks (including diet drinks): soda, juice, Gatorade, sweet tea, power drinks, diet drinks -try to avoid white rice, white bread, pasta (unless whole grain)  EXERCISE GUIDELINES FOR ADULTS: -if you wish to increase your physical activity, do so gradually and with the approval of your doctor -STOP and seek medical care immediately if you have any chest pain, chest discomfort or trouble breathing when starting or increasing exercise  -move and stretch your body, legs, feet and arms when sitting for long periods -Physical activity guidelines for optimal  health in adults: -get at least 150 minutes per week of moderate exercise (can talk, but not sing); this is about 20-30 minutes of sustained activity 5-7 days per week or two 10-15 minute episodes of sustained activity 5-7  days per week -do some muscle building/resistance training/strength training at least 2 days per week  -balance exercises 3+ days per week:   Stand somewhere where you have something sturdy to hold onto if you lose balance    1) lift up on toes, then back down, start with 5x per day and work up to 20x   2) stand and lift one leg straight out to the side so that foot is a few inches of the floor, start with 5x each side and work up to 20x each side   3) stand on one foot, start with 5 seconds each side and work up to 20 seconds on each side  If you need ideas or help with getting more active:  -Silver sneakers https://tools.silversneakers.com  -Walk with a Doc: http://www.duncan-williams.com/  -try to include resistance (weight lifting/strength building) and balance exercises twice per week: or the following link for ideas: http://castillo-powell.com/  BuyDucts.dk  STRESS MANAGEMENT: -can try meditating, or just sitting quietly with deep breathing while intentionally relaxing all parts of your body for 5 minutes daily -if you need further help with stress, anxiety or depression please follow up with your primary doctor or contact the wonderful folks at [facility]: [phone]  SOCIAL CONNECTIONS: -options in [place] if you wish to engage in more social and exercise related activities:  -Silver sneakers https://tools.silversneakers.com  -Walk with a Doc: http://www.duncan-williams.com/  -Check out the [facility] Active Adults 50+ section on the [place] [employer] (hiking clubs, book clubs, cards and games, chess, exercise classes, aquatic classes and much more) - see the website for details: https://www.[place].gov/departments/parks-recreation/active-adults50  -YouTube has lots of exercise videos for different ages and abilities as well  -[facility] (a  variety of indoor and outdoor inperson activities for adults). [phone]. [address]  -Virtual Online Classes (a variety of topics): see seniorplanet.org or call 910-192-4712  -consider volunteering at a school, hospice center, church, senior center or elsewhere            Terressa Koyanagi, DO

## 2024-01-02 NOTE — Patient Instructions (Signed)
 I really enjoyed getting to talk with you today! I am available on Tuesdays and Thursdays for virtual visits if you have any questions or concerns, or if I can be of any further assistance.   CHECKLIST FROM ANNUAL WELLNESS VISIT:  -Follow up (please call to schedule if not scheduled after visit):   -yearly for annual wellness visit with primary care office  Here is a list of your preventive care/health maintenance measures and the plan for each if any are due:  PLAN For any measures below that may be due:   Health Maintenance  Topic Date Due   COVID-19 Vaccine (1) 01/18/2024 (Originally 04/02/1956)   Zoster Vaccines- Shingrix (1 of 2) 03/31/2024 (Originally 04/03/1970)   DTaP/Tdap/Td (2 - Td or Tdap) 01/01/2025 (Originally 11/05/2022)   INFLUENZA VACCINE  08/03/2025 (Originally 06/06/2023)   MAMMOGRAM  04/23/2024   DEXA SCAN  12/13/2024   Medicare Annual Wellness (AWV)  01/01/2025   Fecal DNA (Cologuard)  01/08/2026   Pneumonia Vaccine 48+ Years old  Completed   Hepatitis C Screening  Completed   HPV VACCINES  Aged Out   Colonoscopy  Discontinued    -See a dentist at least yearly  -Get your eyes checked and then per your eye specialist's recommendations  -Other issues addressed today:   -I have included below further information regarding a healthy whole foods based diet, physical activity guidelines for adults, stress management and opportunities for social connections. I hope you find this information useful.   -----------------------------------------------------------------------------------------------------------------------------------------------------------------------------------------------------------------------------------------------------------    NUTRITION: -eat real food: lots of colorful vegetables (half the plate) and fruits -5-7 servings of vegetables and fruits per day (fresh or steamed is best), exp. 2 servings of vegetables with lunch and dinner and 2  servings of fruit per day. Berries and greens such as kale and collards are great choices.  -consume on a regular basis:  fresh fruits, fresh veggies, fish, nuts, seeds, healthy oils (such as olive oil, avocado oil), whole grains (make sure for bread/pasta/crackers/etc., that the first ingredient on label contains the word "whole"), legumes. -can eat small amounts of dairy and lean meat (no larger than the palm of your hand), but avoid processed meats such as ham, bacon, lunch meat, etc. -drink water -try to avoid fast food and pre-packaged foods, processed meat, ultra processed foods/beverages (donuts, candy, etc.) -most experts advise limiting sodium to < 2300mg  per day, should limit further is any chronic conditions such as high blood pressure, heart disease, diabetes, etc. The American Heart Association advised that < 1500mg  is is ideal -try to avoid foods/beverages that contain any ingredients with names you do not recognize  -try to avoid foods/beverages  with added sugar or sweeteners/sweets  -try to avoid sweet drinks (including diet drinks): soda, juice, Gatorade, sweet tea, power drinks, diet drinks -try to avoid white rice, white bread, pasta (unless whole grain)  EXERCISE GUIDELINES FOR ADULTS: -if you wish to increase your physical activity, do so gradually and with the approval of your doctor -STOP and seek medical care immediately if you have any chest pain, chest discomfort or trouble breathing when starting or increasing exercise  -move and stretch your body, legs, feet and arms when sitting for long periods -Physical activity guidelines for optimal health in adults: -get at least 150 minutes per week of moderate exercise (can talk, but not sing); this is about 20-30 minutes of sustained activity 5-7 days per week or two 10-15 minute episodes of sustained activity 5-7 days per week -do some  muscle building/resistance training/strength training at least 2 days per week  -balance  exercises 3+ days per week:   Stand somewhere where you have something sturdy to hold onto if you lose balance    1) lift up on toes, then back down, start with 5x per day and work up to 20x   2) stand and lift one leg straight out to the side so that foot is a few inches of the floor, start with 5x each side and work up to 20x each side   3) stand on one foot, start with 5 seconds each side and work up to 20 seconds on each side  If you need ideas or help with getting more active:  -Silver sneakers https://tools.silversneakers.com  -Walk with a Doc: http://www.duncan-williams.com/  -try to include resistance (weight lifting/strength building) and balance exercises twice per week: or the following link for ideas: http://castillo-powell.com/  BuyDucts.dk  STRESS MANAGEMENT: -can try meditating, or just sitting quietly with deep breathing while intentionally relaxing all parts of your body for 5 minutes daily -if you need further help with stress, anxiety or depression please follow up with your primary doctor or contact the wonderful folks at [facility]: [phone]  SOCIAL CONNECTIONS: -options in [place] if you wish to engage in more social and exercise related activities:  -Silver sneakers https://tools.silversneakers.com  -Walk with a Doc: http://www.duncan-williams.com/  -Check out the [facility] Active Adults 50+ section on the [place] [employer] (hiking clubs, book clubs, cards and games, chess, exercise classes, aquatic classes and much more) - see the website for details: https://www.[place].gov/departments/parks-recreation/active-adults50  -YouTube has lots of exercise videos for different ages and abilities as well  -[facility] (a variety of indoor and outdoor inperson activities for adults). [phone]. [address]  -Virtual Online  Classes (a variety of topics): see seniorplanet.org or call [phone]  -consider volunteering at a school, hospice center, church, senior center or elsewhere

## 2024-01-02 NOTE — Progress Notes (Signed)
 Patient unable to obtain vital signs due to telehealth visit

## 2024-02-02 ENCOUNTER — Other Ambulatory Visit: Payer: Self-pay | Admitting: Internal Medicine

## 2024-02-02 DIAGNOSIS — E038 Other specified hypothyroidism: Secondary | ICD-10-CM

## 2024-02-02 DIAGNOSIS — I10 Essential (primary) hypertension: Secondary | ICD-10-CM

## 2024-02-16 IMAGING — MG MM PLC BREAST LOC DEV 1ST LESION INC MAMMO GUIDE*L*
8 of 9 series · 8 of 9 positions shown · non-contrast
Comparison: Prior studies

CLINICAL DATA: Patient presents for seed localization prior to LEFT
lumpectomy.

EXAM:
MAMMOGRAPHIC GUIDED RADIOACTIVE SEED LOCALIZATION OF THE LEFT BREAST

[L CC (1 of 5)]
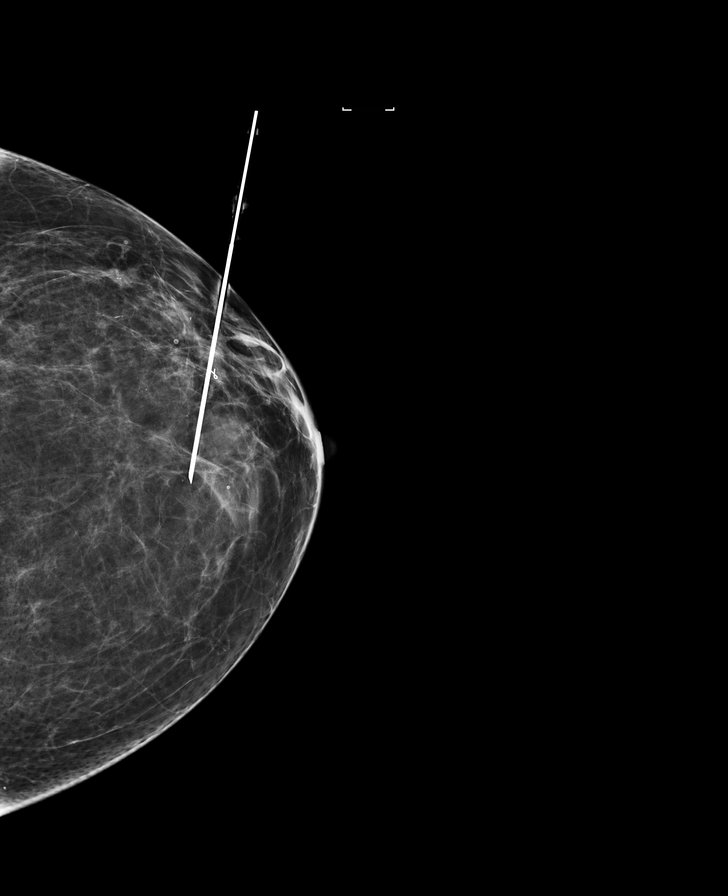

[L CC (2 of 5)]
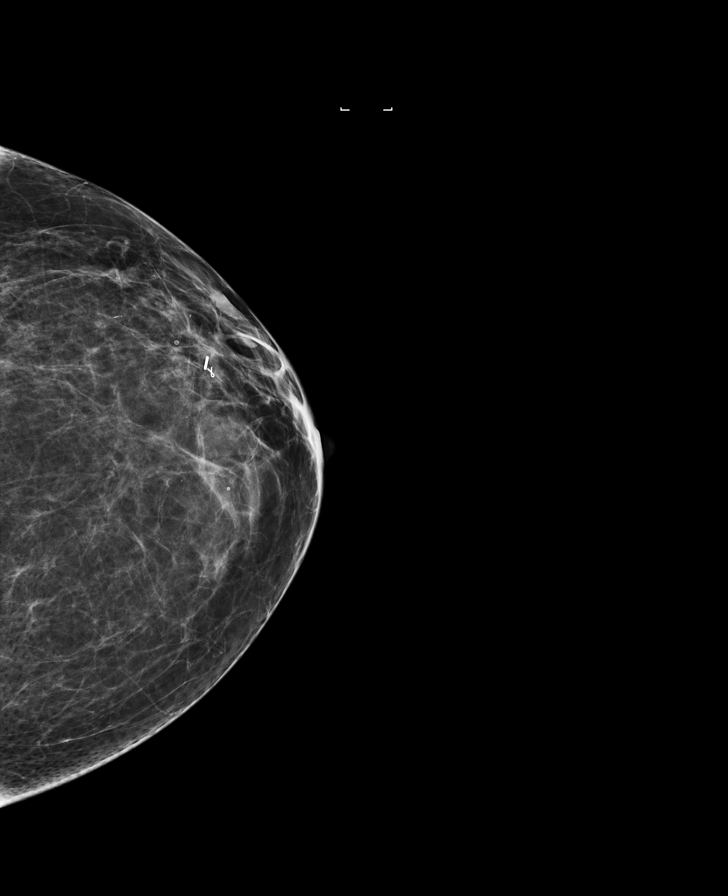

[L CC (3 of 5)]
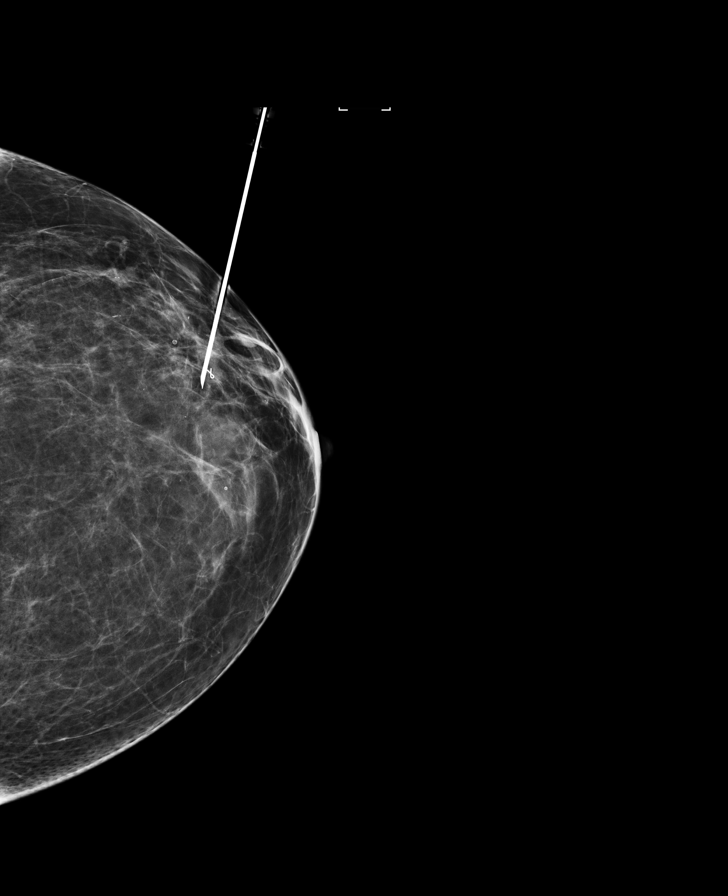

[L LM (1 of 3)]
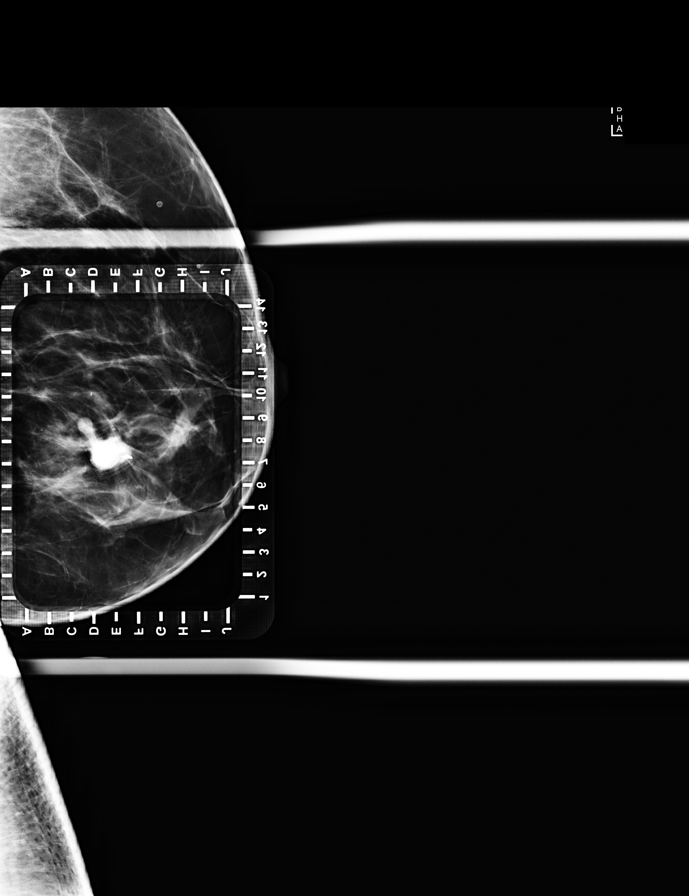

[L LM (2 of 3)]
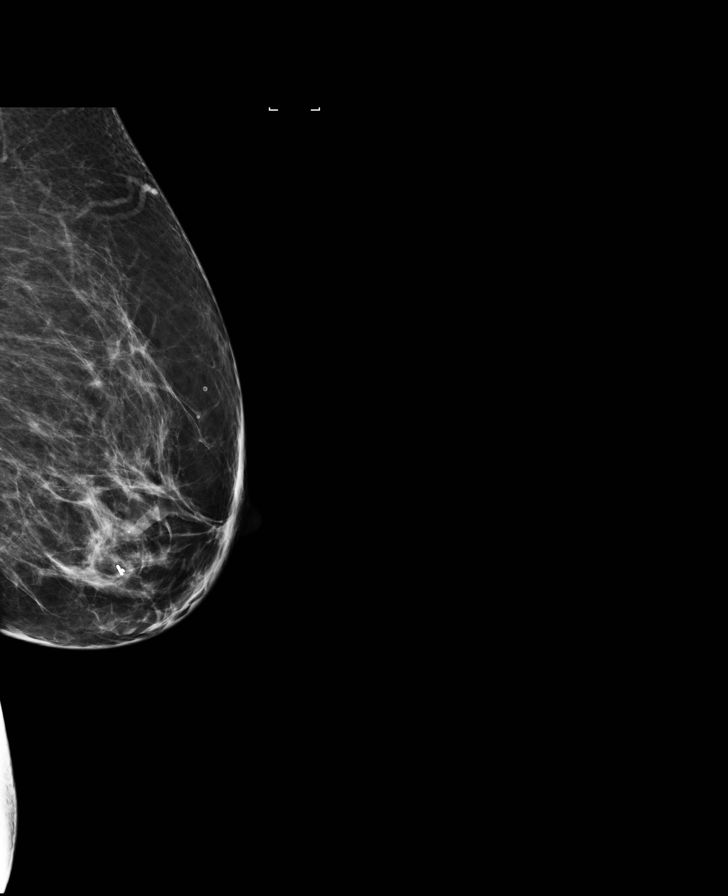

[L CC (4 of 5)]
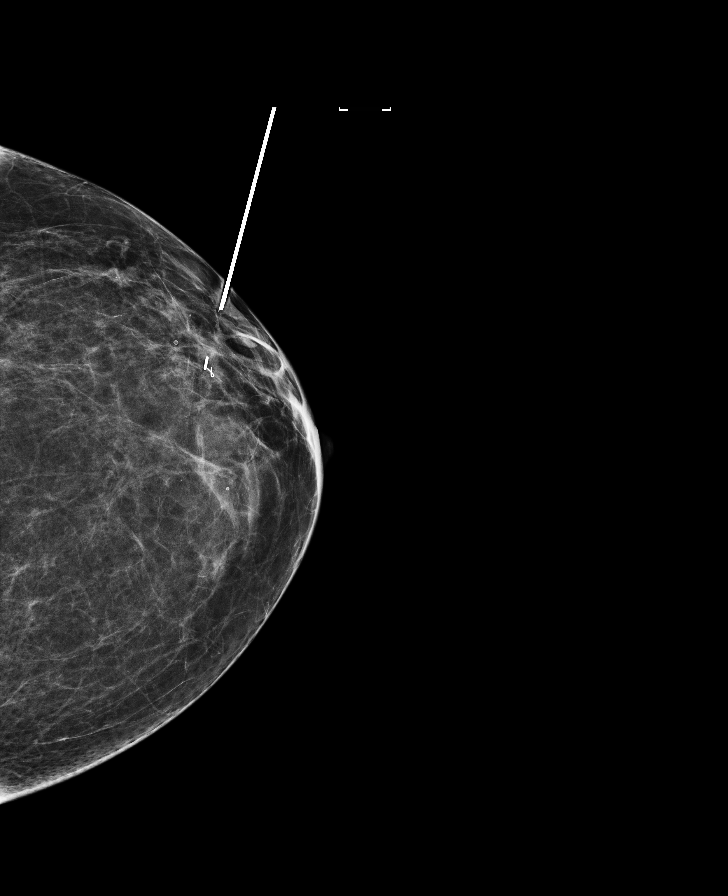

[L LM (3 of 3)]
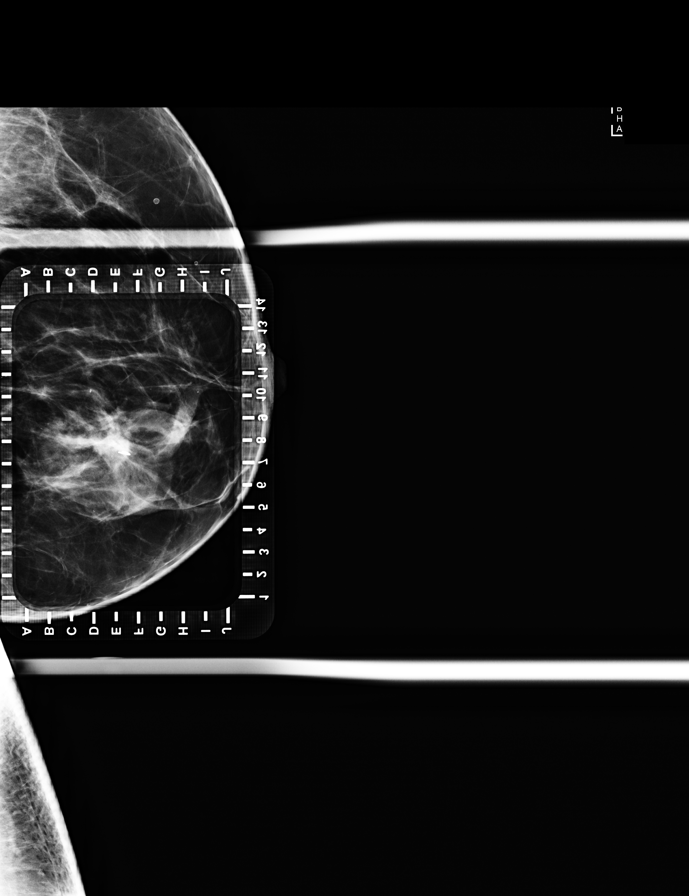

[L CC (5 of 5)]
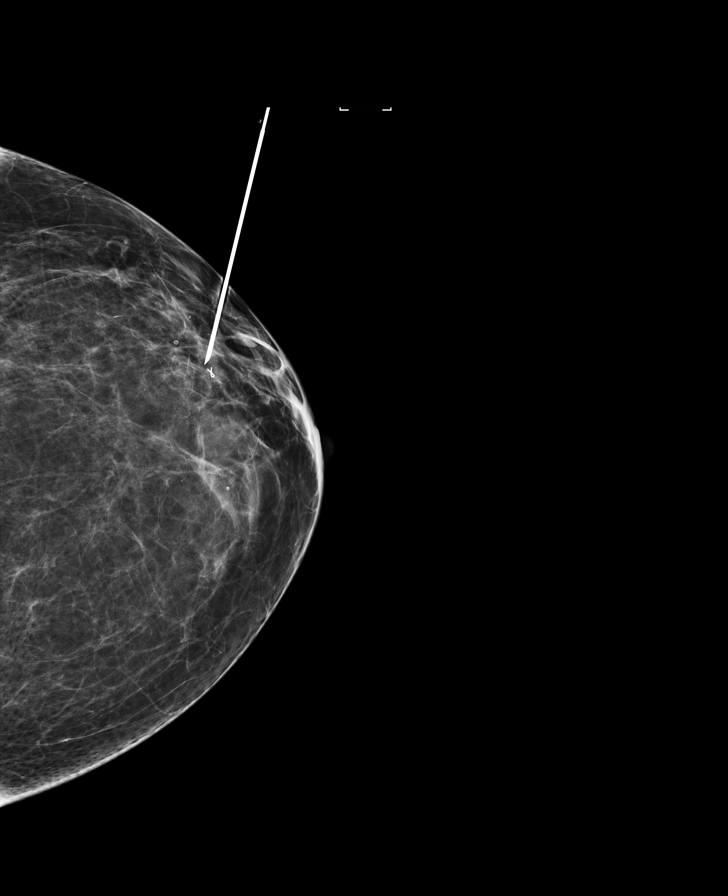

[8 of 9 positions shown; findings below may reference images not displayed]

FINDINGS: Patient presents for radioactive seed localization prior to
lumpectomy. I met with the patient and we discussed the procedure of
seed localization including benefits and alternatives. We discussed
the high likelihood of a successful procedure. We discussed the
risks of the procedure including infection, bleeding, tissue injury
and further surgery. We discussed the low dose of radioactivity
involved in the procedure. Informed, written consent was given.

The usual time-out protocol was performed immediately prior to the
procedure.

Using mammographic guidance, sterile technique, 1% lidocaine and an
U-HR2 radioactive seed, the ribbon shaped clip in the LOWER OUTER
QUADRANT of the LEFT breast was localized using a LATERAL approach.
The follow-up mammogram images confirm the seed in the expected
location and were marked for Dr. Yudanova.

Follow-up survey of the patient confirms presence of the radioactive
seed.

Order number of U-HR2 seed:  [id number].

Total activity:  0.251 millicuries reference Date: 02/25/2022

The patient tolerated the procedure well and was released from the
[REDACTED]. She was given instructions regarding seed removal.
IMPRESSION: Radioactive seed localization left breast. No apparent
complications.

## 2024-02-17 IMAGING — MG MM BREAST SURGICAL SPECIMEN
1 series · 1 of 1 positions shown · non-contrast
Comparison: Previous exams.

CLINICAL DATA: Status post lumpectomy today after earlier
radioactive seed localization.

EXAM:
SPECIMEN RADIOGRAPH OF THE LEFT BREAST

[L]
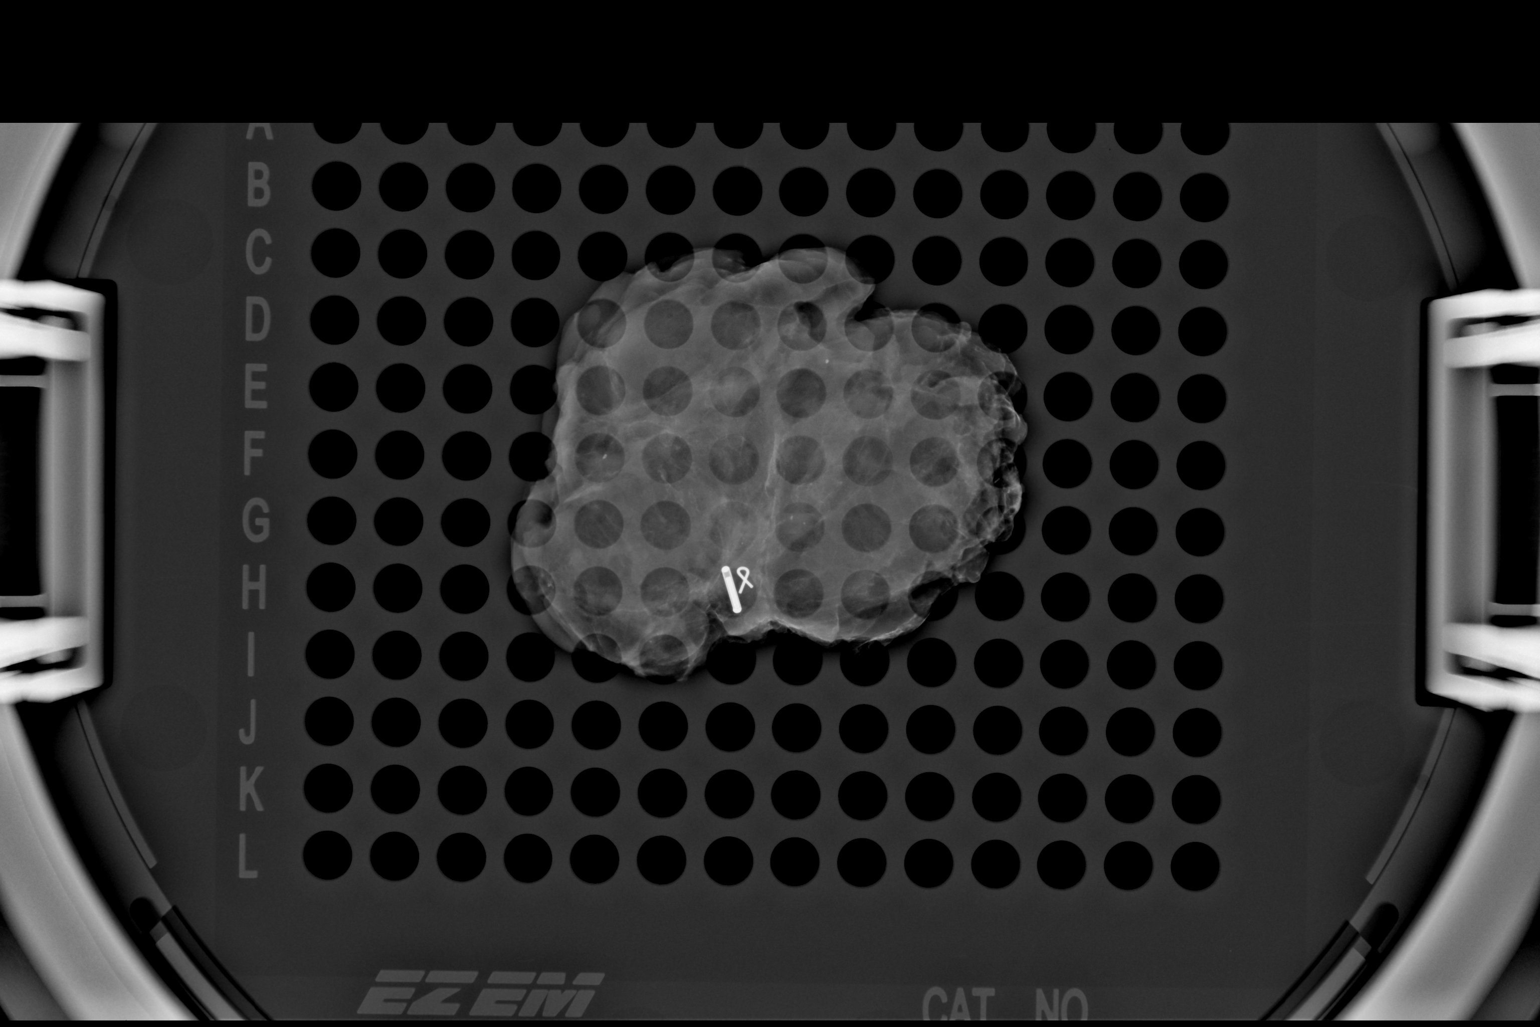

[1 of 1 positions shown; findings below may reference images not displayed]

FINDINGS: Status post excision of the left breast. The radioactive seed and
biopsy marker clip are present, completely intact, and were marked
for pathology. Findings discussed with the OR staff during the
procedure.
IMPRESSION: Specimen radiograph of the left breast.

## 2024-02-28 DIAGNOSIS — H43813 Vitreous degeneration, bilateral: Secondary | ICD-10-CM | POA: Diagnosis not present

## 2024-02-28 DIAGNOSIS — H524 Presbyopia: Secondary | ICD-10-CM | POA: Diagnosis not present

## 2024-03-04 ENCOUNTER — Encounter: Payer: Self-pay | Admitting: Internal Medicine

## 2024-03-04 ENCOUNTER — Ambulatory Visit (INDEPENDENT_AMBULATORY_CARE_PROVIDER_SITE_OTHER): Payer: Medicare PPO | Admitting: Internal Medicine

## 2024-03-04 VITALS — BP 124/70 | HR 73 | Temp 97.9°F | Ht 63.5 in | Wt 215.4 lb

## 2024-03-04 DIAGNOSIS — Z0000 Encounter for general adult medical examination without abnormal findings: Secondary | ICD-10-CM

## 2024-03-04 DIAGNOSIS — R7302 Impaired glucose tolerance (oral): Secondary | ICD-10-CM

## 2024-03-04 DIAGNOSIS — E7800 Pure hypercholesterolemia, unspecified: Secondary | ICD-10-CM | POA: Diagnosis not present

## 2024-03-04 DIAGNOSIS — Z6837 Body mass index (BMI) 37.0-37.9, adult: Secondary | ICD-10-CM | POA: Diagnosis not present

## 2024-03-04 DIAGNOSIS — C50512 Malignant neoplasm of lower-outer quadrant of left female breast: Secondary | ICD-10-CM

## 2024-03-04 DIAGNOSIS — Z170 Estrogen receptor positive status [ER+]: Secondary | ICD-10-CM

## 2024-03-04 DIAGNOSIS — I10 Essential (primary) hypertension: Secondary | ICD-10-CM | POA: Diagnosis not present

## 2024-03-04 DIAGNOSIS — E038 Other specified hypothyroidism: Secondary | ICD-10-CM

## 2024-03-04 LAB — LIPID PANEL
Cholesterol: 176 mg/dL (ref 0–200)
HDL: 50.6 mg/dL (ref >39.00)
LDL Cholesterol: 104 mg/dL — ABNORMAL HIGH (ref 0–99)
NonHDL: 125.75
Total CHOL/HDL Ratio: 3
Triglycerides: 111 mg/dL (ref 0.0–149.0)
VLDL: 22.2 mg/dL (ref 0.0–40.0)

## 2024-03-04 LAB — COMPREHENSIVE METABOLIC PANEL WITH GFR
ALT: 22 U/L (ref 0–35)
AST: 25 U/L (ref 0–37)
Albumin: 4.3 g/dL (ref 3.5–5.2)
Alkaline Phosphatase: 86 U/L (ref 39–117)
BUN: 31 mg/dL — ABNORMAL HIGH (ref 6–23)
CO2: 26 meq/L (ref 19–32)
Calcium: 9.9 mg/dL (ref 8.4–10.5)
Chloride: 100 meq/L (ref 96–112)
Creatinine, Ser: 0.87 mg/dL (ref 0.40–1.20)
GFR: 66.8 mL/min (ref >60.00)
Glucose, Bld: 129 mg/dL — ABNORMAL HIGH (ref 70–99)
Potassium: 3.4 meq/L — ABNORMAL LOW (ref 3.5–5.1)
Sodium: 136 meq/L (ref 135–145)
Total Bilirubin: 0.8 mg/dL (ref 0.2–1.2)
Total Protein: 7.2 g/dL (ref 6.0–8.3)

## 2024-03-04 LAB — CBC WITH DIFFERENTIAL/PLATELET
Basophils Absolute: 0.1 10*3/uL (ref 0.0–0.1)
Basophils Relative: 0.9 % (ref 0.0–3.0)
Eosinophils Absolute: 0.1 10*3/uL (ref 0.0–0.7)
Eosinophils Relative: 1.9 % (ref 0.0–5.0)
HCT: 39.1 % (ref 36.0–46.0)
Hemoglobin: 13.3 g/dL (ref 12.0–15.0)
Lymphocytes Relative: 25.1 % (ref 12.0–46.0)
Lymphs Abs: 2 10*3/uL (ref 0.7–4.0)
MCHC: 34.1 g/dL (ref 30.0–36.0)
MCV: 84.2 fl (ref 78.0–100.0)
Monocytes Absolute: 0.5 10*3/uL (ref 0.1–1.0)
Monocytes Relative: 5.9 % (ref 3.0–12.0)
Neutro Abs: 5.2 10*3/uL (ref 1.4–7.7)
Neutrophils Relative %: 66.2 % (ref 43.0–77.0)
Platelets: 295 10*3/uL (ref 150.0–400.0)
RBC: 4.64 Mil/uL (ref 3.87–5.11)
RDW: 13.6 % (ref 11.5–15.5)
WBC: 7.9 10*3/uL (ref 4.0–10.5)

## 2024-03-04 LAB — HEMOGLOBIN A1C: Hgb A1c MFr Bld: 6 % (ref 4.6–6.5)

## 2024-03-04 LAB — TSH: TSH: 0.67 u[IU]/mL (ref 0.35–5.50)

## 2024-03-04 NOTE — Progress Notes (Signed)
 Established Patient Office Visit     CC/Reason for Visit: Annual preventive exam  HPI: Karina Allen is a 73 y.o. female who is coming in today for the above mentioned reasons. Past Medical History is significant for: Hypertension, hyperlipidemia, impaired glucose tolerance, morbid obesity, left DCIS.  She is feeling well without concerns or complaints.  She is due for Tdap and shingles vaccinations.  Cancer screening is up-to-date.   Past Medical/Surgical History: Past Medical History:  Diagnosis Date   Allergy 1955   Penicillin   Arthritis    osteo   Breast cancer (HCC) 03/20/2022   Cataract 2010   Cataracts removed   Depression    Epiretinal membrane 02/05/2017   Sees Dr. Ambrosio Junker and Dr. Lenon Radar   Essential hypertension 04/27/2014   FH: migraines    GAD (generalized anxiety disorder) 08/04/2015   Hepatitis B    reports dx and tx as a young child, reports no issues since and retested as adult and told did not have hepatitis, she is unsure of type of hep she had   Personal history of radiation therapy    Thyroid  disease     Past Surgical History:  Procedure Laterality Date   APPENDECTOMY     BREAST LUMPECTOMY WITH RADIOACTIVE SEED LOCALIZATION Left 03/20/2022   Procedure: LEFT BREAST LUMPECTOMY WITH RADIOACTIVE SEED LOCALIZATION;  Surgeon: Dareen Ebbing, MD;  Location: [facility] OR;  Service: General;  Laterality: Left;   BREAST SURGERY  2023   CATARACT EXTRACTION     CHOLECYSTECTOMY     ELBOW SURGERY     shattered elbow   endometrial oblation     EYE SURGERY  2012   Detached Retina   FOOT SURGERY     FRACTURE SURGERY  2001   Elbow   JOINT REPLACEMENT  Sep 20, 2021   Reverse Shoulder Left   RETINAL DETACHMENT SURGERY     REVERSE SHOULDER ARTHROPLASTY Left 09/20/2021   Procedure: LEFT REVERSE SHOULDER ARTHROPLASTY;  Surgeon: Micheline Ahr, MD;  Location: [facility];  Service: Orthopedics;  Laterality: Left;   TONSILLECTOMY AND ADENOIDECTOMY      TUBAL LIGATION      Social History:  reports that she has never smoked. She has never used smokeless tobacco. She reports that she does not currently use alcohol. She reports that she does not use drugs.  Allergies: Allergies  Allergen Reactions   Penicillins Hives    Childhood Reaction  Received Ancef  on 03-20-2022 without issue    Family History:  Family History  Problem Relation Age of Onset   Testicular cancer Brother    Heart disease Mother    Vision loss Mother    Alcohol abuse Father      Current Outpatient Medications:    anastrozole  (ARIMIDEX ) 1 MG tablet, Take 1 tablet (1 mg total) by mouth daily., Disp: 90 tablet, Rfl: 3   carboxymethylcellulose (REFRESH PLUS) 0.5 % SOLN, Place 1 drop into both eyes 3 (three) times daily as needed (dry eyes)., Disp: , Rfl:    Cholecalciferol (SM VITAMIN D3) 100 MCG (4000 UT) CAPS, Take 1 capsule by mouth daily. DV3 + Immune Support, Disp: , Rfl:    hydrochlorothiazide  (HYDRODIURIL ) 25 MG tablet, TAKE 1 TABLET BY MOUTH DAILY, Disp: 90 tablet, Rfl: 0   levothyroxine  (SYNTHROID ) 175 MCG tablet, TAKE ONE TABLET BY MOUTH EVERY MORNING BEFORE BREAKFAST, Disp: 90 tablet, Rfl: 0   NON FORMULARY, Take 1 capsule by mouth daily. [employer]  Food  Nutrient Complex, Disp: , Rfl:    olmesartan  (BENICAR ) 5 MG tablet, TAKE 2 TABLETS BY MOUTH DAILY, Disp: 180 tablet, Rfl: 0  Review of Systems:  Negative unless indicated in HPI.   Physical Exam: Vitals:   03/04/24 0757  BP: 124/70  Pulse: 73  Temp: 97.9 F (36.6 C)  TempSrc: Oral  SpO2: 98%  Weight: 215 lb 6.4 oz (97.7 kg)  Height: 5' 3.5" (1.613 m)    Body mass index is 37.56 kg/m.   Physical Exam Vitals reviewed.  Constitutional:      General: She is not in acute distress.    Appearance: Normal appearance. She is obese. She is not ill-appearing, toxic-appearing or diaphoretic.  HENT:     Head: Normocephalic.     Right Ear: Tympanic membrane, ear canal and external ear normal. There  is no impacted cerumen.     Left Ear: Tympanic membrane, ear canal and external ear normal. There is no impacted cerumen.     Nose: Nose normal.     Mouth/Throat:     Mouth: Mucous membranes are moist.     Pharynx: Oropharynx is clear. No oropharyngeal exudate or posterior oropharyngeal erythema.  Eyes:     General: No scleral icterus.       Right eye: No discharge.        Left eye: No discharge.     Conjunctiva/sclera: Conjunctivae normal.     Pupils: Pupils are equal, round, and reactive to light.  Neck:     Vascular: No carotid bruit.  Cardiovascular:     Rate and Rhythm: Normal rate and regular rhythm.     Pulses: Normal pulses.     Heart sounds: Normal heart sounds.  Pulmonary:     Effort: Pulmonary effort is normal. No respiratory distress.     Breath sounds: Normal breath sounds.  Abdominal:     General: Abdomen is flat. Bowel sounds are normal.     Palpations: Abdomen is soft.  Musculoskeletal:        General: Normal range of motion.     Cervical back: Normal range of motion.  Skin:    General: Skin is warm and dry.  Neurological:     General: No focal deficit present.     Mental Status: She is alert and oriented to person, place, and time. Mental status is at baseline.  Psychiatric:        Mood and Affect: Mood normal.        Behavior: Behavior normal.        Thought Content: Thought content normal.        Judgment: Judgment normal.     Flowsheet Row Office Visit from 01/02/2024 in [facility] [place]  PHQ-9 Total Score 0        Impression and Plan:  Encounter for preventive health examination  Essential hypertension -     CBC with Differential/Platelet; Future -     Comprehensive metabolic panel with GFR; Future  Other specified hypothyroidism -     TSH; Future  Pure hypercholesterolemia -     Lipid panel; Future  Morbid obesity (HCC)  IGT (impaired glucose tolerance) -     Hemoglobin A1c; Future  Malignant neoplasm of  lower-outer quadrant of left breast of female, estrogen receptor positive (HCC)   -Recommend routine eye and dental care. -Healthy lifestyle discussed in detail. -Labs to be updated today. -Prostate cancer screening: N/A Health Maintenance  Topic Date Due   COVID-19 Vaccine (  1) Never done   Zoster (Shingles) Vaccine (1 of 2) 03/31/2024*   DTaP/Tdap/Td vaccine (2 - Td or Tdap) 01/01/2025*   Flu Shot  08/03/2025*   Mammogram  04/23/2024   DEXA scan (bone density measurement)  12/13/2024   Medicare Annual Wellness Visit  01/01/2025   Cologuard (Stool DNA test)  01/08/2026   Pneumonia Vaccine  Completed   Hepatitis C Screening  Completed   HPV Vaccine  Aged Out   Meningitis B Vaccine  Aged Out   Colon Cancer Screening  Discontinued  *Topic was postponed. The date shown is not the original due date.     - Advised to update Tdap and shingles vaccine. - Mammogram is already scheduled. - Cologuard is up-to-date. - DEXA is up-to-date.    Marguerita Shih, MD [facility]

## 2024-03-05 ENCOUNTER — Encounter: Payer: Self-pay | Admitting: Internal Medicine

## 2024-03-05 ENCOUNTER — Other Ambulatory Visit: Payer: Self-pay | Admitting: Internal Medicine

## 2024-03-05 DIAGNOSIS — E876 Hypokalemia: Secondary | ICD-10-CM

## 2024-03-05 MED ORDER — POTASSIUM CHLORIDE CRYS ER 20 MEQ PO TBCR: 20.0000 meq | EXTENDED_RELEASE_TABLET | Freq: Every day | ORAL | 0 refills | Status: DC

## 2024-03-06 ENCOUNTER — Other Ambulatory Visit: Payer: Self-pay | Admitting: Internal Medicine

## 2024-03-06 DIAGNOSIS — E876 Hypokalemia: Secondary | ICD-10-CM

## 2024-03-24 ENCOUNTER — Other Ambulatory Visit: Payer: Self-pay | Admitting: Internal Medicine

## 2024-03-24 DIAGNOSIS — E876 Hypokalemia: Secondary | ICD-10-CM

## 2024-04-06 ENCOUNTER — Ambulatory Visit: Payer: Medicare PPO | Admitting: Hematology and Oncology

## 2024-05-01 ENCOUNTER — Other Ambulatory Visit: Payer: Self-pay | Admitting: Internal Medicine

## 2024-05-01 DIAGNOSIS — I10 Essential (primary) hypertension: Secondary | ICD-10-CM

## 2024-05-01 DIAGNOSIS — E038 Other specified hypothyroidism: Secondary | ICD-10-CM

## 2024-05-06 ENCOUNTER — Telehealth: Payer: Self-pay

## 2024-05-06 NOTE — Telephone Encounter (Signed)
 While doing chart review for pts coming in next day, pt is scheduled for her MM 05/14/24. MD prefers to have MM results before seeing for f/u. Pt was agreeable to be r/s 7/30 at 0815. Appt rescheduled.

## 2024-05-06 NOTE — Telephone Encounter (Signed)
 Addendum: Pt's appt is 7/30 at [facility]

## 2024-05-07 ENCOUNTER — Inpatient Hospital Stay: Admitting: Hematology and Oncology

## 2024-05-14 ENCOUNTER — Ambulatory Visit
Admission: RE | Admit: 2024-05-14 | Discharge: 2024-05-14 | Disposition: A | Discharge status: Home / Self Care | Source: Ambulatory Visit | Attending: Hematology and Oncology | Admitting: Hematology and Oncology

## 2024-05-14 DIAGNOSIS — Z86000 Personal history of in-situ neoplasm of breast: Secondary | ICD-10-CM | POA: Diagnosis not present

## 2024-05-14 DIAGNOSIS — Z09 Encounter for follow-up examination after completed treatment for conditions other than malignant neoplasm: Secondary | ICD-10-CM | POA: Diagnosis not present

## 2024-05-14 DIAGNOSIS — C50512 Malignant neoplasm of lower-outer quadrant of left female breast: Secondary | ICD-10-CM

## 2024-06-02 ENCOUNTER — Telehealth: Payer: Self-pay

## 2024-06-02 NOTE — Telephone Encounter (Signed)
 Pt verbally confirmed appt for 7/30

## 2024-06-03 ENCOUNTER — Inpatient Hospital Stay: Attending: Hematology and Oncology | Admitting: Hematology and Oncology

## 2024-06-03 VITALS — BP 125/52 | Temp 98.7°F | Resp 14 | Wt 223.9 lb

## 2024-06-03 DIAGNOSIS — Z79811 Long term (current) use of aromatase inhibitors: Secondary | ICD-10-CM | POA: Diagnosis not present

## 2024-06-03 DIAGNOSIS — M8580 Other specified disorders of bone density and structure, unspecified site: Secondary | ICD-10-CM | POA: Insufficient documentation

## 2024-06-03 DIAGNOSIS — Z1721 Progesterone receptor positive status: Secondary | ICD-10-CM | POA: Insufficient documentation

## 2024-06-03 DIAGNOSIS — Z170 Estrogen receptor positive status [ER+]: Secondary | ICD-10-CM | POA: Diagnosis not present

## 2024-06-03 DIAGNOSIS — Z79899 Other long term (current) drug therapy: Secondary | ICD-10-CM | POA: Insufficient documentation

## 2024-06-03 DIAGNOSIS — Z7182 Exercise counseling: Secondary | ICD-10-CM | POA: Diagnosis not present

## 2024-06-03 DIAGNOSIS — C50512 Malignant neoplasm of lower-outer quadrant of left female breast: Secondary | ICD-10-CM | POA: Diagnosis not present

## 2024-06-03 NOTE — Progress Notes (Signed)
 BRIEF ONCOLOGIC HISTORY:  Oncology History  Malignant neoplasm of lower-outer quadrant of left breast of female, estrogen receptor positive (HCC)  02/07/2022 Mammogram   Screening mammogram from February 07, 2022 showed calcifications in the left breast.  Diagnostic mammogram confirmed a group of indeterminate microcalcifications in the lateral lower left breast measuring 1 x 0.4 x 8.8 cm.    03/05/2022 Pathology Results   Pathology from the left breast showed DCIS with predominantly cribriform growth pattern and scattered foci of intraluminal microcalcification, grade 2, ER 100% positive strong staining, PR 70% positive moderate staining and Ki-67 of 2%   03/12/2022 Initial Diagnosis   Malignant neoplasm of lower-outer quadrant of left breast of female, estrogen receptor positive (HCC).    03/20/2022 Surgery   She had left breast lumpectomy which showed focus of residual ductal carcinoma in situ with calcifications, intermediate grade, 2.5 mm.  DCIS is 0.1 cm from the inked medial margin   03/20/2022 Cancer Staging   Staging form: Breast, AJCC 8th Edition - Pathologic stage from 03/20/2022: Stage 0 (pTis (DCIS), pN0, cM0, ER+, PR+) - Signed by Crawford Morna Pickle, NP on 09/13/2022   05/03/2022 - 05/31/2022 Radiation Therapy   Site Technique Total Dose (Gy) Dose per Fx (Gy) Completed Fx Beam Energies  Breast, Left: Breast_L 3D 42.56/42.56 2.66 16/16 10XFFF  Breast, Left: Breast_L_Bst 3D 10/10 2.5 4/4 6X, 10X     06/2022 -  Anti-estrogen oral therapy   Anastrozole  daily     INTERVAL HISTORY:   The patient, with a history of breast cancer, presents for a routine follow-up. She reports no issues with her current regimen of anastrozole , vitamin D, and calcium.   Discussed the use of AI scribe software for clinical note transcription with the patient, who gave verbal consent to proceed.  History of Present Illness Karina Allen is a 73 year old female who presents for a routine follow-up  visit.  She is currently taking anastrozole  without any new side effects and has added vitamin D3 and a stand alone calcium supplement to her regimen.  She has been traveling frequently, which has impacted her ability to maintain her weight loss, resulting in some weight gain. Her travels included trips to [place], a beach trip, and visits to family in the [place] the [place].  She experiences back pain during activities such as vacuuming or mopping, describing it as a 'little part of my back hurts'.  She reports urinary incontinence, which she attributes to childbirth 45 years ago when her son was born face up. No blood in her stool or urine, no black stools, headaches, double vision, or falls.    Rest of the pertinent 10  point ROS reviewed and neg.  ONCOLOGY TREATMENT TEAM:  1. Surgeon:  Dr. Belinda at [facility] 2. Medical Oncologist: Dr. Loretha  3. Radiation Oncologist: Dr. Dewey    PAST MEDICAL/SURGICAL HISTORY:  Past Medical History:  Diagnosis Date   Allergy 1955   Penicillin   Arthritis    osteo   Breast cancer (HCC) 03/20/2022   Cataract 2010   Cataracts removed   Depression    Epiretinal membrane 02/05/2017   Sees Dr. Waylan and Dr. Raj   Essential hypertension 04/27/2014   FH: migraines    GAD (generalized anxiety disorder) 08/04/2015   Hepatitis B    reports dx and tx as a young child, reports no issues since and retested as adult and told did not have hepatitis, she is unsure  of type of hep she had   Personal history of radiation therapy    Thyroid  disease    Past Surgical History:  Procedure Laterality Date   APPENDECTOMY     BREAST LUMPECTOMY WITH RADIOACTIVE SEED LOCALIZATION Left 03/20/2022   Procedure: LEFT BREAST LUMPECTOMY WITH RADIOACTIVE SEED LOCALIZATION;  Surgeon: Belinda Cough, MD;  Location: [facility] OR;  Service: General;  Laterality: Left;   BREAST SURGERY  2023   CATARACT EXTRACTION     CHOLECYSTECTOMY     ELBOW  SURGERY     shattered elbow   endometrial oblation     EYE SURGERY  2012   Detached Retina   FOOT SURGERY     FRACTURE SURGERY  2001   Elbow   JOINT REPLACEMENT  Sep 20, 2021   Reverse Shoulder Left   RETINAL DETACHMENT SURGERY     REVERSE SHOULDER ARTHROPLASTY Left 09/20/2021   Procedure: LEFT REVERSE SHOULDER ARTHROPLASTY;  Surgeon: Cristy Bonner DASEN, MD;  Location: [facility];  Service: Orthopedics;  Laterality: Left;   TONSILLECTOMY AND ADENOIDECTOMY     TUBAL LIGATION       ALLERGIES:  Allergies  Allergen Reactions   Penicillins Hives    Childhood Reaction  Received Ancef  on 03-20-2022 without issue     CURRENT MEDICATIONS:  Outpatient Encounter Medications as of 06/03/2024  Medication Sig   anastrozole  (ARIMIDEX ) 1 MG tablet Take 1 tablet (1 mg total) by mouth daily.   carboxymethylcellulose (REFRESH PLUS) 0.5 % SOLN Place 1 drop into both eyes 3 (three) times daily as needed (dry eyes).   Cholecalciferol (SM VITAMIN D3) 100 MCG (4000 UT) CAPS Take 1 capsule by mouth daily. DV3 + Immune Support   hydrochlorothiazide  (HYDRODIURIL ) 25 MG tablet TAKE 1 TABLET BY MOUTH DAILY   levothyroxine  (SYNTHROID ) 175 MCG tablet TAKE 1 TABLET BY MOUTH EVERY MORNING BEFORE BREAKFAST   NON FORMULARY Take 1 capsule by mouth daily. Doterra  Food Nutrient Complex   olmesartan  (BENICAR ) 5 MG tablet TAKE 2 TABLETS BY MOUTH DAILY   [DISCONTINUED] KLOR-CON  M20 20 MEQ tablet TAKE 1 TABLET BY MOUTH DAILY FOR 5 DAYS   No facility-administered encounter medications on file as of 06/03/2024.     ONCOLOGIC FAMILY HISTORY:  Family History  Problem Relation Age of Onset   Testicular cancer Brother    Heart disease Mother    Vision loss Mother    Alcohol abuse Father      SOCIAL HISTORY:  Social History   Socioeconomic History   Marital status: Widowed    Spouse name: Not on file   Number of children: Not on file   Years of education: Not on file   Highest education level:  Master's degree (e.g., MA, MS, MEng, MEd, MSW, MBA)  Occupational History   Not on file  Tobacco Use   Smoking status: Never   Smokeless tobacco: Never  Substance and Sexual Activity   Alcohol use: Not Currently    Comment: stopped 5 years ago per patient   Drug use: No   Sexual activity: Not Currently    Birth control/protection: None  Other Topics Concern   Not on file  Social History Narrative   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[employer], 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, DAR      Home Situation: Widowed      Spiritual Beliefs: Christian      Lifestyle: some walking; working on diet      Social Drivers of Corporate investment banker Strain:  Low Risk  (01/01/2024)   Overall Financial Resource Strain (CARDIA)    Difficulty of Paying Living Expenses: Not hard at all  Food Insecurity: No Food Insecurity (01/01/2024)   Hunger Vital Sign    Worried About Running Out of Food in the Last Year: Never true    Ran Out of Food in the Last Year: Never true  Transportation Needs: No Transportation Needs (01/01/2024)   PRAPARE - Administrator, Civil Service (Medical): No    Lack of Transportation (Non-Medical): No  Physical Activity: Sufficiently Active (01/01/2024)   Exercise Vital Sign    Days of Exercise per Week: 5 days    Minutes of Exercise per Session: 40 min  Stress: No Stress Concern Present (01/01/2024)   [employer] of Occupational Health - Occupational Stress Questionnaire    Feeling of Stress : Not at all  Social Connections: Moderately Integrated (01/01/2024)   Social Connection and Isolation Panel    Frequency of Communication with Friends and Family: More than three times a week    Frequency of Social Gatherings with Friends and Family: More than three times a week    Attends Religious Services: More than 4 times per year    Active Member of [employer] or Organizations: Yes    Attends Banker Meetings: More than 4 times per year    Marital Status:  Widowed  Intimate Partner Violence: Not At Risk (12/24/2022)   Humiliation, Afraid, Rape, and Kick questionnaire    Fear of Current or Ex-Partner: No    Emotionally Abused: No    Physically Abused: No    Sexually Abused: No     OBSERVATIONS/OBJECTIVE:  BP (!) 125/52 (BP Location: Left Arm)   Temp 98.7 F (37.1 C) (Temporal)   Resp 14   Wt 223 lb 14.4 oz (101.6 kg)   SpO2 97%   BMI 39.04 kg/m  GENERAL: Patient is a well appearing female in no acute distress Breast: Bilateral breasts inspected and palpated.  No masses or regional adenopathy. No lower extremity edema  LABORATORY DATA:  None for this visit.  DIAGNOSTIC IMAGING:  None for this visit.      ASSESSMENT AND PLAN:  Karina Allen is a pleasant 73 y.o. female with Stage 0 left breast DCIS, ER+/PR+, diagnosed in 03/2022, treated with lumpectomy, adjuvant radiation therapy, and anti-estrogen therapy with Anastrozole  beginning in 06/2022.  Patient is tolerating anastrozole  extremely well. She otherwise denies any new health complaints.    Assessment and Plan Assessment & Plan Personal history of breast cancer on anastrozole  therapy Continues on anastrozole  therapy without new side effects. Mammogram results satisfactory. - Continue anastrozole  therapy. - Schedule follow-up in one year.  Osteopenia Osteopenia with T-score leaning towards osteoporosis. Currently on vitamin D3 and calcium supplementation. Encouraged weight-bearing exercises to improve bone density. - Continue vitamin D3 and calcium supplementation. - Encourage weight-bearing exercises such as walking and line dancing.  Stress urinary incontinence Experiencing stress urinary incontinence, likely related to childbirth history. Advised on non-pharmacological interventions to strengthen pelvic floor muscles. - Perform Kegel exercises regularly. - Engage in lower abdominal exercises to strengthen pelvic floor.    Total encounter time 20 minutes*in  face-to-face visit time, chart review, lab review, care coordination, order entry, and documentation of the encounter time.    *Total Encounter Time as defined by the Centers for Medicare and Medicaid Services includes, in addition to the face-to-face time of a patient visit (documented in the note above) non-face-to-face time: obtaining and  reviewing outside history, ordering and reviewing medications, tests or procedures, care coordination (communications with other health care professionals or caregivers) and documentation in the medical record.

## 2024-06-25 ENCOUNTER — Encounter: Payer: Self-pay | Admitting: Hematology and Oncology

## 2024-06-25 ENCOUNTER — Telehealth: Payer: Self-pay

## 2024-06-25 MED ORDER — ANASTROZOLE 1 MG PO TABS: 1.0000 mg | ORAL_TABLET | Freq: Every day | ORAL | 3 refills | Status: AC

## 2024-06-25 NOTE — Telephone Encounter (Signed)
 Pt called to request refill for Anastrozole . She was last seen 06/03/24. Refill sent per MD.

## 2024-08-06 ENCOUNTER — Ambulatory Visit: Admitting: Internal Medicine

## 2024-08-31 ENCOUNTER — Encounter: Payer: Self-pay | Admitting: Internal Medicine

## 2024-08-31 ENCOUNTER — Ambulatory Visit: Admitting: Internal Medicine

## 2024-08-31 VITALS — BP 128/78 | HR 90 | Temp 97.7°F | Wt 217.9 lb

## 2024-08-31 DIAGNOSIS — E7800 Pure hypercholesterolemia, unspecified: Secondary | ICD-10-CM

## 2024-08-31 DIAGNOSIS — E038 Other specified hypothyroidism: Secondary | ICD-10-CM | POA: Diagnosis not present

## 2024-08-31 DIAGNOSIS — C50512 Malignant neoplasm of lower-outer quadrant of left female breast: Secondary | ICD-10-CM

## 2024-08-31 DIAGNOSIS — I10 Essential (primary) hypertension: Secondary | ICD-10-CM

## 2024-08-31 DIAGNOSIS — R7302 Impaired glucose tolerance (oral): Secondary | ICD-10-CM | POA: Diagnosis not present

## 2024-08-31 DIAGNOSIS — Z170 Estrogen receptor positive status [ER+]: Secondary | ICD-10-CM | POA: Diagnosis not present

## 2024-08-31 LAB — POCT GLYCOSYLATED HEMOGLOBIN (HGB A1C): Hemoglobin A1C: 5.7 % — AB (ref 4.0–5.6)

## 2024-08-31 NOTE — Assessment & Plan Note (Signed)
 Well-controlled on current.

## 2024-08-31 NOTE — Assessment & Plan Note (Signed)
 Not on medication, most recent LDL 104.  Continue to work on lifestyle changes.

## 2024-08-31 NOTE — Progress Notes (Signed)
 Established Patient Office Visit     CC/Reason for Visit: Follow-up chronic conditions  HPI: Karina Allen is a 73 y.o. female who is coming in today for the above mentioned reasons. Past Medical History is significant for: Hypertension, hyperlipidemia, impaired glucose tolerance, morbid obesity, left DCIS on anastrozole .  Is feeling well.  No acute concerns or complaints.  Declines flu vaccine today despite counseling.   Past Medical/Surgical History: Past Medical History:  Diagnosis Date   Allergy 1955   Penicillin   Arthritis    osteo   Breast cancer (HCC) 03/20/2022   Cataract 2010   Cataracts removed   Depression    Epiretinal membrane 02/05/2017   Sees Dr. Waylan and Dr. Raj   Essential hypertension 04/27/2014   FH: migraines    GAD (generalized anxiety disorder) 08/04/2015   Hepatitis B    reports dx and tx as a young child, reports no issues since and retested as adult and told did not have hepatitis, she is unsure of type of hep she had   Personal history of radiation therapy    Thyroid  disease     Past Surgical History:  Procedure Laterality Date   APPENDECTOMY     BREAST LUMPECTOMY WITH RADIOACTIVE SEED LOCALIZATION Left 03/20/2022   Procedure: LEFT BREAST LUMPECTOMY WITH RADIOACTIVE SEED LOCALIZATION;  Surgeon: Karina Cough, MD;  Location: [facility] OR;  Service: General;  Laterality: Left;   BREAST SURGERY  2023   CATARACT EXTRACTION     CHOLECYSTECTOMY     ELBOW SURGERY     shattered elbow   endometrial oblation     EYE SURGERY  2012   Detached Retina   FOOT SURGERY     FRACTURE SURGERY  2001   Elbow   JOINT REPLACEMENT  Sep 20, 2021   Reverse Shoulder Left   RETINAL DETACHMENT SURGERY     REVERSE SHOULDER ARTHROPLASTY Left 09/20/2021   Procedure: LEFT REVERSE SHOULDER ARTHROPLASTY;  Surgeon: Karina Bonner DASEN, MD;  Location: [facility];  Service: Orthopedics;  Laterality: Left;   TONSILLECTOMY AND ADENOIDECTOMY     TUBAL LIGATION       Social History:  reports that she has never smoked. She has never used smokeless tobacco. She reports that she does not currently use alcohol. She reports that she does not use drugs.  Allergies: Allergies  Allergen Reactions   Penicillins Hives    Childhood Reaction  Received Ancef  on 03-20-2022 without issue    Family History:  Family History  Problem Relation Age of Onset   Testicular cancer Brother    Heart disease Mother    Vision loss Mother    Alcohol abuse Father      Current Outpatient Medications:    anastrozole  (ARIMIDEX ) 1 MG tablet, Take 1 tablet (1 mg total) by mouth daily., Disp: 90 tablet, Rfl: 3   carboxymethylcellulose (REFRESH PLUS) 0.5 % SOLN, Place 1 drop into both eyes 3 (three) times daily as needed (dry eyes)., Disp: , Rfl:    Cholecalciferol (SM VITAMIN D3) 100 MCG (4000 UT) CAPS, Take 1 capsule by mouth daily. DV3 + Immune Support, Disp: , Rfl:    hydrochlorothiazide  (HYDRODIURIL ) 25 MG tablet, TAKE 1 TABLET BY MOUTH DAILY, Disp: 90 tablet, Rfl: 1   levothyroxine  (SYNTHROID ) 175 MCG tablet, TAKE 1 TABLET BY MOUTH EVERY MORNING BEFORE BREAKFAST, Disp: 90 tablet, Rfl: 1   NON FORMULARY, Take 1 capsule by mouth daily. Doterra  Food Nutrient Complex, Disp: ,  Rfl:    olmesartan  (BENICAR ) 5 MG tablet, TAKE 2 TABLETS BY MOUTH DAILY, Disp: 180 tablet, Rfl: 1  Review of Systems:  Negative unless indicated in HPI.   Physical Exam: Vitals:   08/31/24 1031 08/31/24 1036  BP: 130/80 128/78  Pulse: 90   Temp: 97.7 F (36.5 C)   TempSrc: Oral   SpO2: 98%   Weight: 217 lb 14.4 oz (98.8 kg)     Body mass index is 37.99 kg/m.   Physical Exam Vitals reviewed.  Constitutional:      Appearance: Normal appearance. She is obese.  HENT:     Head: Normocephalic and atraumatic.  Eyes:     Conjunctiva/sclera: Conjunctivae normal.  Cardiovascular:     Rate and Rhythm: Normal rate and regular rhythm.  Pulmonary:     Effort: Pulmonary effort is normal.      Breath sounds: Normal breath sounds.  Skin:    General: Skin is warm and dry.  Neurological:     General: No focal deficit present.     Mental Status: She is alert and oriented to person, place, and time.  Psychiatric:        Mood and Affect: Mood normal.        Behavior: Behavior normal.        Thought Content: Thought content normal.        Judgment: Judgment normal.      Impression and Plan:  IGT (impaired glucose tolerance) Assessment & Plan: Stable with an A1c of 5.7.  Continue to work on lifestyle changes.  Orders: -     POCT glycosylated hemoglobin (Hb A1C)  Morbid obesity (HCC) Assessment & Plan: -Discussed healthy lifestyle, including increased physical activity and better food choices to promote weight loss. - Congratulated on weight loss success thus far.   Essential hypertension Assessment & Plan: Well-controlled on current.   Other specified hypothyroidism Assessment & Plan: Recent TSH within range on 175 mcg of levothyroxine .   Pure hypercholesterolemia Assessment & Plan: Not on medication, most recent LDL 104.  Continue to work on lifestyle changes.   Malignant neoplasm of lower-outer quadrant of left breast of female, estrogen receptor positive (HCC) Assessment & Plan: Followed by oncology, on anastrozole .      Time spent:32 minutes reviewing chart, interviewing and examining patient and formulating plan of care.     Karina Theophilus Andrews, MD [facility]

## 2024-08-31 NOTE — Assessment & Plan Note (Signed)
Recent TSH within range on 175 mcg of levothyroxine.

## 2024-08-31 NOTE — Assessment & Plan Note (Signed)
-  Discussed healthy lifestyle, including increased physical activity and better food choices to promote weight loss. -Congratulated on weight loss success thus far.

## 2024-08-31 NOTE — Assessment & Plan Note (Signed)
Followed by oncology, on anastrozole.

## 2024-08-31 NOTE — Assessment & Plan Note (Signed)
 Stable with an A1c of 5.7.  Continue to work on lifestyle changes.

## 2024-09-14 DIAGNOSIS — Z96612 Presence of left artificial shoulder joint: Secondary | ICD-10-CM | POA: Diagnosis not present

## 2024-09-14 DIAGNOSIS — Z743 Need for continuous supervision: Secondary | ICD-10-CM | POA: Diagnosis not present

## 2024-09-14 DIAGNOSIS — M9732XA Periprosthetic fracture around internal prosthetic left shoulder joint, initial encounter: Secondary | ICD-10-CM | POA: Diagnosis not present

## 2024-09-14 DIAGNOSIS — Z880 Allergy status to penicillin: Secondary | ICD-10-CM | POA: Diagnosis not present

## 2024-09-14 DIAGNOSIS — S4292XA Fracture of left shoulder girdle, part unspecified, initial encounter for closed fracture: Secondary | ICD-10-CM | POA: Diagnosis not present

## 2024-09-14 DIAGNOSIS — W010XXA Fall on same level from slipping, tripping and stumbling without subsequent striking against object, initial encounter: Secondary | ICD-10-CM | POA: Diagnosis not present

## 2024-09-14 DIAGNOSIS — R0989 Other specified symptoms and signs involving the circulatory and respiratory systems: Secondary | ICD-10-CM | POA: Diagnosis not present

## 2024-09-15 DIAGNOSIS — S4992XA Unspecified injury of left shoulder and upper arm, initial encounter: Secondary | ICD-10-CM | POA: Diagnosis not present

## 2024-09-15 DIAGNOSIS — M9732XA Periprosthetic fracture around internal prosthetic left shoulder joint, initial encounter: Secondary | ICD-10-CM | POA: Diagnosis not present

## 2024-09-15 DIAGNOSIS — R0989 Other specified symptoms and signs involving the circulatory and respiratory systems: Secondary | ICD-10-CM

## 2024-09-15 DIAGNOSIS — W1831XA Fall on same level due to stepping on an object, initial encounter: Secondary | ICD-10-CM | POA: Diagnosis not present

## 2024-09-15 HISTORY — DX: Other specified symptoms and signs involving the circulatory and respiratory systems: R09.89

## 2024-09-16 ENCOUNTER — Encounter: Payer: Self-pay | Admitting: Orthopaedic Surgery

## 2024-09-16 ENCOUNTER — Encounter (HOSPITAL_COMMUNITY): Payer: Self-pay | Admitting: Orthopaedic Surgery

## 2024-09-16 ENCOUNTER — Ambulatory Visit
Admission: RE | Admit: 2024-09-16 | Discharge: 2024-09-16 | Disposition: A | Discharge status: Home / Self Care | Source: Ambulatory Visit | Attending: Orthopaedic Surgery | Admitting: Orthopaedic Surgery

## 2024-09-16 ENCOUNTER — Other Ambulatory Visit: Payer: Self-pay | Admitting: Orthopaedic Surgery

## 2024-09-16 ENCOUNTER — Other Ambulatory Visit: Payer: Self-pay

## 2024-09-16 DIAGNOSIS — S42202A Unspecified fracture of upper end of left humerus, initial encounter for closed fracture: Secondary | ICD-10-CM

## 2024-09-16 DIAGNOSIS — S42352A Displaced comminuted fracture of shaft of humerus, left arm, initial encounter for closed fracture: Secondary | ICD-10-CM | POA: Diagnosis not present

## 2024-09-16 NOTE — Progress Notes (Addendum)
 For Anesthesia: PCP - Tully CINDERELLA Theophilus Delma, MD last office visit 08/31/24 in [facility] Cardiologist - N/A Arnold Ash, MD   last office visit note 06/03/24 in [facility]  Bowel Prep reminder: N/A  Chest x-ray - 09/15/24 [facility] EKG - greater than 1 year Stress Test - N/A ECHO - N/A Cardiac Cath - N/A Pacemaker/ICD device last checked:N/A Pacemaker orders received: N/A Device Rep notified: N/A  Spinal Cord Stimulator: N/A  Sleep Study - N/A CPAP - N/A  Fasting Blood Sugar - N/A Checks Blood Sugar __N/A___ times a day Date and result of last Hgb A1c-N/A  Last dose of GLP1 agonist- N/A GLP1 instructions: Hold 7 days prior to schedule (Hold 24 hours-daily)   Last dose of SGLT-2 inhibitors- N/A SGLT-2 instructions: Hold 72 hours prior to surgery  Blood Thinner Instructions:N/A Last Dose:N/A Time last taken:N/A  Aspirin  Instructions:N/A Last Dose:N/A Time last taken:N/A  Activity level: Can go up a flight of stairs and activities of daily living without stopping and without chest pain and/or shortness of breath  Anesthesia review: N/A  Patient denies shortness of breath, fever, cough and chest pain at PAT appointment   Patient verbalized understanding of instructions that were reviewed over the telephone.

## 2024-09-16 NOTE — H&P (Signed)
 PREOPERATIVE H&P  Chief Complaint: left proximal humerus fracture  HPI: Karina Allen is a 73 y.o. female who is scheduled for Procedure(s): REVISION, REVERSE TOTAL ARTHROPLASTY, SHOULDER OPEN REDUCTION INTERNAL FIXATION (ORIF) HUMERAL SHAFT FRACTURE.   A 73 year old female presents for evaluation following a fall resulting in a fracture. The patient reports tripping on a rug while out of town, leading to a comminuted fracture of the proximal humerus. She has a history of shoulder replacement due to a malunion in 2022, which was complicated by a fragment moving into the shoulder. The patient describes severe pain at the fracture site. She denies any issues with heart, lungs, or diabetes. The patient is concerned about the stability of her implant.     Symptoms are rated as moderate to severe, and have been worsening.  This is significantly impairing activities of daily living.    Please see clinic note for further details on this patient's care.    She has elected for surgical management.   Past Medical History:  Diagnosis Date   Allergy 1955   Penicillin   Arthritis    osteo   Breast cancer (HCC) 03/20/2022   Left   Cataract 2010   Cataracts removed   Depression    Epiretinal membrane 02/05/2017   Sees Dr. Waylan and Dr. Raj   Essential hypertension 04/27/2014   FH: migraines    GAD (generalized anxiety disorder) 08/04/2015   Hepatitis B 1961   reports dx and tx as a young child, reports no issues since and retested as adult and told did not have hepatitis, she is unsure of type of hep she had   Hyperlipidemia    Hypothyroidism    Morbid obesity (HCC)    Personal history of radiation therapy    Pulmonary vascular congestion 09/15/2024   Noted on CXR   Thyroid  disease    Past Surgical History:  Procedure Laterality Date   APPENDECTOMY     BREAST LUMPECTOMY WITH RADIOACTIVE SEED LOCALIZATION Left 03/20/2022   Procedure: LEFT BREAST LUMPECTOMY WITH RADIOACTIVE  SEED LOCALIZATION;  Surgeon: Belinda Cough, MD;  Location: [facility] OR;  Service: General;  Laterality: Left;   CATARACT EXTRACTION Bilateral    CHOLECYSTECTOMY     ELBOW SURGERY     shattered elbow   ENDOMETRIAL ABLATION     FOOT SURGERY Bilateral    Bunion   FRACTURE SURGERY  2001   Left elbow   RETINAL DETACHMENT SURGERY Left 2012   REVERSE SHOULDER ARTHROPLASTY Left 09/20/2021   Procedure: LEFT REVERSE SHOULDER ARTHROPLASTY;  Surgeon: Cristy Bonner DASEN, MD;  Location: [facility];  Service: Orthopedics;  Laterality: Left;   TONSILLECTOMY AND ADENOIDECTOMY     TUBAL LIGATION     Social History   Socioeconomic History   Marital status: Widowed    Spouse name: Not on file   Number of children: Not on file   Years of education: Not on file   Highest education level: Master's degree (e.g., MA, MS, MEng, MEd, MSW, MBA)  Occupational History   Not on file  Tobacco Use   Smoking status: Never   Smokeless tobacco: Never  Vaping Use   Vaping status: Never Used  Substance and Sexual Activity   Alcohol use: Not Currently    Comment: stopped 5 years ago per patient   Drug use: No   Sexual activity: Not Currently    Birth control/protection: Surgical  Other Topics Concern   Not on file  Social History  Narrative   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[employer], 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, DAR      Home Situation: Widowed      Spiritual Beliefs: Christian      Lifestyle: some walking; working on diet      Social Drivers of Health   Financial Resource Strain: Low Risk  (08/27/2024)   Overall Financial Resource Strain (CARDIA)    Difficulty of Paying Living Expenses: Not hard at all  Food Insecurity: No Food Insecurity (08/27/2024)   Hunger Vital Sign    Worried About Running Out of Food in the Last Year: Never true    Ran Out of Food in the Last Year: Never true  Transportation Needs: No Transportation Needs (08/27/2024)   PRAPARE - Administrator, Civil Service  (Medical): No    Lack of Transportation (Non-Medical): No  Physical Activity: Sufficiently Active (08/27/2024)   Exercise Vital Sign    Days of Exercise per Week: 3 days    Minutes of Exercise per Session: 50 min  Stress: No Stress Concern Present (08/27/2024)   [employer] of Occupational Health - Occupational Stress Questionnaire    Feeling of Stress: Not at all  Social Connections: Moderately Integrated (08/27/2024)   Social Connection and Isolation Panel    Frequency of Communication with Friends and Family: More than three times a week    Frequency of Social Gatherings with Friends and Family: More than three times a week    Attends Religious Services: More than 4 times per year    Active Member of [employer] or Organizations: Yes    Attends Banker Meetings: More than 4 times per year    Marital Status: Widowed   Family History  Problem Relation Age of Onset   Testicular cancer Brother    Heart disease Mother    Vision loss Mother    Alcohol abuse Father    Allergies  Allergen Reactions   Penicillins Hives    Childhood Reaction  Received Ancef  on 03-20-2022 without issue   Prior to Admission medications   Medication Sig Start Date End Date Taking? Authorizing Provider  anastrozole  (ARIMIDEX ) 1 MG tablet Take 1 tablet (1 mg total) by mouth daily. 06/25/24  Yes Iruku, Praveena, MD  carboxymethylcellulose (REFRESH PLUS) 0.5 % SOLN Place 1 drop into both eyes 3 (three) times daily as needed (dry eyes).   Yes [provider]  hydrochlorothiazide  (HYDRODIURIL ) 25 MG tablet TAKE 1 TABLET BY MOUTH DAILY 05/04/24  Yes Theophilus Andrews, Tully GRADE, MD  levothyroxine  (SYNTHROID ) 175 MCG tablet TAKE 1 TABLET BY MOUTH EVERY MORNING BEFORE BREAKFAST 05/04/24  Yes Theophilus Andrews, Tully GRADE, MD  morphine (MSIR) 15 MG tablet Take 7.5 mg by mouth every 4 (four) hours as needed for moderate pain (pain score 4-6) or severe pain (pain score 7-10). 09/15/24 09/20/24 Yes  [provider]  olmesartan  (BENICAR ) 5 MG tablet TAKE 2 TABLETS BY MOUTH DAILY 05/04/24  Yes Theophilus Andrews, Tully GRADE, MD  CALCIUM PO Take 1 tablet by mouth daily at 12 noon. Patient not taking: Reported on 09/16/2024    [provider]  Cholecalciferol (SM VITAMIN D3) 100 MCG (4000 UT) CAPS Take 1 capsule by mouth daily. DV3 + Immune Support Patient not taking: Reported on 09/16/2024    [provider]  OVER THE COUNTER MEDICATION Nutradyn (multiple vitamins in a packet) Patient not taking: Reported on 09/16/2024    [provider]  Red Yeast Rice Extract (RED YEAST RICE PO)  Take 2 capsules by mouth daily. Patient not taking: Reported on 09/16/2024    [provider]    ROS: All other systems have been reviewed and were otherwise negative with the exception of those mentioned in the HPI and as above.  Physical Exam: General: Alert, no acute distress Cardiovascular: No pedal edema Respiratory: No cyanosis, no use of accessory musculature GI: No organomegaly, abdomen is soft and non-tender Skin: No lesions in the area of chief complaint Neurologic: Sensation intact distally Psychiatric: Patient is competent for consent with normal mood and affect Lymphatic: No axillary or cervical lymphadenopathy  MUSCULOSKELETAL:  Axillary nerve sensation appears to be normal. Distal motor and sensory function is otherwise normal. The patient is able to wiggle her fingers without difficulty. Limited exam secondary to known fracture  Imaging: X-rays from an external facility demonstrate a comminuted fracture of the proximal humerus with instability around the implant.   A stat CT scan is ordered to evaluate bone stock and determine the appropriate surgical intervention.  BMI: Estimated body mass index is 36.67 kg/m as calculated from the following:   Height as of this encounter: 5' 4.5 (1.638 m).   Weight as of this encounter: 98.4 kg.  Lab Results   Component Value Date   ALBUMIN 4.3 03/04/2024   Diabetes: Patient does not have a diagnosis of diabetes. Lab Results  Component Value Date   HGBA1C 5.7 (A) 08/31/2024     Smoking Status:   reports that she has never smoked. She has never used smokeless tobacco.     Assessment: left proximal humerus fracture  Plan: Plan for Procedure(s): REVISION, REVERSE TOTAL ARTHROPLASTY, SHOULDER OPEN REDUCTION INTERNAL FIXATION (ORIF) HUMERAL SHAFT FRACTURE   - Obtain a stat CT scan to evaluate bone stock and determine the appropriate surgical intervention.  - Discussed surgical options: open reduction internal fixation versus revision reverse shoulder arthroplasty.  - Plan to proceed with open reduction internal fixation if feasible, as it is less invasive and preferred for both patient and provider.  - If open reduction is not viable, consider revision arthroplasty, which involves replacing the stem and potentially the socket.  The risks benefits and alternatives were discussed with the patient including but not limited to the risks of nonoperative treatment, versus surgical intervention including infection, bleeding, nerve injury,  blood clots, cardiopulmonary complications, morbidity, mortality, among others, and they were willing to proceed.   We additionally specifically discussed risks of axillary nerve injury, infection, periprosthetic fracture, continued pain and longevity of implants prior to beginning procedure.    Patient will be closely monitored in PACU for medical stabilization and pain control. If found stable in PACU, patient may be discharged home with outpatient follow-up. If any concerns regarding patient's stabilization patient will be admitted for observation after surgery. The patient is planning to be discharged home with outpatient PT.   The patient acknowledged the explanation, agreed to proceed with the plan and consent was signed.   Operative Plan: see  above Discharge Medications: standard DVT Prophylaxis: aspirin  Physical Therapy: delayed Special Discharge needs: Sling. IceMan   Aleck LOISE Stalling, PA-C  09/16/2024 11:55 AM

## 2024-09-17 ENCOUNTER — Inpatient Hospital Stay (HOSPITAL_COMMUNITY)

## 2024-09-17 ENCOUNTER — Inpatient Hospital Stay (HOSPITAL_COMMUNITY): Admitting: Anesthesiology

## 2024-09-17 ENCOUNTER — Encounter: Admission: RE | Disposition: A | Payer: Self-pay | Attending: Orthopaedic Surgery

## 2024-09-17 ENCOUNTER — Inpatient Hospital Stay (HOSPITAL_COMMUNITY)
Admission: RE | Admit: 2024-09-17 | Discharge: 2024-09-18 | DRG: 483 | Disposition: A | Discharge status: Home / Self Care | Attending: Orthopaedic Surgery | Admitting: Orthopaedic Surgery

## 2024-09-17 ENCOUNTER — Encounter (HOSPITAL_COMMUNITY): Payer: Self-pay | Admitting: Orthopaedic Surgery

## 2024-09-17 DIAGNOSIS — Z01818 Encounter for other preprocedural examination: Secondary | ICD-10-CM

## 2024-09-17 DIAGNOSIS — W19XXXA Unspecified fall, initial encounter: Secondary | ICD-10-CM | POA: Diagnosis present

## 2024-09-17 DIAGNOSIS — S42202A Unspecified fracture of upper end of left humerus, initial encounter for closed fracture: Secondary | ICD-10-CM | POA: Diagnosis not present

## 2024-09-17 DIAGNOSIS — Z853 Personal history of malignant neoplasm of breast: Secondary | ICD-10-CM

## 2024-09-17 DIAGNOSIS — S42352A Displaced comminuted fracture of shaft of humerus, left arm, initial encounter for closed fracture: Secondary | ICD-10-CM | POA: Diagnosis not present

## 2024-09-17 DIAGNOSIS — Z9889 Other specified postprocedural states: Secondary | ICD-10-CM | POA: Diagnosis not present

## 2024-09-17 DIAGNOSIS — M9732XA Periprosthetic fracture around internal prosthetic left shoulder joint, initial encounter: Secondary | ICD-10-CM | POA: Diagnosis present

## 2024-09-17 DIAGNOSIS — Z6836 Body mass index (BMI) 36.0-36.9, adult: Secondary | ICD-10-CM | POA: Diagnosis not present

## 2024-09-17 DIAGNOSIS — Z821 Family history of blindness and visual loss: Secondary | ICD-10-CM | POA: Diagnosis not present

## 2024-09-17 DIAGNOSIS — Z9049 Acquired absence of other specified parts of digestive tract: Secondary | ICD-10-CM

## 2024-09-17 DIAGNOSIS — Z811 Family history of alcohol abuse and dependence: Secondary | ICD-10-CM | POA: Diagnosis not present

## 2024-09-17 DIAGNOSIS — Z8043 Family history of malignant neoplasm of testis: Secondary | ICD-10-CM | POA: Diagnosis not present

## 2024-09-17 DIAGNOSIS — I10 Essential (primary) hypertension: Principal | ICD-10-CM | POA: Diagnosis present

## 2024-09-17 DIAGNOSIS — Z96612 Presence of left artificial shoulder joint: Secondary | ICD-10-CM | POA: Diagnosis not present

## 2024-09-17 DIAGNOSIS — Z880 Allergy status to penicillin: Secondary | ICD-10-CM

## 2024-09-17 DIAGNOSIS — S42302A Unspecified fracture of shaft of humerus, left arm, initial encounter for closed fracture: Secondary | ICD-10-CM | POA: Diagnosis not present

## 2024-09-17 DIAGNOSIS — E039 Hypothyroidism, unspecified: Secondary | ICD-10-CM | POA: Diagnosis not present

## 2024-09-17 DIAGNOSIS — Z79890 Hormone replacement therapy (postmenopausal): Secondary | ICD-10-CM

## 2024-09-17 DIAGNOSIS — Z8249 Family history of ischemic heart disease and other diseases of the circulatory system: Secondary | ICD-10-CM | POA: Diagnosis not present

## 2024-09-17 DIAGNOSIS — Z79811 Long term (current) use of aromatase inhibitors: Secondary | ICD-10-CM | POA: Diagnosis not present

## 2024-09-17 DIAGNOSIS — E785 Hyperlipidemia, unspecified: Secondary | ICD-10-CM | POA: Diagnosis present

## 2024-09-17 DIAGNOSIS — G8918 Other acute postprocedural pain: Secondary | ICD-10-CM | POA: Diagnosis not present

## 2024-09-17 DIAGNOSIS — Z923 Personal history of irradiation: Secondary | ICD-10-CM | POA: Diagnosis not present

## 2024-09-17 DIAGNOSIS — S42292A Other displaced fracture of upper end of left humerus, initial encounter for closed fracture: Secondary | ICD-10-CM | POA: Diagnosis not present

## 2024-09-17 HISTORY — PX: ORIF HUMERUS FRACTURE: SHX2126

## 2024-09-17 HISTORY — DX: Morbid (severe) obesity due to excess calories: E66.01

## 2024-09-17 HISTORY — DX: Hyperlipidemia, unspecified: E78.5

## 2024-09-17 HISTORY — DX: Hypothyroidism, unspecified: E03.9

## 2024-09-17 HISTORY — PX: REVISION TOTAL SHOULDER TO REVERSE TOTAL SHOULDER: SHX6313

## 2024-09-17 LAB — BASIC METABOLIC PANEL WITH GFR
Anion gap: 11 (ref 5–15)
BUN: 13 mg/dL (ref 8–23)
CO2: 24 mmol/L (ref 22–32)
Calcium: 9.4 mg/dL (ref 8.9–10.3)
Chloride: 101 mmol/L (ref 98–111)
Creatinine, Ser: 0.72 mg/dL (ref 0.44–1.00)
GFR, Estimated: >60 mL/min (ref >60)
Glucose, Bld: 111 mg/dL — ABNORMAL HIGH (ref 70–99)
Potassium: 2.9 mmol/L — ABNORMAL LOW (ref 3.5–5.1)
Sodium: 136 mmol/L (ref 135–145)

## 2024-09-17 LAB — CBC
HCT: 31.5 % — ABNORMAL LOW (ref 36.0–46.0)
Hemoglobin: 10.8 g/dL — ABNORMAL LOW (ref 12.0–15.0)
MCH: 28.9 pg (ref 26.0–34.0)
MCHC: 34.3 g/dL (ref 30.0–36.0)
MCV: 84.2 fL (ref 80.0–100.0)
Platelets: 256 K/uL (ref 150–400)
RBC: 3.74 MIL/uL — ABNORMAL LOW (ref 3.87–5.11)
RDW: 12.7 % (ref 11.5–15.5)
WBC: 9.4 K/uL (ref 4.0–10.5)
nRBC: 0 % (ref 0.0–0.2)

## 2024-09-17 LAB — TYPE AND SCREEN
ABO/RH(D): A POS
Antibody Screen: NEGATIVE

## 2024-09-17 LAB — ABO/RH: ABO/RH(D): A POS

## 2024-09-17 LAB — SURGICAL PCR SCREEN
MRSA, PCR: NEGATIVE
Staphylococcus aureus: NEGATIVE

## 2024-09-17 SURGERY — REVISION, REVERSE TOTAL ARTHROPLASTY, SHOULDER
Anesthesia: Regional | Site: Shoulder | Laterality: Left

## 2024-09-17 MED ORDER — ROCURONIUM BROMIDE 10 MG/ML (PF) SYRINGE
PREFILLED_SYRINGE | INTRAVENOUS | Status: AC
  Filled 2024-09-17: qty 10

## 2024-09-17 MED ORDER — LIDOCAINE HCL (PF) 2 % IJ SOLN
INTRAMUSCULAR | Status: AC
  Filled 2024-09-17: qty 5

## 2024-09-17 MED ORDER — EPHEDRINE SULFATE-NACL 50-0.9 MG/10ML-% IV SOSY
PREFILLED_SYRINGE | INTRAVENOUS | Status: DC | PRN
  Administered 2024-09-17: 10 mg via INTRAVENOUS
  Administered 2024-09-17: 5 mg via INTRAVENOUS
  Administered 2024-09-17 (×2): 10 mg via INTRAVENOUS

## 2024-09-17 MED ORDER — OXYCODONE HCL 5 MG PO TABS: 5.0000 mg | ORAL_TABLET | Freq: Once | ORAL | Status: DC | PRN

## 2024-09-17 MED ORDER — CELECOXIB 200 MG PO CAPS
200.0000 mg | ORAL_CAPSULE | Freq: Two times a day (BID) | ORAL | Status: DC
  Administered 2024-09-17 – 2024-09-18 (×2): 200 mg via ORAL
  Filled 2024-09-17 (×2): qty 1

## 2024-09-17 MED ORDER — ROCURONIUM BROMIDE 10 MG/ML (PF) SYRINGE
PREFILLED_SYRINGE | INTRAVENOUS | Status: DC | PRN
  Administered 2024-09-17: 40 mg via INTRAVENOUS
  Administered 2024-09-17: 60 mg via INTRAVENOUS

## 2024-09-17 MED ORDER — LACTATED RINGERS IV SOLN: INTRAVENOUS | Status: DC

## 2024-09-17 MED ORDER — CEFAZOLIN SODIUM-DEXTROSE 2-4 GM/100ML-% IV SOLN
2.0000 g | INTRAVENOUS | Status: AC
  Administered 2024-09-17: 2 g via INTRAVENOUS
  Filled 2024-09-17: qty 100

## 2024-09-17 MED ORDER — POLYETHYLENE GLYCOL 3350 17 G PO PACK
17.0000 g | PACK | Freq: Every day | ORAL | Status: DC | PRN
  Administered 2024-09-17: 17 g via ORAL
  Filled 2024-09-17: qty 1

## 2024-09-17 MED ORDER — DIPHENHYDRAMINE HCL 12.5 MG/5ML PO ELIX: 12.5000 mg | ORAL_SOLUTION | ORAL | Status: DC | PRN

## 2024-09-17 MED ORDER — ONDANSETRON HCL 4 MG/2ML IJ SOLN: 4.0000 mg | Freq: Once | INTRAMUSCULAR | Status: DC | PRN

## 2024-09-17 MED ORDER — PHENOL 1.4 % MT LIQD
1.0000 | OROMUCOSAL | Status: DC | PRN
Start: 2024-09-17 — End: 2024-09-18

## 2024-09-17 MED ORDER — MAGNESIUM CITRATE PO SOLN: 1.0000 | Freq: Once | ORAL | Status: DC | PRN

## 2024-09-17 MED ORDER — PROPOFOL 10 MG/ML IV BOLUS
INTRAVENOUS | Status: AC
  Filled 2024-09-17: qty 20

## 2024-09-17 MED ORDER — FENTANYL CITRATE (PF) 50 MCG/ML IJ SOSY: 25.0000 ug | PREFILLED_SYRINGE | INTRAMUSCULAR | Status: DC | PRN

## 2024-09-17 MED ORDER — HYDROCHLOROTHIAZIDE 25 MG PO TABS
25.0000 mg | ORAL_TABLET | Freq: Every day | ORAL | Status: DC
  Administered 2024-09-18: 25 mg via ORAL
  Filled 2024-09-17: qty 1

## 2024-09-17 MED ORDER — AMISULPRIDE (ANTIEMETIC) 5 MG/2ML IV SOLN: 10.0000 mg | Freq: Once | INTRAVENOUS | Status: DC | PRN

## 2024-09-17 MED ORDER — ONDANSETRON HCL 4 MG/2ML IJ SOLN: 4.0000 mg | Freq: Four times a day (QID) | INTRAMUSCULAR | Status: DC | PRN

## 2024-09-17 MED ORDER — PHENYLEPHRINE 80 MCG/ML (10ML) SYRINGE FOR IV PUSH (FOR BLOOD PRESSURE SUPPORT)
PREFILLED_SYRINGE | INTRAVENOUS | Status: DC | PRN
  Administered 2024-09-17 (×2): 80 ug via INTRAVENOUS
  Administered 2024-09-17: 160 ug via INTRAVENOUS

## 2024-09-17 MED ORDER — METHOCARBAMOL 500 MG PO TABS
500.0000 mg | ORAL_TABLET | Freq: Four times a day (QID) | ORAL | Status: DC | PRN
Start: 2024-09-17 — End: 2024-09-18

## 2024-09-17 MED ORDER — DEXAMETHASONE SOD PHOSPHATE PF 10 MG/ML IJ SOLN
INTRAMUSCULAR | Status: DC | PRN
  Administered 2024-09-17: 10 mg via INTRAVENOUS

## 2024-09-17 MED ORDER — VANCOMYCIN HCL 1000 MG IV SOLR
INTRAVENOUS | Status: AC
  Filled 2024-09-17: qty 20

## 2024-09-17 MED ORDER — BUPIVACAINE-EPINEPHRINE (PF) 0.5% -1:200000 IJ SOLN
INTRAMUSCULAR | Status: DC | PRN
  Administered 2024-09-17: 10 mL

## 2024-09-17 MED ORDER — PROPOFOL 10 MG/ML IV BOLUS
INTRAVENOUS | Status: DC | PRN
  Administered 2024-09-17: 30 mg via INTRAVENOUS
  Administered 2024-09-17: 20 mg via INTRAVENOUS
  Administered 2024-09-17: 30 mg via INTRAVENOUS
  Administered 2024-09-17: 100 mg via INTRAVENOUS
  Administered 2024-09-17: 20 mg via INTRAVENOUS

## 2024-09-17 MED ORDER — PHENYLEPHRINE HCL-NACL 20-0.9 MG/250ML-% IV SOLN
INTRAVENOUS | Status: AC
  Filled 2024-09-17: qty 250

## 2024-09-17 MED ORDER — BISACODYL 10 MG RE SUPP
10.0000 mg | Freq: Every day | RECTAL | Status: DC | PRN
Start: 2024-09-17 — End: 2024-09-18

## 2024-09-17 MED ORDER — LEVOTHYROXINE SODIUM 25 MCG PO TABS
175.0000 ug | ORAL_TABLET | Freq: Every day | ORAL | Status: DC
  Administered 2024-09-18: 175 ug via ORAL
  Filled 2024-09-17 (×2): qty 1
  Filled 2024-09-17: qty 2

## 2024-09-17 MED ORDER — TRANEXAMIC ACID-NACL 1000-0.7 MG/100ML-% IV SOLN
1000.0000 mg | Freq: Once | INTRAVENOUS | Status: AC
  Administered 2024-09-17: 1000 mg via INTRAVENOUS
  Filled 2024-09-17: qty 100

## 2024-09-17 MED ORDER — ONDANSETRON HCL 4 MG PO TABS: 4.0000 mg | ORAL_TABLET | Freq: Four times a day (QID) | ORAL | Status: DC | PRN

## 2024-09-17 MED ORDER — DOCUSATE SODIUM 100 MG PO CAPS
100.0000 mg | ORAL_CAPSULE | Freq: Two times a day (BID) | ORAL | Status: DC
  Administered 2024-09-17 – 2024-09-18 (×2): 100 mg via ORAL
  Filled 2024-09-17 (×2): qty 1

## 2024-09-17 MED ORDER — ACETAMINOPHEN 500 MG PO TABS
1000.0000 mg | ORAL_TABLET | Freq: Four times a day (QID) | ORAL | Status: DC
  Administered 2024-09-17 – 2024-09-18 (×2): 1000 mg via ORAL
  Filled 2024-09-17 (×2): qty 2

## 2024-09-17 MED ORDER — METHOCARBAMOL 1000 MG/10ML IJ SOLN
500.0000 mg | Freq: Four times a day (QID) | INTRAMUSCULAR | Status: DC | PRN
Start: 2024-09-17 — End: 2024-09-18

## 2024-09-17 MED ORDER — VANCOMYCIN HCL 1 G IV SOLR
INTRAVENOUS | Status: DC | PRN
  Administered 2024-09-17: 1000 mg via TOPICAL

## 2024-09-17 MED ORDER — FENTANYL CITRATE (PF) 250 MCG/5ML IJ SOLN
INTRAMUSCULAR | Status: DC | PRN
  Administered 2024-09-17: 50 ug via INTRAVENOUS

## 2024-09-17 MED ORDER — LIDOCAINE HCL (CARDIAC) PF 100 MG/5ML IV SOSY
PREFILLED_SYRINGE | INTRAVENOUS | Status: DC | PRN
  Administered 2024-09-17: 100 mg via INTRAVENOUS

## 2024-09-17 MED ORDER — ONDANSETRON HCL 4 MG/2ML IJ SOLN
INTRAMUSCULAR | Status: AC
  Filled 2024-09-17: qty 2

## 2024-09-17 MED ORDER — OXYCODONE HCL 5 MG PO TABS: 5.0000 mg | ORAL_TABLET | ORAL | Status: DC | PRN

## 2024-09-17 MED ORDER — PHENYLEPHRINE HCL-NACL 20-0.9 MG/250ML-% IV SOLN
INTRAVENOUS | Status: DC | PRN
  Administered 2024-09-17: 50 ug/min via INTRAVENOUS

## 2024-09-17 MED ORDER — FENTANYL CITRATE (PF) 50 MCG/ML IJ SOSY
50.0000 ug | PREFILLED_SYRINGE | INTRAMUSCULAR | Status: DC
  Administered 2024-09-17: 100 ug via INTRAVENOUS
  Filled 2024-09-17: qty 2

## 2024-09-17 MED ORDER — EPHEDRINE 5 MG/ML INJ
INTRAVENOUS | Status: AC
  Filled 2024-09-17: qty 5

## 2024-09-17 MED ORDER — FENTANYL CITRATE (PF) 100 MCG/2ML IJ SOLN
INTRAMUSCULAR | Status: AC
  Filled 2024-09-17: qty 2

## 2024-09-17 MED ORDER — ORAL CARE MOUTH RINSE: 15.0000 mL | Freq: Once | OROMUCOSAL | Status: AC

## 2024-09-17 MED ORDER — OXYCODONE HCL 5 MG/5ML PO SOLN: 5.0000 mg | Freq: Once | ORAL | Status: DC | PRN

## 2024-09-17 MED ORDER — MENTHOL 3 MG MT LOZG: 1.0000 | LOZENGE | OROMUCOSAL | Status: DC | PRN

## 2024-09-17 MED ORDER — SUGAMMADEX SODIUM 200 MG/2ML IV SOLN
INTRAVENOUS | Status: AC
  Filled 2024-09-17: qty 2

## 2024-09-17 MED ORDER — 0.9 % SODIUM CHLORIDE (POUR BTL) OPTIME
TOPICAL | Status: DC | PRN
  Administered 2024-09-17: 1000 mL

## 2024-09-17 MED ORDER — METOCLOPRAMIDE HCL 5 MG PO TABS: 5.0000 mg | ORAL_TABLET | Freq: Three times a day (TID) | ORAL | Status: DC | PRN

## 2024-09-17 MED ORDER — ONDANSETRON HCL 4 MG/2ML IJ SOLN
INTRAMUSCULAR | Status: DC | PRN
  Administered 2024-09-17: 4 mg via INTRAVENOUS

## 2024-09-17 MED ORDER — PRONTOSAN WOUND IRRIGATION OPTIME
TOPICAL | Status: DC | PRN
  Administered 2024-09-17: 1 via TOPICAL

## 2024-09-17 MED ORDER — SUGAMMADEX SODIUM 200 MG/2ML IV SOLN
INTRAVENOUS | Status: DC | PRN
  Administered 2024-09-17: 200 mg via INTRAVENOUS

## 2024-09-17 MED ORDER — ALBUMIN HUMAN 5 % IV SOLN: INTRAVENOUS | Status: DC | PRN

## 2024-09-17 MED ORDER — CEFAZOLIN SODIUM-DEXTROSE 2-4 GM/100ML-% IV SOLN
2.0000 g | Freq: Four times a day (QID) | INTRAVENOUS | Status: AC
  Administered 2024-09-17 – 2024-09-18 (×2): 2 g via INTRAVENOUS
  Filled 2024-09-17 (×2): qty 100

## 2024-09-17 MED ORDER — MIDAZOLAM HCL (PF) 2 MG/2ML IJ SOLN
1.0000 mg | INTRAMUSCULAR | Status: DC
  Administered 2024-09-17: 2 mg via INTRAVENOUS
  Filled 2024-09-17: qty 2

## 2024-09-17 MED ORDER — CHLORHEXIDINE GLUCONATE 0.12 % MT SOLN
15.0000 mL | Freq: Once | OROMUCOSAL | Status: AC
  Administered 2024-09-17: 15 mL via OROMUCOSAL

## 2024-09-17 MED ORDER — METOCLOPRAMIDE HCL 5 MG/ML IJ SOLN: 5.0000 mg | Freq: Three times a day (TID) | INTRAMUSCULAR | Status: DC | PRN

## 2024-09-17 MED ORDER — HYDROMORPHONE HCL 1 MG/ML IJ SOLN: 0.5000 mg | INTRAMUSCULAR | Status: DC | PRN

## 2024-09-17 MED ORDER — BUPIVACAINE LIPOSOME 1.3 % IJ SUSP
INTRAMUSCULAR | Status: DC | PRN
  Administered 2024-09-17: 10 mL

## 2024-09-17 MED ORDER — ZOLPIDEM TARTRATE 5 MG PO TABS: 5.0000 mg | ORAL_TABLET | Freq: Every evening | ORAL | Status: DC | PRN

## 2024-09-17 MED ORDER — ACETAMINOPHEN 10 MG/ML IV SOLN: 1000.0000 mg | Freq: Once | INTRAVENOUS | Status: DC | PRN

## 2024-09-17 MED ORDER — IRBESARTAN 75 MG PO TABS
75.0000 mg | ORAL_TABLET | Freq: Every day | ORAL | Status: DC
  Administered 2024-09-18: 75 mg via ORAL
  Filled 2024-09-17: qty 1

## 2024-09-17 MED ORDER — ACETAMINOPHEN 500 MG PO TABS
1000.0000 mg | ORAL_TABLET | Freq: Once | ORAL | Status: AC
  Administered 2024-09-17: 1000 mg via ORAL
  Filled 2024-09-17: qty 2

## 2024-09-17 MED ORDER — SODIUM CHLORIDE 0.9 % IR SOLN
Status: DC | PRN
  Administered 2024-09-17: 3000 mL
  Administered 2024-09-17: 1000 mL

## 2024-09-17 MED ORDER — OXYCODONE HCL 5 MG PO TABS: 10.0000 mg | ORAL_TABLET | ORAL | Status: DC | PRN

## 2024-09-17 MED ORDER — TRANEXAMIC ACID-NACL 1000-0.7 MG/100ML-% IV SOLN
1000.0000 mg | INTRAVENOUS | Status: AC
  Administered 2024-09-17: 1000 mg via INTRAVENOUS
  Filled 2024-09-17: qty 100

## 2024-09-17 SURGICAL SUPPLY — 71 items
BAG COUNTER SPONGE SURGICOUNT (BAG) ×1 IMPLANT
BENZOIN TINCTURE PRP APPL 2/3 (GAUZE/BANDAGES/DRESSINGS) ×1 IMPLANT
BIT DRILL 3.2 PERIPHERAL SCREW (BIT) IMPLANT
BLADE SAW SGTL 73X25 THK (BLADE) ×1 IMPLANT
BLADE SURG 15 STRL LF DISP TIS (BLADE) ×1 IMPLANT
BLADE SURG SZ10 CARB STEEL (BLADE) ×1 IMPLANT
BNDG ESMARK 4X9 LF (GAUZE/BANDAGES/DRESSINGS) IMPLANT
BODY PROXIMAL PTC 11 132.5D (Spacer) IMPLANT
CAP LOCKING COCR (Cap) IMPLANT
CHLORAPREP W/TINT 26 (MISCELLANEOUS) ×2 IMPLANT
CLSR STERI-STRIP ANTIMIC 1/2X4 (GAUZE/BANDAGES/DRESSINGS) ×1 IMPLANT
CNTNR URN SCR LID CUP LEK RST (MISCELLANEOUS) IMPLANT
COOLER ICEMAN CLASSIC (MISCELLANEOUS) ×1 IMPLANT
COVER BACK TABLE 60X90IN (DRAPES) ×1 IMPLANT
COVER SURGICAL LIGHT HANDLE (MISCELLANEOUS) ×1 IMPLANT
DRAPE 3/4 80X56 (DRAPES) ×1 IMPLANT
DRAPE C-ARM 42X120 X-RAY (DRAPES) IMPLANT
DRAPE INCISE IOBAN 66X45 STRL (DRAPES) ×1 IMPLANT
DRAPE POUCH INSTRU U-SHP 10X18 (DRAPES) ×1 IMPLANT
DRAPE SHEET LG 3/4 BI-LAMINATE (DRAPES) ×2 IMPLANT
DRAPE SURG ORHT 6 SPLT 77X108 (DRAPES) ×2 IMPLANT
DRAPE TOP 10253 STERILE (DRAPES) ×1 IMPLANT
DRAPE U-SHAPE 47X51 STRL (DRAPES) ×1 IMPLANT
DRSG AQUACEL AG ADV 3.5X 6 (GAUZE/BANDAGES/DRESSINGS) ×1 IMPLANT
DRSG AQUACEL AG ADV 3.5X10 (GAUZE/BANDAGES/DRESSINGS) IMPLANT
DRSG XEROFORM 1X8 (GAUZE/BANDAGES/DRESSINGS) IMPLANT
ELECT BLADE TIP CTD 4 INCH (ELECTRODE) ×1 IMPLANT
ELECT PENCIL ROCKER SW 15FT (MISCELLANEOUS) ×1 IMPLANT
ELECT REM PT RETURN 15FT ADLT (MISCELLANEOUS) ×1 IMPLANT
FACESHIELD WRAPAROUND OR TEAM (MASK) ×3 IMPLANT
GAUZE SPONGE 4X4 12PLY STRL (GAUZE/BANDAGES/DRESSINGS) ×1 IMPLANT
GLOVE BIO SURGEON STRL SZ 6.5 (GLOVE) ×2 IMPLANT
GLOVE BIOGEL PI IND STRL 6.5 (GLOVE) ×1 IMPLANT
GLOVE BIOGEL PI IND STRL 8 (GLOVE) ×1 IMPLANT
GLOVE ECLIPSE 8.0 STRL XLNG CF (GLOVE) ×2 IMPLANT
GOWN STRL REUS W/ TWL LRG LVL3 (GOWN DISPOSABLE) ×2 IMPLANT
GUIDEWIRE GLENOID 2.5X220 (WIRE) IMPLANT
IMPL REVERSE SHOULDER 0X3.5 (Shoulder) IMPLANT
INSERT FLEX SHLD REV 39 +9 (Insert) IMPLANT
KIT BASIN OR (CUSTOM PROCEDURE TRAY) ×1 IMPLANT
KIT STABILIZATION SHOULDER (MISCELLANEOUS) ×1 IMPLANT
KIT TURNOVER KIT A (KITS) ×1 IMPLANT
MANIFOLD NEPTUNE II (INSTRUMENTS) ×1 IMPLANT
NS IRRIG 1000ML POUR BTL (IV SOLUTION) ×1 IMPLANT
OSTEOTOME THIN 8 3 (MISCELLANEOUS) IMPLANT
PACK SHOULDER (CUSTOM PROCEDURE TRAY) ×1 IMPLANT
PAD COLD SHLDR WRAP-ON (PAD) ×1 IMPLANT
RESTRAINT HEAD UNIVERSAL NS (MISCELLANEOUS) ×1 IMPLANT
SCREW ASSEMBLY FLEX SHLD (Screw) IMPLANT
SCREW REV AEQUALIS 20 (Screw) IMPLANT
SET HNDPC FAN SPRY TIP SCT (DISPOSABLE) ×1 IMPLANT
SLING ARM IMMOBILIZER LRG (SOFTGOODS) IMPLANT
SOLUTION PRONTOSAN WOUND 350ML (IRRIGATION / IRRIGATOR) IMPLANT
SPACER SHLD CMT AEQ 11X30 (Shoulder) IMPLANT
SPHERE GLENOD +3X39XLATERALIZE (Shoulder) IMPLANT
SPONGE T-LAP 4X18 ~~LOC~~+RFID (SPONGE) IMPLANT
STAPLER SKIN PROX 35W (STAPLE) IMPLANT
STEM PTC DISTAL 11 90 (Stem) IMPLANT
SUCTION TUBE FRAZIER 12FR DISP (SUCTIONS) IMPLANT
SUT 36 KAC-25 POLY 50 (SUTURE) IMPLANT
SUT ETHIBOND 2 V 37 (SUTURE) ×1 IMPLANT
SUT ETHIBOND NAB CT1 #1 30IN (SUTURE) ×1 IMPLANT
SUT ETHILON 3 0 PS 1 (SUTURE) IMPLANT
SUT MNCRL AB 4-0 PS2 18 (SUTURE) ×1 IMPLANT
SUT VIC AB 0 CT1 36 (SUTURE) IMPLANT
SUT VIC AB 3-0 SH 27X BRD (SUTURE) ×1 IMPLANT
SUT VICRYL+ 3-0 36IN CT-1 (SUTURE) IMPLANT
SUTURE FIBERWR #5 38 CONV NDL (SUTURE) ×2 IMPLANT
TOWEL OR DSP ST BLU DLX 10/PK (DISPOSABLE) ×1 IMPLANT
TUBE SUCTION HIGH CAP CLEAR NV (SUCTIONS) ×1 IMPLANT
WATER STERILE IRR 1000ML POUR (IV SOLUTION) ×2 IMPLANT

## 2024-09-17 NOTE — Progress Notes (Signed)
------------------------------------------  [facility] PROCEDURAL EXPEDITER NOTE-------------------------------------------------  Patient Name: Karina Allen Patient DOB: December 10, 1950 Today's Date: @TODAY @   Chart reviewed:  Yes  Documentation gaps:  Orders in place:  Yes  Communication with surgical team if no orders: n/a Labs, test, and orders reviewed: yes Requires surgical clearance:  No What type of clearance: n/a Clearance received: n/a Patient status:pre op   Barriers noted:n/a  Intervention provided by [facility] team: n/a Barrier resolved:  Yes   Ronal Bald, RN [employer]

## 2024-09-17 NOTE — Transfer of Care (Signed)
 Immediate Anesthesia Transfer of Care Note  Patient: Karina Allen  Procedure(s) Performed: REVISION, REVERSE TOTAL ARTHROPLASTY, SHOULDER (Left: Shoulder) OPEN REDUCTION INTERNAL FIXATION (ORIF) HUMERAL SHAFT FRACTURE (Left: Shoulder)  Patient Location: PACU  Anesthesia Type:General and Regional  Level of Consciousness: awake, alert , oriented, and patient cooperative  Airway & Oxygen Therapy: Patient Spontanous Breathing and Patient connected to face mask oxygen  Post-op Assessment: Report given to RN and Post -op Vital signs reviewed and stable  Post vital signs: Reviewed and stable  Last Vitals:  Vitals Value Taken Time  BP 118/54 09/17/24 16:13  Temp    Pulse 103 09/17/24 16:15  Resp 19 09/17/24 16:15  SpO2 95 % 09/17/24 16:15  Vitals shown include unfiled device data.  Last Pain:  Vitals:   09/17/24 1221  TempSrc:   PainSc: Asleep      Patients Stated Pain Goal: 3 (09/16/24 1130)  Complications: No notable events documented.

## 2024-09-17 NOTE — Anesthesia Preprocedure Evaluation (Addendum)
 Anesthesia Evaluation  Patient identified by MRN, date of birth, ID band Patient awake    Reviewed: Allergy & Precautions, NPO status , Patient's Chart, lab work & pertinent test results  History of Anesthesia Complications Negative for: history of anesthetic complications  Airway Mallampati: II  TM Distance: >3 FB Neck ROM: Full    Dental  (+) Teeth Intact, Dental Advisory Given   Pulmonary    breath sounds clear to auscultation       Cardiovascular hypertension, Pt. on medications + dysrhythmias (Incomplete RBBB)  Rhythm:Regular Rate:Normal  EKG (09/17/24): NSR with incomplete RBBB   Neuro/Psych  Headaches PSYCHIATRIC DISORDERS Anxiety Depression       GI/Hepatic ,,,(+) Hepatitis - (s/p Treatment as a child), B  Endo/Other  Hypothyroidism    Renal/GU      Musculoskeletal  (+) Arthritis , Osteoarthritis,    Abdominal   Peds  Hematology   Anesthesia Other Findings   Reproductive/Obstetrics Hx of L Breast Cancer s/p Lumpectomy                              Anesthesia Physical Anesthesia Plan  ASA: 3  Anesthesia Plan: General and Regional   Post-op Pain Management:    Induction: Intravenous  PONV Risk Score and Plan: 2 and Ondansetron , Dexamethasone  and Treatment may vary due to age or medical condition  Airway Management Planned: Oral ETT  Additional Equipment:   Intra-op Plan:   Post-operative Plan: Extubation in OR  Informed Consent:      Dental advisory given  Plan Discussed with: CRNA  Anesthesia Plan Comments:          Anesthesia Quick Evaluation

## 2024-09-17 NOTE — Anesthesia Procedure Notes (Signed)
 Anesthesia Regional Block: Interscalene brachial plexus block   Pre-Anesthetic Checklist: , timeout performed,  Correct Patient, Correct Site, Correct Laterality,  Correct Procedure, Correct Position, site marked,  Risks and benefits discussed,  At surgeon's request and post-op pain management  Laterality: Upper and Left  Prep: chloraprep       Needles:  Injection technique: Single-shot  Needle Type: Stimulator Needle - 40     Needle Length: 9cm  Needle Gauge: 22     Additional Needles:   Procedures:, nerve stimulator,,, ultrasound used (permanent image in chart),, #20gu IV placed     Nerve Stimulator or Paresthesia:  Response: Twitch elicited, 0.5 mA, 0.3 ms  Additional Responses:   Narrative:  Start time: 09/17/2024 11:54 AM End time: 09/17/2024 11:54 AM  Performed by: Personally  Anesthesiologist: Waddell Lauraine NOVAK, MD  Additional Notes: Block assessed prior to start of surgery

## 2024-09-17 NOTE — Interval H&P Note (Signed)
 All questions answered, patient wants to proceed with procedure. ? ?

## 2024-09-17 NOTE — Anesthesia Procedure Notes (Signed)
 Procedure Name: Intubation Date/Time: 09/17/2024 1:29 PM  Performed by: Dasie Nena PARAS, CRNAPre-anesthesia Checklist: Patient identified, Emergency Drugs available, Suction available, Patient being monitored and Timeout performed Patient Re-evaluated:Patient Re-evaluated prior to induction Oxygen Delivery Method: Circle system utilized Preoxygenation: Pre-oxygenation with 100% oxygen Induction Type: IV induction Ventilation: Mask ventilation without difficulty Laryngoscope Size: 3 and Mac Grade View: Grade I Tube type: Oral Tube size: 7.0 mm Number of attempts: 2 Airway Equipment and Method: Stylet Placement Confirmation: ETT inserted through vocal cords under direct vision, positive ETCO2 and breath sounds checked- equal and bilateral Secured at: 22 cm Tube secured with: Tape Dental Injury: Teeth and Oropharynx as per pre-operative assessment  Comments: DL x 1 by RN Joe from [facility].    Unable to pass endotube - no damage to teeth/lips/oropharynx noted.    Second DL by CRNA with successful intubation.

## 2024-09-17 NOTE — Plan of Care (Signed)
  Problem: Education: Goal: Knowledge of the prescribed therapeutic regimen will improve Outcome: Progressing   Problem: Activity: Goal: Ability to tolerate increased activity will improve Outcome: Progressing   Problem: Pain Management: Goal: Pain level will decrease with appropriate interventions Outcome: Progressing   

## 2024-09-18 ENCOUNTER — Encounter (HOSPITAL_COMMUNITY): Payer: Self-pay | Admitting: Orthopaedic Surgery

## 2024-09-18 LAB — HEMOGLOBIN AND HEMATOCRIT, BLOOD
HCT: 25 % — ABNORMAL LOW (ref 36.0–46.0)
Hemoglobin: 8.8 g/dL — ABNORMAL LOW (ref 12.0–15.0)

## 2024-09-18 MED ORDER — ACETAMINOPHEN 500 MG PO TABS: 1000.0000 mg | ORAL_TABLET | Freq: Three times a day (TID) | ORAL | 0 refills | Status: AC

## 2024-09-18 MED ORDER — OXYCODONE HCL 5 MG PO TABS: ORAL_TABLET | ORAL | 0 refills | Status: AC

## 2024-09-18 MED ORDER — ONDANSETRON HCL 4 MG PO TABS: 4.0000 mg | ORAL_TABLET | Freq: Three times a day (TID) | ORAL | 0 refills | Status: AC | PRN

## 2024-09-18 MED ORDER — CELECOXIB 100 MG PO CAPS: 100.0000 mg | ORAL_CAPSULE | Freq: Two times a day (BID) | ORAL | 0 refills | Status: AC

## 2024-09-18 MED ORDER — DOXYCYCLINE HYCLATE 100 MG PO CAPS: 100.0000 mg | ORAL_CAPSULE | Freq: Two times a day (BID) | ORAL | 0 refills | Status: AC

## 2024-09-18 MED ORDER — ASPIRIN 81 MG PO CHEW: 81.0000 mg | CHEWABLE_TABLET | Freq: Two times a day (BID) | ORAL | 0 refills | Status: AC

## 2024-09-18 NOTE — Plan of Care (Signed)
  Problem: Pain Managment: Goal: General experience of comfort will improve and/or be controlled Outcome: Progressing   Problem: Education: Goal: Knowledge of the prescribed therapeutic regimen will improve Outcome: Progressing Goal: Understanding of activity limitations/precautions following surgery will improve Outcome: Progressing   Problem: Activity: Goal: Ability to tolerate increased activity will improve Outcome: Progressing   Problem: Pain Management: Goal: Pain level will decrease with appropriate interventions Outcome: Progressing

## 2024-09-18 NOTE — Discharge Summary (Signed)
 Patient ID: Karina Allen MRN: [id number] DOB/AGE: [birth year] 73 y.o.  Admit date: 09/17/2024 Discharge date: 09/18/2024  Admission Diagnoses:Left periprosthetic proximal humerus fracture and humeral shaft fracture   Discharge Diagnoses:  Principal Problem:   Status post reverse total replacement of left shoulder   Past Medical History:  Diagnosis Date   Allergy 1955   Penicillin   Arthritis    osteo   Breast cancer (HCC) 03/20/2022   Left   Cataract 2010   Cataracts removed   Depression    Epiretinal membrane 02/05/2017   Sees Dr. Waylan and Dr. Raj   Essential hypertension 04/27/2014   FH: migraines    GAD (generalized anxiety disorder) 08/04/2015   Hepatitis B 1961   reports dx and tx as a young child, reports no issues since and retested as adult and told did not have hepatitis, she is unsure of type of hep she had   Hyperlipidemia    Hypothyroidism    Morbid obesity (HCC)    Personal history of radiation therapy    Pulmonary vascular congestion 09/15/2024   Noted on CXR   Thyroid  disease      Procedures Performed:  - Left revision reverse total Shoulder Arthroplasty - Open reduction internal fixation humeral shaft fracture - Glenohumeral synovectomy and synovial biopsy    Discharged Condition: stable  Hospital Course: Patient brought in for scheduled procedure.  Tolerated procedure well.  Was kept for monitoring overnight for pain control and medical monitoring postop and was found to be stable for DC home the morning after surgery.  Patient was instructed on specific activity restrictions and all questions were answered.   Opiates Today (MME): Today's  total administered Morphine Milligram Equivalents: 0 Opiates Yesterday (MME): Yesterday's total administered Morphine Milligram Equivalents: 45 Opiates Used in the last two days:  Inpatient Morphine Milligram Equivalents Per Day 11/13 - 11/14   Values displayed are in units of MME/Day    Order Start  / End Date Yesterday Today    oxyCODONE  (Oxy IR/ROXICODONE ) immediate release tablet 5 mg 11/13 - 11/13 0 of Unknown --    oxyCODONE  (ROXICODONE ) 5 MG/5ML solution 5 mg 11/13 - 11/13 0 of Unknown --      Group total: 0 of Unknown     fentaNYL  (SUBLIMAZE ) injection 50-100 mcg 11/13 - 11/13 30 of 15-30 --    fentaNYL  (SUBLIMAZE ) injection 25-50 mcg 11/13 - 11/13 0 of 45-90 --    fentaNYL  citrate (PF) (SUBLIMAZE ) injection 11/13 - 11/14 *15 of 15 --    HYDROmorphone (DILAUDID) injection 0.5-1 mg 11/13 - No end date 0 of 20-40 0 of 60-120    oxyCODONE  (Oxy IR/ROXICODONE ) immediate release tablet 5-10 mg 11/13 - No end date 0 of 15-30 0 of 45-90    oxyCODONE  (Oxy IR/ROXICODONE ) immediate release tablet 10-15 mg 11/13 - No end date 0 of 30-45 0 of 90-135    Daily Totals  * 45 of Unknown (at least 140-250) 0 of 195-345  *One-Step medication  Calculation Errors     Order Type Date Details   oxyCODONE  (Oxy IR/ROXICODONE ) immediate release tablet 5 mg Ordered Dose -- Insufficient frequency information   oxyCODONE  (ROXICODONE ) 5 MG/5ML solution 5 mg Ordered Dose -- Insufficient frequency information            Consults: None  Significant Diagnostic Studies: Cultures  Treatments: Surgery  Discharge Exam: Left shoulder: small amount of blood noted on aquacel,  sling well fitting,  full and painless ROM throughout  hand with DPC of 0. + Motor in  AIN, PIN, Ulnar distributions. Axillary nerve sensation preserved but sensation altered compared to other side.  Sensation intact in medial, radial, and ulnar distributions. Well perfused digits.    Disposition: Discharge disposition: 01-Home or Self Care       Discharge Instructions     Call MD for:  redness, tenderness, or signs of infection (pain, swelling, redness, odor or green/yellow discharge around incision site)   Complete by: As directed    Call MD for:  severe uncontrolled pain   Complete by: As directed    Call MD for:   temperature >100.4   Complete by: As directed    Diet - low sodium heart healthy   Complete by: As directed       Allergies as of 09/18/2024       Reactions   Penicillins Hives   Childhood Reaction  Received Ancef  on 03-20-2022 without issue        Medication List     STOP taking these medications    morphine 15 MG tablet Commonly known as: MSIR       TAKE these medications    acetaminophen  500 MG tablet Commonly known as: TYLENOL  Take 2 tablets (1,000 mg total) by mouth every 8 (eight) hours for 14 days.   anastrozole  1 MG tablet Commonly known as: ARIMIDEX  Take 1 tablet (1 mg total) by mouth daily.   aspirin  81 MG chewable tablet Commonly known as: Aspirin  Childrens Chew 1 tablet (81 mg total) by mouth 2 (two) times daily. For 6 weeks for DVT prophylaxis after surgery   CALCIUM PO Take 1 tablet by mouth daily at 12 noon.   carboxymethylcellulose 0.5 % Soln Commonly known as: REFRESH PLUS Place 1 drop into both eyes 3 (three) times daily as needed (dry eyes).   celecoxib 100 MG capsule Commonly known as: CeleBREX Take 1 capsule (100 mg total) by mouth 2 (two) times daily. For 2 weeks. Then take as needed   doxycycline 100 MG capsule Commonly known as: VIBRAMYCIN Take 1 capsule (100 mg total) by mouth 2 (two) times daily for 14 days.   hydrochlorothiazide  25 MG tablet Commonly known as: HYDRODIURIL  TAKE 1 TABLET BY MOUTH DAILY   olmesartan  5 MG tablet Commonly known as: BENICAR  TAKE 2 TABLETS BY MOUTH DAILY   ondansetron  4 MG tablet Commonly known as: Zofran  Take 1 tablet (4 mg total) by mouth every 8 (eight) hours as needed for up to 7 days for nausea or vomiting.   OVER THE COUNTER MEDICATION Nutradyn (multiple vitamins in a packet)   oxyCODONE  5 MG immediate release tablet Commonly known as: Oxy IR/ROXICODONE  Take 1-2 pills every 6 hrs as needed for severe pain, no more than 6 per day   RED YEAST RICE PO Take 2 capsules by mouth daily.    SM Vitamin D3 100 MCG (4000 UT) Caps Generic drug: Cholecalciferol Take 1 capsule by mouth daily. DV3 + Immune Support   Synthroid  175 MCG tablet Generic drug: levothyroxine  TAKE 1 TABLET BY MOUTH EVERY MORNING BEFORE BREAKFAST           Aleck Stalling, PA-C 09/18/24

## 2024-09-18 NOTE — Discharge Instructions (Signed)
 Bonner Hair MD, MPH Aleck Stalling, PA-C [facility] [address] [phone] (tel)   [phone] (fax)   POST-OPERATIVE INSTRUCTIONS - TOTAL SHOULDER REPLACEMENT    WOUND CARE You may leave the operative dressing in place until your follow-up appointment. KEEP THE INCISIONS CLEAN AND DRY. There may be a small amount of fluid/bleeding leaking at the surgical site. This is normal after surgery.  If it fills with liquid or blood please call us  immediately to change it for you. Use the provided ice machine or Ice packs as often as possible for the first 3-4 days, then as needed for pain relief.   Keep a layer of cloth or a shirt between your skin and the cooling unit to prevent frost bite as it can get very cold.  SHOWERING: - You may shower on Post-Op Day #2.  - The dressing is water resistant but do not scrub it as it may start to peel up.   - You may remove the sling for showering - Gently pat the area dry.  - Do not soak the shoulder in water.  - Do not go swimming in the pool or ocean until your incision has completely healed (about 4-6 weeks after surgery) - KEEP THE INCISIONS CLEAN AND DRY.  EXERCISES Wear the sling at all times  You may remove the sling for showering, but keep the arm across the chest or in a secondary sling.    Accidental/Purposeful External Rotation and shoulder flexion (reaching behind you) is to be avoided at all costs for the first month. It is ok to come out of your sling if your are sitting and have assistance for eating.   Do not lift anything heavier than 1 pound until we discuss it further in clinic.  It is normal for your fingers/hand to become more swollen after surgery and discolored from bruising.   This will resolve over the first few weeks usually after surgery. Please continue to ambulate and do not stay sitting or lying for too long.  Perform foot and wrist pumps to assist in circulation.  PHYSICAL  THERAPY - No therapy for 4 weeks  REGIONAL ANESTHESIA (NERVE BLOCKS) The anesthesia team may have performed a nerve block for you this is a great tool used to minimize pain.   The block may start wearing off overnight (between 8-24 hours postop) When the block wears off, your pain may go from nearly zero to the pain you would have had postop without the block. This is an abrupt transition but nothing dangerous is happening.   This can be a challenging period but utilize your as needed pain medications to try and manage this period. We suggest you use the pain medication the first night prior to going to bed, to ease this transition.  You may take an extra dose of narcotic when this happens if needed   POST-OP MEDICATIONS- Multimodal approach to pain control In general your pain will be controlled with a combination of substances.  Prescriptions unless otherwise discussed are electronically sent to your pharmacy.  This is a carefully made plan we use to minimize narcotic use.     Celebrex - Anti-inflammatory medication taken on a scheduled basis Acetaminophen  - Non-narcotic pain medicine taken on a scheduled basis  Oxycodone  - This is a strong narcotic, to be used only on an "as needed" basis for SEVERE pain. Aspirin  81mg  - This medicine is used to minimize the risk of blood clots after surgery.  Zofran  -  take as needed for nausea Doxycycline - this is an antibiotic, take as instructed  FOLLOW-UP If you develop a Fever (>101.5), Redness or Drainage from the surgical incision site, please call our office to arrange for an evaluation. Please call the office to schedule a follow-up appointment for a wound check, 7-10 days post-operatively.  IF YOU HAVE ANY QUESTIONS, PLEASE FEEL FREE TO CALL OUR OFFICE.  HELPFUL INFORMATION  Your arm will be in a sling following surgery. You will be in this sling for the next 4 weeks.   You may be more comfortable sleeping in a semi-seated position the  first few nights following surgery.  Keep a pillow propped under the elbow and forearm for comfort.  If you have a recliner type of chair it might be beneficial.  If not that is fine too, but it would be helpful to sleep propped up with pillows behind your operated shoulder as well under your elbow and forearm.  This will reduce pulling on the suture lines.  When dressing, put your operative arm in the sleeve first.  When getting undressed, take your operative arm out last.  Loose fitting, button-down shirts are recommended.  In most states it is against the law to drive while your arm is in a sling. And certainly against the law to drive while taking narcotics.  You may return to work/school in the next couple of days when you feel up to it. Desk work and typing in the sling is fine.  We suggest you use the pain medication the first night prior to going to bed, in order to ease any pain when the anesthesia wears off. You should avoid taking pain medications on an empty stomach as it will make you nauseous.  You should wean off your narcotic medicines as soon as you are able.     Most patients will be off narcotics before their first postop appointment.   Do not drink alcoholic beverages or take illicit drugs when taking pain medications.  Pain medication may make you constipated.  Below are a few solutions to try in this order: Decrease the amount of pain medication if you aren't having pain. Drink lots of decaffeinated fluids. Drink prune juice and/or each dried prunes  If the first 3 don't work start with additional solutions Take Colace - an over-the-counter stool softener Take Senokot - an over-the-counter laxative Take Miralax - a stronger over-the-counter laxative   Dental Antibiotics:  We require dental prophylaxis for 2 years after a shoulder replacement  Contact your surgeon for an antibiotic prescription, prior to your dental procedure.   For more information including  helpful videos and documents visit our website:   [email or web address]

## 2024-09-18 NOTE — Op Note (Signed)
 Orthopaedic Surgery Operative Note (CSN: [id number])  LARINE FIELDING  Sep 30, 1951 Date of Surgery: 09/17/2024   Diagnoses:  Left periprosthetic proximal humerus fracture and humeral shaft fracture  Procedure: Left revision reverse total Shoulder Arthroplasty [phone] Open reduction internal fixation humeral shaft fracture 24515 Glenohumeral synovectomy and synovial biopsy 23105   Operative Finding Successful completion of planned procedure.  Patient had extremely complex fracture with complete loss of fixation of the stem extension past the stem into the humeral shaft.  Based on this retain the stem was not possible.  We did remove the glenosphere and noted some fibrinous material that was relatively concerning in appearance.  I was able to irrigate and debride and perform a synovectomy to try and treat this as a one-stage arthroplasty.  We were able to place our stem but additionally had to fix the humeral shaft using multiple suture cerclage constructs adding extremely significant difficulty and time of the case.  We dissected directly on bone however the radial nerve and axillary nerve are in the field theoretically in the posterior aspect of the arm.  Without performing the cerclage patient would have had no major bone support of the proximal aspect of the stem.  I was quite happy with the final construct.  We had a relatively low suspicion of infection starting the case however I did take specimens based on the appearance of the material behind the glenosphere.  That said we will use the speed of her cultures potentially turning positive as an indicator of her likelihood of infection as C acnes is quite difficult to assess.  Additionally we will put her on suppressive antibiotics as she leaves the hospital.  Post-operative plan: The patient will be NWB in sling.  The patient will be will be admitted to observation due to medical complexity, monitoring and pain management.  DVT prophylaxis Aspirin   81 mg twice daily for 6 weeks.  Pain control with PRN pain medication preferring oral medicines.  Follow up plan will be scheduled in approximately 7 days for incision check and XR.  Physical therapy to start after 4 weeks.  Implants: Tornier revive 11 mm fully coated distal stem, a 20 and 30 mm spacer, 11 proximal body with associated screw and locking.  0 high offset tray with a 9 mm retentive polyethylene.  39+3 glenosphere.  Post-Op Diagnosis: Same Surgeons:Primary: Cristy Bonner DASEN, MD Assistants:[name] McBane, PA-C Location: [facility] ROOM 08 Anesthesia: General with Exparel  Interscalene Antibiotics: Ancef  2g preop, Vancomycin  1000mg  locally Tourniquet time: None Estimated Blood Loss: 200 Complications: None Specimens: 3 for culture, hold for 2 weeks to rule out C acnes Implants: Implant Name Type Inv. Item Serial No. Manufacturer Lot No. LRB No. Used Action  STEM HUMERAL SZ 3B LONG - [id number] Stem STEM HUMERAL 3B LONG 98 [id number] [employer]  Left 1 Explanted  INSERT HUMERAL 36X6MM 12.5DEG - [id number] Insert INSERT HUMERAL 36X6MM 12.5DEG [id number] [employer]  Left 1 Explanted  SPHERE GLENOID LAT REV 36 - [id number] Joint SPHERE GLENOID LAT REV 36 [id number] [employer]  Left 1 Explanted  SPHERE GLENOD +3X39XLATERALIZE - [id number] Shoulder SPHERE GLENOD +3X39XLATERALIZE [id number] [employer]  Left 1 Implanted  IMPLANT REVERSE SHOULDER 0X3.5 - [id number] Shoulder IMPL REVERSE SHOULDER 0X3.5 [id number] [employer]  Left 1 Explanted  CAP LOCKING COCR - [id number] Cap CAP LOCKING COCR [id number] [employer]  Left 1 Implanted  SCREW ASSEMBLY FLEX SHLD - [id number] Screw SCREW ASSEMBLY FLEX SHLD [id number] [employer]  Left 1 Implanted  STEM PTC  DISTAL 11 90 - [id number] Stem STEM PTC DISTAL 11 90 [id number] [employer]  Left 1 Implanted  SPACER SHLD CMT AEQ 11X30 - [id number] Shoulder SPACER SHLD CMT AEQ 11X30 [id number] [employer]  Left 1 Implanted  SCREW  REV AEQUALIS 20 - [id number] Screw SCREW REV AEQUALIS 20 [id number] [employer]  Left 1 Implanted  BODY PROXIMAL PTC 11 132.5D - [id number] Spacer BODY PROXIMAL PTC 11 132.5D [id number] [employer]  Left 1 Implanted  INSERT FLEX SHLD REV 39 +9 - [id number] Insert INSERT FLEX SHLD REV 39 +9 [id number] [employer]  Left 1 Implanted  IMPL REVERSE SHOULDER 0X3.5 - [id number] Shoulder IMPL REVERSE SHOULDER 0X3.5 [id number] [employer]  Left 1 Implanted    Indications for Surgery:   TIARI ANDRINGA is a 73 y.o. female with fall after previous reverse total shoulder arthroplasty that had been doing well..  Benefits and risks of operative and nonoperative management were discussed prior to surgery with patient/guardian(s) and informed consent form was completed.  Infection and need for further surgery were discussed as was prosthetic stability and cuff issues.  We additionally specifically discussed risks of axillary nerve injury, infection, periprosthetic fracture, continued pain and longevity of implants prior to beginning procedure.      Procedure:   The patient was identified in the preoperative holding area where the surgical site was marked. Block placed by anesthesia with exparel .  The patient was taken to the OR where a procedural timeout was called and the above noted anesthesia was induced.  The patient was positioned beachchair on allen table with spider arm positioner.  Preoperative antibiotics were dosed.  The patient's left shoulder was prepped and draped in the usual sterile fashion.  A second preoperative timeout was called.       We began by extending the deltopectoral incision down the length of the arm about an extra 8 to 10 cm.  We went through skin sharply achieving hemostasis progress.  We identified the previous sutures in the deltopectoral interval and were able to open the deltopectoral interval and extend the deep dissection staying lateral to the biceps and then medial to the  deltoid itself.  We were then able to open the fascia around the deeper layers and then peel from lateral to medial directly and down elevating a flap of tissue including the remnant of the subscapularis as well as part of the pectoralis.  Some of this was attached to clear fracture fragments.  I started take assessment of our fracture fragments themselves.  There is a complex fracture extending far past the tip of the stem.  The stem itself was completely loose and with the exception of a small amount of bone that the remnant of the cuff was attached to we were able to withdraw the stem without issue.  I did use an osteotome to remove the small amount of bone.  The polyethylene was completely intact.  There is no sign of infection here.  To perform synovectomy we cleared any tissue around the perimeter of the stem as well as in the synovium around the joint.  I removed the glenosphere and noted some fibrinous material behind the glenosphere.  We irrigated that copiously and checked that all of our screws in the baseplate were appropriate.  We then placed a 39+3 glenosphere upsizing to avoid instability.  We turned our attention back to the humerus.  We exposed the distalmost fragment and sized it for an 11 fully coated revive.  I then  was able to identify all the fracture fragments and carefully dissected the right ankle directly on bone around the posterior aspect of the fragments using palpation and direct visualization to pass 3 Nice loop sutures in the form of #5 braided suture from [employer].  Each of these would eventually form a cerclage.  We then were able to prepare implant on the back table.  We have trialed and noted a reasonable size and tension relationship with our construct.  The final implant was tightened by myself and finally checked and then placed in without issue and 20 degrees of retroversion.  We reduced the joint and then proceeded with the open reduction total fixation of the humeral  shaft.  A previously placed sutures that were again directly on bone were brought around and were able to use clamps to reduce the humeral shaft around her stem.  Was able to tie each of these using a Nice loop suture technique and obtain good bone to bone apposition.  We tied alternating half hitches to secure them.  Final construct was robust.  We irrigated copiously placed local vancomycin  powder and closed incision in multilayer fashion fashion with absorbable nylon.  Sling was placed.  Patient was awoken and taken to PACU in stable condition.   Aleck Stalling, PA-C, present and scrubbed throughout the case, critical for completion in a timely fashion, and for retraction, instrumentation, closure.

## 2024-09-18 NOTE — Anesthesia Postprocedure Evaluation (Signed)
 Anesthesia Post Note  Patient: Karina Allen  Procedure(s) Performed: REVISION, REVERSE TOTAL ARTHROPLASTY, SHOULDER (Left: Shoulder) OPEN REDUCTION INTERNAL FIXATION (ORIF) HUMERAL SHAFT FRACTURE (Left: Shoulder)     Patient location during evaluation: PACU Anesthesia Type: Regional and General Level of consciousness: awake Pain management: pain level controlled Vital Signs Assessment: post-procedure vital signs reviewed and stable Respiratory status: spontaneous breathing, nonlabored ventilation and respiratory function stable Cardiovascular status: blood pressure returned to baseline and stable Postop Assessment: no apparent nausea or vomiting Anesthetic complications: no   No notable events documented.                  Delon Aisha Arch

## 2024-09-18 NOTE — Progress Notes (Signed)
 Patient discharged to home. DC instructions given with daughter at bedside. Verbalized no concerns. Pt and daughter reminded to stop by pharmacy and pickup medications that were e-prescribed. Verbalized understanding . Pt left unit in wheelchair pushed by this clinical research associate accompanied by Runner, Broadcasting/film/video. Left in stable condition.

## 2024-09-25 DIAGNOSIS — Z96612 Presence of left artificial shoulder joint: Secondary | ICD-10-CM | POA: Diagnosis not present

## 2024-10-01 LAB — AEROBIC/ANAEROBIC CULTURE W GRAM STAIN (SURGICAL/DEEP WOUND)
Culture: NO GROWTH
Culture: NO GROWTH
Culture: NO GROWTH
Gram Stain: NONE SEEN
Gram Stain: NONE SEEN

## 2024-10-06 DIAGNOSIS — S42202A Unspecified fracture of upper end of left humerus, initial encounter for closed fracture: Secondary | ICD-10-CM | POA: Diagnosis not present

## 2024-10-27 ENCOUNTER — Other Ambulatory Visit: Payer: Self-pay | Admitting: Internal Medicine

## 2024-10-27 DIAGNOSIS — E038 Other specified hypothyroidism: Secondary | ICD-10-CM

## 2024-10-27 DIAGNOSIS — I10 Essential (primary) hypertension: Secondary | ICD-10-CM

## 2025-03-09 ENCOUNTER — Encounter: Admitting: Internal Medicine

## 2025-06-04 ENCOUNTER — Ambulatory Visit: Admitting: Hematology and Oncology
